# Patient Record
Sex: Female | Born: 1990 | Race: White | Hispanic: No | Marital: Single | Smoking: Former smoker
Health system: Southern US, Community
[De-identification: ages and names within clinical notes are randomized; demographics above are authoritative.]

## PROBLEM LIST (undated history)

## (undated) DIAGNOSIS — G7000 Myasthenia gravis without (acute) exacerbation: Secondary | ICD-10-CM

---

## 2020-05-16 ENCOUNTER — Inpatient Hospital Stay (HOSPITAL_COMMUNITY)
Admission: EM | Admit: 2020-05-16 | Discharge: 2020-05-22 | DRG: 056 | Disposition: A | Discharge status: Home Health | Payer: 59 | Attending: Internal Medicine | Admitting: Internal Medicine

## 2020-05-16 ENCOUNTER — Inpatient Hospital Stay (HOSPITAL_COMMUNITY): Payer: 59

## 2020-05-16 ENCOUNTER — Emergency Department (HOSPITAL_COMMUNITY): Payer: 59

## 2020-05-16 ENCOUNTER — Encounter (HOSPITAL_COMMUNITY): Payer: Self-pay

## 2020-05-16 DIAGNOSIS — J45909 Unspecified asthma, uncomplicated: Secondary | ICD-10-CM | POA: Diagnosis present

## 2020-05-16 DIAGNOSIS — I498 Other specified cardiac arrhythmias: Secondary | ICD-10-CM | POA: Diagnosis present

## 2020-05-16 DIAGNOSIS — K449 Diaphragmatic hernia without obstruction or gangrene: Secondary | ICD-10-CM | POA: Diagnosis present

## 2020-05-16 DIAGNOSIS — E46 Unspecified protein-calorie malnutrition: Secondary | ICD-10-CM | POA: Insufficient documentation

## 2020-05-16 DIAGNOSIS — Z8249 Family history of ischemic heart disease and other diseases of the circulatory system: Secondary | ICD-10-CM

## 2020-05-16 DIAGNOSIS — Z9109 Other allergy status, other than to drugs and biological substances: Secondary | ICD-10-CM

## 2020-05-16 DIAGNOSIS — Z881 Allergy status to other antibiotic agents status: Secondary | ICD-10-CM | POA: Diagnosis not present

## 2020-05-16 DIAGNOSIS — K580 Irritable bowel syndrome with diarrhea: Secondary | ICD-10-CM | POA: Diagnosis present

## 2020-05-16 DIAGNOSIS — K3184 Gastroparesis: Secondary | ICD-10-CM | POA: Diagnosis present

## 2020-05-16 DIAGNOSIS — K5900 Constipation, unspecified: Secondary | ICD-10-CM | POA: Diagnosis present

## 2020-05-16 DIAGNOSIS — E43 Unspecified severe protein-calorie malnutrition: Secondary | ICD-10-CM | POA: Diagnosis present

## 2020-05-16 DIAGNOSIS — Z87891 Personal history of nicotine dependence: Secondary | ICD-10-CM

## 2020-05-16 DIAGNOSIS — Z882 Allergy status to sulfonamides status: Secondary | ICD-10-CM

## 2020-05-16 DIAGNOSIS — M532X2 Spinal instabilities, cervical region: Secondary | ICD-10-CM | POA: Diagnosis present

## 2020-05-16 DIAGNOSIS — G7001 Myasthenia gravis with (acute) exacerbation: Principal | ICD-10-CM | POA: Diagnosis present

## 2020-05-16 DIAGNOSIS — M2669 Other specified disorders of temporomandibular joint: Secondary | ICD-10-CM | POA: Diagnosis present

## 2020-05-16 DIAGNOSIS — Z681 Body mass index (BMI) 19 or less, adult: Secondary | ICD-10-CM

## 2020-05-16 DIAGNOSIS — Z888 Allergy status to other drugs, medicaments and biological substances status: Secondary | ICD-10-CM

## 2020-05-16 DIAGNOSIS — R109 Unspecified abdominal pain: Secondary | ICD-10-CM

## 2020-05-16 DIAGNOSIS — R64 Cachexia: Secondary | ICD-10-CM | POA: Diagnosis present

## 2020-05-16 DIAGNOSIS — R1084 Generalized abdominal pain: Secondary | ICD-10-CM | POA: Diagnosis not present

## 2020-05-16 DIAGNOSIS — R5383 Other fatigue: Secondary | ICD-10-CM | POA: Diagnosis present

## 2020-05-16 DIAGNOSIS — Z79899 Other long term (current) drug therapy: Secondary | ICD-10-CM

## 2020-05-16 DIAGNOSIS — Z20822 Contact with and (suspected) exposure to covid-19: Secondary | ICD-10-CM | POA: Diagnosis present

## 2020-05-16 DIAGNOSIS — Z975 Presence of (intrauterine) contraceptive device: Secondary | ICD-10-CM

## 2020-05-16 DIAGNOSIS — N319 Neuromuscular dysfunction of bladder, unspecified: Secondary | ICD-10-CM | POA: Diagnosis present

## 2020-05-16 DIAGNOSIS — K219 Gastro-esophageal reflux disease without esophagitis: Secondary | ICD-10-CM | POA: Diagnosis present

## 2020-05-16 DIAGNOSIS — G7000 Myasthenia gravis without (acute) exacerbation: Secondary | ICD-10-CM

## 2020-05-16 DIAGNOSIS — Q7962 Hypermobile Ehlers-Danlos syndrome: Secondary | ICD-10-CM | POA: Diagnosis not present

## 2020-05-16 DIAGNOSIS — E876 Hypokalemia: Secondary | ICD-10-CM | POA: Diagnosis present

## 2020-05-16 DIAGNOSIS — T829XXA Unspecified complication of cardiac and vascular prosthetic device, implant and graft, initial encounter: Secondary | ICD-10-CM

## 2020-05-16 DIAGNOSIS — F4310 Post-traumatic stress disorder, unspecified: Secondary | ICD-10-CM | POA: Diagnosis present

## 2020-05-16 DIAGNOSIS — Z886 Allergy status to analgesic agent status: Secondary | ICD-10-CM

## 2020-05-16 DIAGNOSIS — Z885 Allergy status to narcotic agent status: Secondary | ICD-10-CM | POA: Diagnosis not present

## 2020-05-16 DIAGNOSIS — Z4902 Encounter for fitting and adjustment of peritoneal dialysis catheter: Secondary | ICD-10-CM

## 2020-05-16 DIAGNOSIS — G903 Multi-system degeneration of the autonomic nervous system: Secondary | ICD-10-CM | POA: Diagnosis present

## 2020-05-16 DIAGNOSIS — D8940 Mast cell activation, unspecified: Secondary | ICD-10-CM | POA: Diagnosis present

## 2020-05-16 DIAGNOSIS — G43909 Migraine, unspecified, not intractable, without status migrainosus: Secondary | ICD-10-CM | POA: Diagnosis present

## 2020-05-16 DIAGNOSIS — Z931 Gastrostomy status: Secondary | ICD-10-CM

## 2020-05-16 DIAGNOSIS — Z91048 Other nonmedicinal substance allergy status: Secondary | ICD-10-CM

## 2020-05-16 DIAGNOSIS — Z452 Encounter for adjustment and management of vascular access device: Secondary | ICD-10-CM

## 2020-05-16 DIAGNOSIS — G901 Familial dysautonomia [Riley-Day]: Secondary | ICD-10-CM | POA: Diagnosis present

## 2020-05-16 DIAGNOSIS — Z832 Family history of diseases of the blood and blood-forming organs and certain disorders involving the immune mechanism: Secondary | ICD-10-CM

## 2020-05-16 DIAGNOSIS — H02403 Unspecified ptosis of bilateral eyelids: Secondary | ICD-10-CM | POA: Diagnosis present

## 2020-05-16 HISTORY — DX: Myasthenia gravis without (acute) exacerbation: G70.00

## 2020-05-16 LAB — BASIC METABOLIC PANEL
Anion gap: 9 (ref 5–15)
BUN: 10 mg/dL (ref 6–20)
CO2: 27 mmol/L (ref 22–32)
Calcium: 8.5 mg/dL — ABNORMAL LOW (ref 8.9–10.3)
Chloride: 107 mmol/L (ref 98–111)
Creatinine, Ser: 0.6 mg/dL (ref 0.44–1.00)
GFR, Estimated: >60 mL/min (ref >60)
Glucose, Bld: 97 mg/dL (ref 70–99)
Potassium: 3.4 mmol/L — ABNORMAL LOW (ref 3.5–5.1)
Sodium: 143 mmol/L (ref 135–145)

## 2020-05-16 LAB — CBC WITH DIFFERENTIAL/PLATELET
Abs Immature Granulocytes: 0.02 10*3/uL (ref 0.00–0.07)
Basophils Absolute: 0 10*3/uL (ref 0.0–0.1)
Basophils Relative: 0 %
Eosinophils Absolute: 0.1 10*3/uL (ref 0.0–0.5)
Eosinophils Relative: 1 %
HCT: 30.4 % — ABNORMAL LOW (ref 36.0–46.0)
Hemoglobin: 9.5 g/dL — ABNORMAL LOW (ref 12.0–15.0)
Immature Granulocytes: 0 %
Lymphocytes Relative: 25 %
Lymphs Abs: 1.3 10*3/uL (ref 0.7–4.0)
MCH: 27.8 pg (ref 26.0–34.0)
MCHC: 31.3 g/dL (ref 30.0–36.0)
MCV: 88.9 fL (ref 80.0–100.0)
Monocytes Absolute: 0.8 10*3/uL (ref 0.1–1.0)
Monocytes Relative: 16 %
Neutro Abs: 3 10*3/uL (ref 1.7–7.7)
Neutrophils Relative %: 58 %
Platelets: 168 10*3/uL (ref 150–400)
RBC: 3.42 MIL/uL — ABNORMAL LOW (ref 3.87–5.11)
RDW: 15.1 % (ref 11.5–15.5)
WBC: 5.2 10*3/uL (ref 4.0–10.5)
nRBC: 0 % (ref 0.0–0.2)

## 2020-05-16 LAB — URINALYSIS, ROUTINE W REFLEX MICROSCOPIC
Bilirubin Urine: NEGATIVE
Glucose, UA: NEGATIVE mg/dL
Hgb urine dipstick: NEGATIVE
Ketones, ur: NEGATIVE mg/dL
Leukocytes,Ua: NEGATIVE
Nitrite: NEGATIVE
Protein, ur: NEGATIVE mg/dL
Specific Gravity, Urine: 1.011 (ref 1.005–1.030)
pH: 8 (ref 5.0–8.0)

## 2020-05-16 LAB — RESP PANEL BY RT-PCR (FLU A&B, COVID) ARPGX2
Influenza A by PCR: NEGATIVE
Influenza B by PCR: NEGATIVE
SARS Coronavirus 2 by RT PCR: NEGATIVE

## 2020-05-16 LAB — HIV ANTIBODY (ROUTINE TESTING W REFLEX): HIV Screen 4th Generation wRfx: NONREACTIVE

## 2020-05-16 LAB — HEPATIC FUNCTION PANEL
ALT: 18 U/L (ref 0–44)
AST: 18 U/L (ref 15–41)
Albumin: 3.9 g/dL (ref 3.5–5.0)
Alkaline Phosphatase: 57 U/L (ref 38–126)
Bilirubin, Direct: 0.3 mg/dL — ABNORMAL HIGH (ref 0.0–0.2)
Indirect Bilirubin: 1.5 mg/dL — ABNORMAL HIGH (ref 0.3–0.9)
Total Bilirubin: 1.8 mg/dL — ABNORMAL HIGH (ref 0.3–1.2)
Total Protein: 5.9 g/dL — ABNORMAL LOW (ref 6.5–8.1)

## 2020-05-16 LAB — PHOSPHORUS: Phosphorus: 4.3 mg/dL (ref 2.5–4.6)

## 2020-05-16 LAB — GLUCOSE, CAPILLARY
Glucose-Capillary: 110 mg/dL — ABNORMAL HIGH (ref 70–99)
Glucose-Capillary: 83 mg/dL (ref 70–99)
Glucose-Capillary: 89 mg/dL (ref 70–99)
Glucose-Capillary: 72 mg/dL (ref 70–99)

## 2020-05-16 LAB — I-STAT BETA HCG BLOOD, ED (MC, WL, AP ONLY): I-stat hCG, quantitative: <5 m[IU]/mL (ref <5)

## 2020-05-16 LAB — CREATININE, SERUM
Creatinine, Ser: 0.63 mg/dL (ref 0.44–1.00)
GFR, Estimated: >60 mL/min (ref >60)

## 2020-05-16 LAB — MAGNESIUM: Magnesium: 2.1 mg/dL (ref 1.7–2.4)

## 2020-05-16 LAB — MRSA PCR SCREENING: MRSA by PCR: NEGATIVE

## 2020-05-16 LAB — HEMOGLOBIN A1C
Hgb A1c MFr Bld: 4.7 % — ABNORMAL LOW (ref 4.8–5.6)
Mean Plasma Glucose: 88.19 mg/dL

## 2020-05-16 MED ORDER — CROMOLYN SODIUM 20 MG/2ML IN NEBU
20.0000 mg | INHALATION_SOLUTION | Freq: Three times a day (TID) | RESPIRATORY_TRACT | Status: DC
  Administered 2020-05-16 – 2020-05-21 (×14): 20 mg via RESPIRATORY_TRACT
  Filled 2020-05-16 (×16): qty 2

## 2020-05-16 MED ORDER — LORAZEPAM 0.5 MG PO TABS
0.5000 mg | ORAL_TABLET | Freq: Four times a day (QID) | ORAL | Status: DC | PRN
  Administered 2020-05-19: 0.5 mg

## 2020-05-16 MED ORDER — HEPARIN SODIUM (PORCINE) 1000 UNIT/ML IJ SOLN
1000.0000 [IU] | Freq: Once | INTRAMUSCULAR | Status: AC
  Administered 2020-05-16: 1000 [IU]

## 2020-05-16 MED ORDER — SODIUM CHLORIDE 0.9 % IV BOLUS
1000.0000 mL | Freq: Once | INTRAVENOUS | Status: AC
  Administered 2020-05-16: 1000 mL via INTRAVENOUS

## 2020-05-16 MED ORDER — LORAZEPAM 0.5 MG PO TABS
0.5000 mg | ORAL_TABLET | Freq: Four times a day (QID) | ORAL | Status: DC | PRN
  Administered 2020-05-16 – 2020-05-20 (×9): 0.5 mg via ORAL
  Filled 2020-05-16 (×10): qty 1

## 2020-05-16 MED ORDER — SODIUM CHLORIDE 0.9 % IV SOLN
Freq: Once | INTRAVENOUS | Status: DC
  Filled 2020-05-16: qty 200

## 2020-05-16 MED ORDER — DIPHENHYDRAMINE HCL 50 MG/ML IJ SOLN: 25.0000 mg | Freq: Four times a day (QID) | INTRAMUSCULAR | Status: DC | PRN

## 2020-05-16 MED ORDER — ONDANSETRON HCL 4 MG/2ML IJ SOLN
INTRAMUSCULAR | Status: AC
  Administered 2020-05-16: 4 mg via INTRAVENOUS
  Filled 2020-05-16: qty 4

## 2020-05-16 MED ORDER — HEPARIN SODIUM (PORCINE) 1000 UNIT/ML IJ SOLN: 1000.0000 [IU] | Freq: Once | INTRAMUSCULAR | Status: DC

## 2020-05-16 MED ORDER — LORAZEPAM 0.5 MG PO TABS
0.5000 mg | ORAL_TABLET | Freq: Four times a day (QID) | ORAL | Status: DC | PRN
  Filled 2020-05-16: qty 1

## 2020-05-16 MED ORDER — BACLOFEN 10 MG PO TABS
10.0000 mg | ORAL_TABLET | Freq: Four times a day (QID) | ORAL | Status: DC | PRN
  Administered 2020-05-17 – 2020-05-18 (×4): 10 mg via ORAL
  Filled 2020-05-16 (×3): qty 1

## 2020-05-16 MED ORDER — CROMOLYN SODIUM 100 MG/5ML PO CONC
200.0000 mg | ORAL | Status: DC
  Administered 2020-05-16: 200 mg via ORAL

## 2020-05-16 MED ORDER — LORAZEPAM 0.5 MG PO TABS
0.5000 mg | ORAL_TABLET | Freq: Four times a day (QID) | ORAL | Status: DC | PRN
  Administered 2020-05-16: 0.5 mg

## 2020-05-16 MED ORDER — INSULIN ASPART 100 UNIT/ML ~~LOC~~ SOLN: 0.0000 [IU] | SUBCUTANEOUS | Status: DC

## 2020-05-16 MED ORDER — MONTELUKAST SODIUM 10 MG PO TABS
10.0000 mg | ORAL_TABLET | Freq: Every day | ORAL | Status: DC
  Filled 2020-05-16: qty 1

## 2020-05-16 MED ORDER — POLYETHYLENE GLYCOL 3350 17 G PO PACK: 17.0000 g | PACK | Freq: Every day | ORAL | Status: DC | PRN

## 2020-05-16 MED ORDER — KETOTIFEN FUMARATE 0.025 % OP SOLN
1.0000 [drp] | Freq: Every day | OPHTHALMIC | Status: DC | PRN
  Administered 2020-05-18: 1 [drp] via OPHTHALMIC
  Filled 2020-05-16: qty 5

## 2020-05-16 MED ORDER — EPINEPHRINE 0.3 MG/0.3ML IJ SOAJ
0.3000 mg | INTRAMUSCULAR | Status: DC | PRN
  Filled 2020-05-16: qty 0.6

## 2020-05-16 MED ORDER — CALCIUM GLUCONATE 10 % IV SOLN
2.0000 g | Freq: Once | INTRAVENOUS | Status: DC
  Filled 2020-05-16: qty 20

## 2020-05-16 MED ORDER — MEPIVACAINE HCL (PF) 1 % IJ SOLN: 15.0000 mL | Freq: Once | INTRAMUSCULAR | Status: DC

## 2020-05-16 MED ORDER — FLUTICASONE-SALMETEROL 250-50 MCG/DOSE IN AEPB
1.0000 | INHALATION_SPRAY | Freq: Two times a day (BID) | RESPIRATORY_TRACT | Status: DC
  Administered 2020-05-17 – 2020-05-21 (×9): 1 via RESPIRATORY_TRACT
  Filled 2020-05-16: qty 14

## 2020-05-16 MED ORDER — ACD FORMULA A 0.73-2.45-2.2 GM/100ML VI SOLN
Status: AC
  Administered 2020-05-16: 1000 mL
  Filled 2020-05-16: qty 500

## 2020-05-16 MED ORDER — ZAFIRLUKAST 20 MG PO TABS
20.0000 mg | ORAL_TABLET | Freq: Two times a day (BID) | ORAL | Status: DC
  Administered 2020-05-17 – 2020-05-21 (×10): 20 mg via ORAL
  Filled 2020-05-16 (×10): qty 1

## 2020-05-16 MED ORDER — LORAZEPAM 2 MG/ML IJ SOLN
INTRAMUSCULAR | Status: AC
  Administered 2020-05-16: 0.5 mg
  Filled 2020-05-16: qty 1

## 2020-05-16 MED ORDER — DOCUSATE SODIUM 100 MG PO CAPS: 100.0000 mg | ORAL_CAPSULE | Freq: Two times a day (BID) | ORAL | Status: DC | PRN

## 2020-05-16 MED ORDER — ACETAMINOPHEN 325 MG PO TABS: 650.0000 mg | ORAL_TABLET | ORAL | Status: DC | PRN

## 2020-05-16 MED ORDER — LORAZEPAM 0.5 MG PO TABS: 0.5000 mg | ORAL_TABLET | Freq: Four times a day (QID) | ORAL | Status: DC | PRN

## 2020-05-16 MED ORDER — PYRIDOSTIGMINE BROMIDE 60 MG PO TABS
120.0000 mg | ORAL_TABLET | ORAL | Status: DC
  Administered 2020-05-16: 120 mg via ORAL

## 2020-05-16 MED ORDER — CALCIUM GLUCONATE 10 % IV SOLN
3.0000 g | Freq: Once | INTRAVENOUS | Status: DC
  Filled 2020-05-16: qty 30

## 2020-05-16 MED ORDER — ACD FORMULA A 0.73-2.45-2.2 GM/100ML VI SOLN
1000.0000 mL | Status: DC
  Filled 2020-05-16: qty 1000

## 2020-05-16 MED ORDER — SODIUM CHLORIDE 0.9 % IV SOLN
3.0000 g | Freq: Once | INTRAVENOUS | Status: AC
  Administered 2020-05-16: 3 g via INTRAVENOUS
  Filled 2020-05-16: qty 30

## 2020-05-16 MED ORDER — PYRIDOSTIGMINE BROMIDE 60 MG PO TABS: 120.0000 mg | ORAL_TABLET | ORAL | Status: DC

## 2020-05-16 MED ORDER — BUDESONIDE 0.5 MG/2ML IN SUSP
0.5000 mg | Freq: Two times a day (BID) | RESPIRATORY_TRACT | Status: DC
  Administered 2020-05-16 – 2020-05-19 (×4): 0.5 mg via RESPIRATORY_TRACT
  Filled 2020-05-16 (×9): qty 2

## 2020-05-16 MED ORDER — VITAMIN B-12 1000 MCG PO TABS
1000.0000 ug | ORAL_TABLET | Freq: Every day | ORAL | Status: DC
  Filled 2020-05-16 (×2): qty 1

## 2020-05-16 MED ORDER — FAMOTIDINE IN NACL 20-0.9 MG/50ML-% IV SOLN
20.0000 mg | Freq: Two times a day (BID) | INTRAVENOUS | Status: DC
  Administered 2020-05-16 – 2020-05-17 (×2): 20 mg via INTRAVENOUS
  Filled 2020-05-16 (×2): qty 50

## 2020-05-16 MED ORDER — PYRIDOSTIGMINE BROMIDE 60 MG PO TABS
90.0000 mg | ORAL_TABLET | ORAL | Status: DC
  Administered 2020-05-16 – 2020-05-21 (×43): 90 mg via ORAL
  Filled 2020-05-16 (×42): qty 1.5

## 2020-05-16 MED ORDER — CROMOLYN SODIUM 5.2 MG/ACT NA AERS
1.0000 | INHALATION_SPRAY | Freq: Three times a day (TID) | NASAL | Status: DC | PRN
  Filled 2020-05-16: qty 26

## 2020-05-16 MED ORDER — ACETAMINOPHEN 325 MG PO TABS
650.0000 mg | ORAL_TABLET | ORAL | Status: DC | PRN
  Filled 2020-05-16: qty 2

## 2020-05-16 MED ORDER — HEPARIN SODIUM (PORCINE) 5000 UNIT/ML IJ SOLN
5000.0000 [IU] | Freq: Three times a day (TID) | INTRAMUSCULAR | Status: DC
  Administered 2020-05-17: 5000 [IU] via SUBCUTANEOUS
  Filled 2020-05-16 (×6): qty 1

## 2020-05-16 MED ORDER — DIPHENHYDRAMINE-ZINC ACETATE 2-0.1 % EX CREA
1.0000 "application " | TOPICAL_CREAM | Freq: Every day | CUTANEOUS | Status: DC | PRN
  Filled 2020-05-16: qty 28

## 2020-05-16 MED ORDER — DIPHENHYDRAMINE HCL 25 MG PO CAPS: 25.0000 mg | ORAL_CAPSULE | Freq: Four times a day (QID) | ORAL | Status: DC | PRN

## 2020-05-16 MED ORDER — PYRIDOSTIGMINE BROMIDE 60 MG PO TABS
90.0000 mg | ORAL_TABLET | ORAL | Status: DC
  Filled 2020-05-16 (×3): qty 1.5

## 2020-05-16 MED ORDER — MEPIVACAINE HCL (PF) 2 % IJ SOLN
10.0000 mL | Freq: Once | INTRAMUSCULAR | Status: AC
  Administered 2020-05-16: 10 mL
  Filled 2020-05-16: qty 20

## 2020-05-16 MED ORDER — IPRATROPIUM-ALBUTEROL 0.5-2.5 (3) MG/3ML IN SOLN
3.0000 mL | Freq: Four times a day (QID) | RESPIRATORY_TRACT | Status: DC
  Administered 2020-05-16 – 2020-05-17 (×2): 3 mL via RESPIRATORY_TRACT
  Filled 2020-05-16 (×3): qty 3

## 2020-05-16 MED ORDER — CETIRIZINE HCL 10 MG PO TABS
10.0000 mg | ORAL_TABLET | Freq: Two times a day (BID) | ORAL | Status: DC
  Filled 2020-05-16: qty 1

## 2020-05-16 MED ORDER — CALCIUM GLUCONATE-NACL 2-0.675 GM/100ML-% IV SOLN
2.0000 g | Freq: Once | INTRAVENOUS | Status: DC
  Filled 2020-05-16: qty 100

## 2020-05-16 MED ORDER — SODIUM CHLORIDE 0.9 % IV SOLN
3.0000 g | Freq: Once | INTRAVENOUS | Status: DC
  Filled 2020-05-16: qty 30

## 2020-05-16 MED ORDER — SODIUM CHLORIDE 0.9 % IV SOLN
INTRAVENOUS | Status: AC
  Filled 2020-05-16 (×3): qty 200

## 2020-05-16 MED ORDER — CROMOLYN SODIUM 100 MG/5ML PO CONC
200.0000 mg | Freq: Three times a day (TID) | ORAL | Status: DC
  Administered 2020-05-16 – 2020-05-18 (×10): 200 mg via ORAL
  Filled 2020-05-16 (×10): qty 10

## 2020-05-16 MED ORDER — IVABRADINE HCL 5 MG PO TABS
5.0000 mg | ORAL_TABLET | Freq: Two times a day (BID) | ORAL | Status: DC
  Administered 2020-05-17 – 2020-05-21 (×10): 5 mg via ORAL
  Filled 2020-05-16 (×12): qty 1

## 2020-05-16 MED ORDER — BACLOFEN 10 MG PO TABS
10.0000 mg | ORAL_TABLET | Freq: Three times a day (TID) | ORAL | Status: DC | PRN
  Administered 2020-05-16 (×2): 10 mg via ORAL
  Filled 2020-05-16 (×3): qty 1

## 2020-05-16 MED ORDER — PYRIDOSTIGMINE BROMIDE 60 MG PO TABS
157.5000 mg | ORAL_TABLET | Freq: Once | ORAL | Status: AC
  Administered 2020-05-16: 150 mg via ORAL
  Filled 2020-05-16: qty 2.5

## 2020-05-16 MED ORDER — ONDANSETRON HCL 4 MG/2ML IJ SOLN
4.0000 mg | Freq: Four times a day (QID) | INTRAMUSCULAR | Status: DC | PRN
  Administered 2020-05-17: 4 mg via INTRAVENOUS
  Filled 2020-05-16: qty 2

## 2020-05-16 MED ORDER — VITAMIN B-12 100 MCG PO TABS: 100.0000 ug | ORAL_TABLET | Freq: Every day | ORAL | Status: DC

## 2020-05-16 MED ORDER — DIPHENHYDRAMINE HCL 25 MG PO CAPS
25.0000 mg | ORAL_CAPSULE | Freq: Four times a day (QID) | ORAL | Status: DC | PRN
  Administered 2020-05-18 – 2020-05-21 (×4): 25 mg via ORAL
  Filled 2020-05-16 (×2): qty 1

## 2020-05-16 MED ORDER — HEPARIN SODIUM (PORCINE) 1000 UNIT/ML IJ SOLN
INTRAMUSCULAR | Status: AC
  Filled 2020-05-16: qty 4

## 2020-05-16 MED ORDER — LORATADINE 10 MG PO TABS: 10.0000 mg | ORAL_TABLET | Freq: Every day | ORAL | Status: DC

## 2020-05-16 NOTE — H&P (Signed)
See consult note from today

## 2020-05-16 NOTE — Procedures (Signed)
Central Venous Catheter Insertion Procedure Note  Nazirah Tri  [id number]  03/17/1991  Date:05/16/20  Time:4:00 PM   Provider Performing:Taiten Brawn   Procedure: Insertion of Non-tunneled Central Venous Catheter(36556)with US guidance (83382)    Indication(s) Hemodialysis  Consent Risks of the procedure as well as the alternatives and risks of each were explained to the patient and/or caregiver.  Consent for the procedure was obtained and is signed in the bedside chart  Anesthesia Topical only with 1% lidocaine   Timeout Verified patient identification, verified procedure, site/side was marked, verified correct patient position, special equipment/implants available, medications/allergies/relevant history reviewed, required imaging and test results available.  Sterile Technique Maximal sterile technique including full sterile barrier drape, hand hygiene, sterile gown, sterile gloves, mask, hair covering, sterile ultrasound probe cover (if used).  Procedure Description Area of catheter insertion was cleaned with chlorhexidine and draped in sterile fashion.   With real-time ultrasound guidance a HD catheter was placed into the left subclavian vein.  Nonpulsatile blood flow and easy flushing noted in all ports.  The catheter was sutured in place and sterile dressing applied.  Complications/Tolerance None; patient tolerated the procedure well. Chest X-ray is ordered to verify placement for internal jugular or subclavian cannulation.  Chest x-ray is not ordered for femoral cannulation.  EBL Minimal  Specimen(s) None

## 2020-05-16 NOTE — Progress Notes (Signed)
Patient very upset and agitated that her medications are being stored in pharmacy. PTA medication policy explained to patient at time of medication inventory earlier this evening, witnessed by HD nurse M. Herriott, Charity fundraiser. Patient gave verbal consent for medication storage and stated that she could not physically sign as she felt too weak. Dual nurse signature to verify verbal consent can be found on medication receipt.  Aris Lot, RN

## 2020-05-16 NOTE — Progress Notes (Signed)
Patient to 4N17 at 1132, patient a/o x4 extremely anxious and tearful on arrival. Will inventory belongings after patient is stabilized. Patient refusing CHG bath and mepilex sacral foam, states that her skin is very sensitive and it will cause irritation. Patient requesting no scents (perfumes, scented shampoos, etc) in her room.  Aris Lot, RN

## 2020-05-16 NOTE — Progress Notes (Signed)
Pt refused to take the scheduled aerosol neb treatment at this time. No distress noted, SpO2 98% on 2L. NIF performed at this time -25 x3 attempts with good effort. RT will continue to monitor.

## 2020-05-16 NOTE — Progress Notes (Signed)
Pt removed mask for breathing treatment from face and liquid medication appears to be on bed. RT notified. Unsure how much medication patient actually received. RN will continue to monitor.   Pt is also refusing all medications because some are missing and they are off of her home schedule. She would like her home pill box to take them. This RN explained that pharmacy has reviewed her medications that were sent down and orders were placed accordingly. Pt states "I will not take anything until I talk to pharmacy." RN will continue to monitor

## 2020-05-16 NOTE — Progress Notes (Signed)
Pr is very emotional and it was hard giving her neb treatments. She did take some neb tx and I will try to finish it again. She does not want to do the NIF at all. Rt will try again. RN aware.

## 2020-05-16 NOTE — Progress Notes (Signed)
Patient with following belongings at bedside: One beanie hat One pair pants Two pair short sleeve button down shirts One long sleeve button down shirt One cell phone Two tablets One binder notebook One notebook Misc papers One respirator with hardshell black case One respirator with eye protection in hardshell black case One blue messenger bag Misc PTA medications, taken to pharmacy One eyeglass case, eyeglasses on patient  All belongings aside from PTA medications at bedside per patient request. Receipt for medications taken to pharmacy in patient chart.  Aris Lot, RN

## 2020-05-16 NOTE — ED Provider Notes (Signed)
[facility] EMERGENCY DEPARTMENT Provider Note   CSN: [id number] Arrival date & time: 05/16/20  1275     History Chief Complaint  Patient presents with  . Multiple Complaints  . Social Work    Brandy [name] is a 29 y.o. female.  HPI   29 year old female with a history of mast cell activation disorder, POTS syndrome, neurogenic orthostatic hypotension, chronic fatigue syndrome, hypermobile Ehlers-Danlos, TMJ dysfunction, carpal tunnel, scapular dyskinesis, asthma, IBS, vertigo, malabsorption, PTSD, GERD, hiatal hernia, migraines, neurogenic bladder, cervical spine instability, Gilbert's syndrome, myasthenia gravis, who presents to the emergency department today for evaluation of worsening myasthenia gravis symptoms.  States that she moved here 1 week ago from [place].  For the last month she has had increasing symptoms from her myasthenia including difficulty swallowing and difficulty with proximal muscle weakness.  Her physician recently started her on a course of steroids about 1 week ago which was later increased.  Initially steroids were helping symptoms however now they are not and it has become more difficult to swallow.  She reports increased fatigue and difficulty breathing.  She has never required intubation for her myasthenia in the past.  She was just diagnosed with this earlier this year.  Denies fevers, uri sxs, cough, chest pain, nausea vomiting or urinary sxs. Has had some alternating diarrhea and constipation but has h/o IBS.   Past Medical History:  Diagnosis Date  . Myasthenia gravis [facility])     Patient Active Problem List   Diagnosis Date Noted  . Myasthenia exacerbation (HCC) 05/16/2020    History reviewed. No pertinent surgical history.   OB History   No obstetric history on file.     No family history on file.  Social History   Tobacco Use  . Smoking status: Not on file  Substance Use Topics  . Alcohol use: Not on file  . Drug use:  Not on file    Home Medications Prior to Admission medications   Medication Sig Start Date End Date Taking? Authorizing Provider  baclofen (LIORESAL) 10 MG tablet Take 10 mg by mouth See admin instructions. Take one 10 mg tablet four times a day at 6AM, 12PM, 6PM, and 12AM   Yes [provider]  cromolyn (GASTROCROM) 100 MG/5ML solution Take 100 mg by mouth See admin instructions. 2 ampule in water taken 4 times a day before meals/bed and PRN   Yes [provider]  cromolyn (INTAL) 20 MG/2ML nebulizer solution Take 20 mg by nebulization See admin instructions. 1 vial taken 3 times a day and PRN   Yes [provider]  diphenhydrAMINE-zinc acetate (BENADRYL) cream Apply 1-2 application topically daily as needed (Rash from Mast Cell and Allergies).   Yes [provider]  famotidine (PEPCID) 40 MG tablet Take 40 mg by mouth See admin instructions. Take one 40 mg tablet twice a day at 6AM and 6PM.   Yes [provider]  ketotifen (ZADITOR) 0.025 % ophthalmic solution Place 1 drop into both eyes daily as needed (Mast cell, Allergies, MCS/EI).   Yes [provider]  LORazepam (ATIVAN) 0.5 MG tablet Take 0.5 mg by mouth every 6 (six) hours as needed for anxiety.   Yes [provider]  ondansetron (ZOFRAN-ODT) 8 MG disintegrating tablet Take 8 mg by mouth every 8 (eight) hours as needed for nausea or vomiting.   Yes [provider]    Allergies    Aspirin, Nsaids, Quinolones, Sulfa antibiotics, Telithromycin, Fludrocortisone, Ambien [zolpidem], Botulinum toxins,  Codeine, Creon [pancrelipase (lip-prot-amyl)], Cymbalta [duloxetine hcl], Dextrans, Ergotamine, Gabapentin, Gallamine, Hydrocodone, Lunesta [eszopiclone], Macrolides and ketolides, Maprotiline, Marplan [isocarboxazid], Midodrine, Morphine and related, Nitrous oxide, Oxycodone, Pethidine [meperidine], Phenergan [promethazine], Phenobarbital, Pyridium [phenazopyridine], Reglan  [metoclopramide], Remeron [mirtazapine], Savella [milnacipran], Scopolamine, Sumatriptan, Tape, Topiramate, Tricyclic antidepressants, and Vancomycin  Review of Systems   Review of Systems  Constitutional: Positive for fatigue. Negative for fever.  HENT: Positive for trouble swallowing. Negative for sore throat.   Eyes: Negative for visual disturbance.  Respiratory: Negative for cough and shortness of breath.   Cardiovascular: Negative for chest pain.  Gastrointestinal: Positive for diarrhea. Negative for abdominal pain, constipation, nausea and vomiting.  Genitourinary: Negative for dysuria and hematuria.  Musculoskeletal: Negative for back pain.  Skin: Negative for rash.  Neurological: Positive for speech difficulty and weakness. Negative for syncope.  All other systems reviewed and are negative.   Physical Exam Updated Vital Signs BP 131/87   Pulse (!) 59   Temp 97.9 F (36.6 C) (Oral)   Resp 16   Ht  (1.626 m)   Wt 49.9 kg   SpO2 100%   BMI 18.88 kg/m   Physical Exam Vitals and nursing note reviewed.  Constitutional:      General: She is not in acute distress.    Appearance: She is well-developed.     Comments: Chronically ill appearing  HENT:     Head: Normocephalic and atraumatic.  Eyes:     Conjunctiva/sclera: Conjunctivae normal.     Comments: Left eye ptosis  Neck:     Comments: In ccollar Cardiovascular:     Rate and Rhythm: Normal rate and regular rhythm.     Heart sounds: Normal heart sounds.  Pulmonary:     Effort: Pulmonary effort is normal. No respiratory distress.     Breath sounds: Normal breath sounds.  Abdominal:     General: Bowel sounds are normal.     Palpations: Abdomen is soft.     Tenderness: There is no abdominal tenderness.  Musculoskeletal:     Cervical back: Neck supple.  Skin:    General: Skin is warm and dry.  Neurological:     Mental Status: She is alert.     Comments: Mental Status:  Alert, thought content appropriate,  able to give a coherent history. Speech fluent without evidence of aphasia. Able to follow 2 step commands without difficulty.  Motor:  Normal tone. Globally decrease strength which is somewhat worse on the left (states left is weaker x1 month) Sensory: light touch normal in all extremities.      ED Results / Procedures / Treatments   Labs (all labs ordered are listed, but only abnormal results are displayed) Labs Reviewed  CBC WITH DIFFERENTIAL/PLATELET - Abnormal; Notable for the following components:      Result Value   RBC 3.42 (*)    Hemoglobin 9.5 (*)    HCT 30.4 (*)    All other components within normal limits  BASIC METABOLIC PANEL - Abnormal; Notable for the following components:   Potassium 3.4 (*)    Calcium 8.5 (*)    All other components within normal limits  URINALYSIS, ROUTINE W REFLEX MICROSCOPIC - Abnormal; Notable for the following components:   APPearance CLOUDY (*)    All other components within normal limits  RESP PANEL BY RT-PCR (FLU A&B, COVID) ARPGX2  HIV ANTIBODY (ROUTINE TESTING W REFLEX)  CREATININE, SERUM  HEMOGLOBIN A1C  HEPATIC FUNCTION PANEL  MAGNESIUM  PHOSPHORUS  I-STAT BETA HCG  BLOOD, ED ([facility], [facility], [facility] ONLY)    EKG None  Radiology DG Chest Portable 1 View  Result Date: 05/16/2020 CLINICAL DATA:  Difficulty swallowing and muscle weakness. EXAM: PORTABLE CHEST 1 VIEW COMPARISON:  None. FINDINGS: There is a right chest wall port a catheter with tip in the distal SVC. Heart size normal. No pleural effusion or edema identified. No airspace densities identified. IMPRESSION: No active disease. Electronically Signed   By: Signa Kell M.D.   On: 05/16/2020 08:32    Procedures Procedures (including critical care time)  Medications Ordered in ED Medications  docusate sodium (COLACE) capsule 100 mg (has no administration in time range)  polyethylene glycol (MIRALAX / GLYCOLAX) packet 17 g (has no administration in time range)  heparin  injection 5,000 Units (has no administration in time range)  ipratropium-albuterol (DUONEB) 0.5-2.5 (3) MG/3ML nebulizer solution 3 mL (3 mLs Nebulization Not Given 05/16/20 1220)  insulin aspart (novoLOG) injection 0-6 Units (has no administration in time range)  cromolyn (GASTROCROM) solution 100 mg (has no administration in time range)  famotidine (PEPCID) IVPB 20 mg premix (has no administration in time range)  ondansetron (ZOFRAN) injection 4 mg (4 mg Intravenous Given 05/16/20 1319)  baclofen (LIORESAL) tablet 10 mg (has no administration in time range)  cromolyn (INTAL) nebulizer solution 20 mg (has no administration in time range)  diphenhydrAMINE-zinc acetate (BENADRYL) 2-0.1 % cream 1-2 application (has no administration in time range)  ketotifen (ZADITOR) 0.025 % ophthalmic solution 1 drop (has no administration in time range)  budesonide (PULMICORT) nebulizer solution 0.5 mg (has no administration in time range)  montelukast (SINGULAIR) tablet 10 mg (has no administration in time range)  loratadine (CLARITIN) tablet 10 mg (has no administration in time range)  diphenhydrAMINE (BENADRYL) injection 25 mg (has no administration in time range)  pyridostigmine (MESTINON) tablet 120 mg (has no administration in time range)  LORazepam (ATIVAN) tablet 0.5 mg (has no administration in time range)    Or  LORazepam (ATIVAN) tablet 0.5 mg (has no administration in time range)  sodium chloride 0.9 % bolus 1,000 mL (1,000 mLs Intravenous New Bag/Given 05/16/20 0934)  pyridostigmine (MESTINON) tablet 150 mg (150 mg Oral Given 05/16/20 1026)    ED Course  I have reviewed the triage vital signs and the nursing notes.  Pertinent labs & imaging results that were available during my care of the patient were reviewed by me and considered in my medical decision making (see chart for details).    MDM Rules/Calculators/A&P                          29 year old female with history of myasthenia  gravis presenting here for concern for myasthenia flare ongoing for the last month.  Currently already on steroids that have been increased over the last week with no resolution or improvement of symptoms.  Complaining of difficulty swallowing, difficulty breathing.  Has never been intubated for myasthenia.  Reviewed/interpreted labs CBC with no leukocytosis but anemia present BMP with mild hypokalemia otherwise reassuring Covid negative Beta-hCG negative UA negative  Chest x-ray without acute findings  8:34 AM CONSULT with Dr. Rockne Coons with neurology who recommends nif and vital capacity. He will come see the patient in the ED.   9:06 AM NIF -20 cmH2O first try 2nd try was -18 cmH2O with good effort, patient was sitting up in bed.  - will consult ICU  9:36 AM CONSULT with Brandy, critical care APP. ICU will  come see pt. They recommend holding off on bipap until they evaluate patient.   ICU to admit patient.   Final Clinical Impression(s) / ED Diagnoses Final diagnoses:  Myasthenia exacerbation [facility])    Rx / DC Orders ED Discharge Orders    None       Karrie Meres, PA-C 05/16/20 1338    Margarita Grizzle, MD 05/18/20 1208

## 2020-05-16 NOTE — Progress Notes (Signed)
RT went in the room again to check on pt to see if she would do her NIF and VC she refused. Some of the neb tx still in the cup tried to give to pt but she started crying and said all she wants is her meds. She is refusing everything.

## 2020-05-16 NOTE — Progress Notes (Signed)
Patient performed NIF -20 cmH2O first try 2nd try was -18 cmH2O with good effort, patient was sitting up in bed.

## 2020-05-16 NOTE — ED Triage Notes (Signed)
Pt comes from home via [facility] for multiple complaints, pt just moved here and does not have PCP, pt states that she has difficult swallowing and muscle weakness, hx of Myasthenia Gravis. Pt states that she called 911 because she needs a caregiver at home

## 2020-05-16 NOTE — Consult Note (Addendum)
NEUROLOGY CONSULTATION NOTE   Date of service: May 16, 2020 Patient Name: Brandy [name] MRN:  [id number] DOB:  03/16/1991 Reason for consult: "Myasthenia exacerbation"  History of Present Illness  Brandy [name] is a 29 y.o. female with PMH significant for mast cell activation syndrome, Ehler danlos with cervical instability, POTS, seronegative myasthenia with inconclusive LRP4 Ab who presents with drooping eyelids BL (left worse than right) and shortness of breath and only able to speak in short phrases. Symptoms have been gradually progressive over the last month, worsened over the weekend. She moved from [place] to [place] on 11/23 and feels that the move may have worsened it for her. She initially reached out to her neurologist down in [place] and her Prednisone was increased from 20mg  daily to 25mg  daily. That did not help. She is on very high doses of mestinon, infact, its the highest I have ever seen. She currently takes 135mg  mestinon about 8 times a day and has been doing that for months.  She denies any other medication changes recently   ROS   Constitutional Denies weight loss, fever and chills.  HEENT Denies changes in vision and hearing.  Respiratory Denies SOB and cough.  CV Denies palpitations and CP  GI Denies abdominal pain, nausea, vomiting and diarrhea.  GU Denies dysuria and urinary frequency.  MSK Denies myalgia and joint pain.  Skin Denies rash and pruritus.  Neurological Denies headache and syncope.  Psychiatric Denies recent changes in mood. Denies anxiety and depression.   Past History   Past Medical History:  Diagnosis Date  . Myasthenia gravis (HCC)    History reviewed. No pertinent surgical history. No family history on file. Social History   Socioeconomic History  . Marital status: Single    Spouse name: Not on file  . Number of children: Not on file  . Years of education: Not on file  . Highest education level: Not on file  Occupational  History  . Not on file  Tobacco Use  . Smoking status: Not on file  Substance and Sexual Activity  . Alcohol use: Not on file  . Drug use: Not on file  . Sexual activity: Not on file  Other Topics Concern  . Not on file  Social History Narrative  . Not on file   Social Determinants of Health   Financial Resource Strain:   . Difficulty of Paying Living Expenses: Not on file  Food Insecurity:   . Worried About in the Last Year: Not on file  . Ran Out of Food in the Last Year: Not on file  Transportation Needs:   . Lack of Transportation (Medical): Not on file  . Lack of Transportation (Non-Medical): Not on file  Physical Activity:   . Days of Exercise per Week: Not on file  . Minutes of Exercise per Session: Not on file  Stress:   . Feeling of Stress : Not on file  Social Connections:   . Frequency of Communication with Friends and Family: Not on file  . Frequency of Social Gatherings with Friends and Family: Not on file  . Attends Religious Services: Not on file  . Active Member of Clubs or Organizations: Not on file  . Attends Meetings: Not on file  . Marital Status: Not on file   Not on File  Medications  (Not in a hospital admission)    Vitals   Vitals:   05/16/20 Programme researcher, broadcasting/film/video 05/16/20 0810 05/16/20 8182  BP: 116/84 124/75   Pulse: 74 63   Resp: (!) 21 (!) 21   Temp: 98.3 F (36.8 C)    TempSrc: Oral    SpO2: 100% 100% 100%     There is no height or weight on file to calculate BMI.  Physical Exam   General: Laying comfortably in bed; C collar in place. HENT: Normal oropharynx and mucosa. Normal external appearance of ears and nose. Neck: Supple, no pain or tenderness CV: No JVD. No peripheral edema. Pulmonary: Symmetric Chest rise. Normal respiratory effort. Abdomen: Soft to touch, non-tender. Ext: No cyanosis, edema, or deformity Skin: No rash. Normal palpation of skin.  Musculoskeletal: Normal digits and nails by  inspection. No clubbing.  Neurologic Examination  Mental status/Cognition: Alert, oriented to self, place, month and year, good attention. Speech/language: Soft whispery voice and has to pause every 2-3 words to catch her breath. Fluent, comprehension intact, object naming intact, repetition intact. Cranial nerves:   CN II Pupils equal and reactive to light, no VF deficits   CN III,IV,VI EOM intact, no gaze preference or deviation, no nystagmus. BL Ptosis with L worse than Right   CN V normal sensation in V1, V2, and V3 segments bilaterally   CN VII no asymmetry, no nasolabial fold flattening. Facial diplegia.   CN VIII normal hearing to speech   CN IX & X Very mild palatal elevation, no uvular deviation   CN XI Did not assess due to C collar in place.   CN XII midline tongue protrusion   Motor:  Muscle bulk: poor, tone normal, tremor none Mvmt Root Nerve  Muscle Right Left Comments  SA C5/6 Ax Deltoid 4+ -   EF C5/6 Mc Biceps 4+ 4+   EE C6/7/8 Rad Triceps 4+ 4+   WF C6/7 Med FCR 4+ 4+   WE C7/8 PIN ECU 4+ 4+   F Ab C8/T1 U ADM/FDI 4+ 4+   HF L1/2/3 Fem Illopsoas 3 3   KE L2/3/4 Fem Quad 3 3   DF L4/5 D Peron Tib Ant 4 4   PF S1/2 Tibial Grc/Sol 4 4    Reflexes:  Right Left Comments  Pectoralis      Biceps (C5/6) 2 2   Brachioradialis (C5/6) 2 2    Triceps (C6/7) 2 2    Patellar (L3/4) 2 2    Achilles (S1) 2 2    Hoffman      Plantar     Jaw jerk    Sensation:  Light touch Intact throughout   Pin prick    Temperature    Vibration   Proprioception    Coordination/Complex Motor:  - Finger to Nose intact BL - Heel to shin unable to assess due to weakness.  Labs   CBC:  Recent Labs  Lab 05/16/20 0733  WBC 5.2  NEUTROABS 3.0  HGB 9.5*  HCT 30.4*  MCV 88.9  PLT 168    Basic Metabolic Panel:  Lab Results  Component Value Date   NA 143 05/16/2020   K 3.4 (L) 05/16/2020   CO2 27 05/16/2020   GLUCOSE 97 05/16/2020   BUN 10 05/16/2020   CREATININE 0.60  05/16/2020   CALCIUM 8.5 (L) 05/16/2020   GFRNONAA >60 05/16/2020   Lipid Panel: No results found for: LDLCALC HgbA1c: No results found for: HGBA1C Urine Drug Screen: No results found for: LABOPIA, COCAINSCRNUR, LABBENZ, AMPHETMU, THCU, LABBARB  Alcohol Level No results found for: ETH   No results found for  this or any previous visit.    Impression   Brandy [name] is a 29 y.o. female with PMH significant for mast cell activation syndrome, Ehler danlos with cervical instability, POTS, seronegative myasthenia with inconclusive LRP4 Ab who presents with myasthenia exacerbation. Her neurologic examination is notable for drooping eyelids, facial diplegia, muffled voice, shortness of breath and weakness with left worse than right. With her prior hx of seronegative myasthenia, symptoms are concerning for myasthenia exacerbation. No clear symptoms of infection that may have triggered this, no recent medication changes except for increase in steroids to 25mg  daily from 20mg  daily but that was after worsening of her symptoms. I suspect that the stress of moving may have contributed to this.  Recommendations  - PLEX 0.4g/Kg every other day x 5 doses ordered. (With her hx of Mast Cell Activation Syndrome, IVIG may not be a good idea) - Every 4 hours NIFs and VC - NPO until swallow eval - Recommend elective intubation for respiratory compromise if VC falls below 15 to 20 mL/kg and or NIFs falls below -20cm/H2O. If poor effort and not sure, can always get ABG to assess for CO2 retention. Elective intubation for airway cmopromise if she has difficulty clearing her secretions. Oxygen saturation should not be used to make decision regarding intubation. - Recommend continuing Mestinon PO 135mg  every 3 hours. If unable to swallow, can switch from PO to IV.(30mg  PO is equivalent to 1mg  IV). - Medications that may worsen or trigger MG exacerbation: Class IA antiarrhythmics, magnesium, flouroquinolones, macrolides,  aminoglycosides, penicillamine, curare, interferon alpha, botox, quinine. Use with caution: calcium channel blocker, beta blockers and statins.  ______________________________________________________________________  This patient is critically ill and at significant risk of neurological worsening, death and care requires constant monitoring of vital signs, hemodynamics,respiratory and cardiac monitoring, neurological assessment, discussion with family, other specialists and medical decision making of high complexity. I spent 40 minutes of neurocritical care time  in the care of  this patient. This was time spent independent of any time provided by nurse practitioner or PA.  [facility] Pager Number 05/16/2020  12:12 PM   Thank you for the opportunity to take part in the care of this patient. If you have any further questions, please contact the neurology consultation attending.  Signed,  [facility] Pager Number 

## 2020-05-16 NOTE — Consult Note (Signed)
NAME:  Brandy [name], MRN:  [id number], DOB:  07-03-1990, LOS: 0 ADMISSION DATE:  05/16/2020, CONSULTATION DATE:  05/16/20 REFERRING MD:  Jeanell Sparrow, CHIEF COMPLAINT:  weakness   Brief History   29 year old woman with hx of MG, Ehler Danlos w/ cervical instability, dysautonomia, wasting syndrome, malabsorption, mast cell activation disorder, POTS, GERD, IBS, partial seizures, anxiety, asthma presenting with 1 month progressive weakness now accompanied by worsening SOB and inability to hold head up c/w possible myasthenia flare.   History of present illness   29 year old woman with extremely complex PMH including MG (never intubated) detailed below presenting with progressive weakness concern for MG flare.  Requiring C collar to hold head up, PCCM consulted out of concern for ability to protect airway.  Past Medical History  " Past Medical History:  Anemia  Ankle fracture, left  Anxiety  Asthma  Cardiac arrhythmia  Carpal tunnel syndrome  Cubital tunnel syndrome, bilateral  GERD (gastroesophageal reflux disease)  Hemorrhoids  Hiatal hernia  IBS (irritable bowel syndrome)  IUD (intrauterine device) in place 2014  Migraines  Partial seizure (HCC)  POTS (postural orthostatic tachycardia syndrome)  PTSD (post-traumatic stress disorder)  Shoulder fracture, right  Myasthenia Gravis Ehler Danlos Wasting syndrome  Past Surgical History:  Procedure Laterality Date  . ADENOIDECTOMY  . COLONOSCOPY  . GALLBLADDER SURGERY  . RECTAL POLYPECTOMY  . TONSILLECTOMY  . UPPER GASTROINTESTINAL ENDOSCOPY   Family History  Problem Relation Age of Onset  . Lupus Mother  . Seizures Mother  . Hypertension Maternal Grandfather  . Cancer Maternal Grandfather  . Irritable bowel syndrome Maternal Grandfather  . Heart disease Maternal Grandfather  pacemaker stents  . Seizures Maternal Grandfather   Social History   Tobacco Use  . Smoking status: Former Smoker  Last attempt to quit: 06/21/2011   Years since quitting: 5.9  . Smokeless tobacco: Never Used  Substance Use Topics  . Alcohol use: Yes  Comment: occassional  . Drug use: Yes  Types: Marijuana  Comment: occcasional use for pain or nausea "  Significant Hospital Events   05/16/20 Admitted  Consults:  Neurology  Procedures:  N/A  Significant Diagnostic Tests:  COVID neg CXR clear  Micro Data:  n/a  Antimicrobials:  n/a   Interim history/subjective:  admitting  Objective   Blood pressure 126/72, pulse 63, temperature 98.3 F (36.8 C), temperature source Oral, resp. rate (!) 22, SpO2 100 %.       No intake or output data in the 24 hours ending 05/16/20 1014 There were no vitals filed for this visit.  Examination: Constitutional: young chronically ill malnourished appearing woman in no acute distress; speaks in short sentences before needing to catch breath Eyes: left>right ptosis Ears, nose, mouth, and throat: mucous membranes moist, trachea midline Cardiovascular: heart sounds are regular, ext are warm to touch. no edema Respiratory: Diminished at bases, no wheezing Gastrointestinal: abdomen is soft with + BS Skin: No rashes, normal turgor Neurologic: moves all 4 ext to command, profoundly weak Psychiatric: AOx3  Resolved Hospital Problem list   N/A  Assessment & Plan:  Progressive weakness with hx of MG (need to get records) - IVIG vs. PLEX per neuro discretion, hold on steroids given proclivity to worsen short term - Continue mestinon - Monitor NIF/VC, clinical appearance to determine potential need for intubation, will need to intubate either with C collar or with traction given reported hx of cervical instability  Myriad of other health issues: wasting syndrome, Ehler Danlos, migraines,  partial seizures, chronic anemia, asthma, mast cell activation disorder - Get home med list and reconcile - Nebs for now  Best practice:  Diet: NPO Pain/Anxiety/Delirium protocol (if indicated):  N/A VAP protocol (if indicated): N/A DVT prophylaxis: heparin, SCDs GI prophylaxis: N/A Glucose control: q4h CBG Mobility: BR Code Status: full Family Communication: updated patient, [place] discussion due by 05/23/20 if remains in ICU Disposition: ICU   Medical Decision Making    Diagnoses that are immediately life threatening include MG flare Critical test findings: low NIF, shallow inspiratory effort, able to speak in short sentences only Interventions today to address these diagnoses are neurology consult, NIF/VC monitoring Likelihood of life-threatening deterioration without intervention is high.  Labs   CBC: Recent Labs  Lab 05/16/20 0733  WBC 5.2  NEUTROABS 3.0  HGB 9.5*  HCT 30.4*  MCV 88.9  PLT 902    Basic Metabolic Panel: Recent Labs  Lab 05/16/20 0733  NA 143  K 3.4*  CL 107  CO2 27  GLUCOSE 97  BUN 10  CREATININE 0.60  CALCIUM 8.5*   GFR: CrCl cannot be calculated (Unknown ideal weight.). Recent Labs  Lab 05/16/20 0733  WBC 5.2    Liver Function Tests: No results for input(s): AST, ALT, ALKPHOS, BILITOT, PROT, ALBUMIN in the last 168 hours. No results for input(s): LIPASE, AMYLASE in the last 168 hours. No results for input(s): AMMONIA in the last 168 hours.  ABG No results found for: PHART, PCO2ART, PO2ART, HCO3, TCO2, ACIDBASEDEF, O2SAT   Coagulation Profile: No results for input(s): INR, PROTIME in the last 168 hours.  Cardiac Enzymes: No results for input(s): CKTOTAL, CKMB, CKMBINDEX, TROPONINI in the last 168 hours.  HbA1C: No results found for: HGBA1C  CBG: No results for input(s): GLUCAP in the last 168 hours.  Review of Systems:    Positive Symptoms in bold:  Constitutional fevers, chills, weight loss, fatigue, anorexia, malaise  Eyes decreased vision, double vision, eye irritation  Ears, Nose, Mouth, Throat sore throat, trouble swallowing, sinus congestion  Cardiovascular chest pain, paroxysmal nocturnal dyspnea, lower  ext edema, palpitations   Respiratory SOB, cough, DOE, hemoptysis, wheezing  Gastrointestinal nausea, vomiting, diarrhea  Genitourinary burning with urination, trouble urinating  Musculoskeletal joint aches, joint swelling, back pain  Integumentary  rashes, skin lesions  Neurological focal weakness, focal numbness, trouble speaking, headaches  Psychiatric depression, anxiety, confusion  Endocrine polyuria, polydipsia, cold intolerance, heat intolerance  Hematologic abnormal bruising, abnormal bleeding, unexplained nose bleeds  Allergic/Immunologic recurrent infections, hives, swollen lymph nodes     Allergies Not on File   Home Medications  Prior to Admission medications   Not on File     Critical care time: 35 minutes

## 2020-05-16 NOTE — Progress Notes (Signed)
Medications that may worsen or trigger MG exacerbation: Class IA antiarrhythmics, magnesium, flouroquinolones, macrolides, aminoglycosides, penicillamine, curare, interferon alpha, botox, quinine. Use with caution: CCBs, BBs, statins.

## 2020-05-16 NOTE — TOC Progression Note (Signed)
Transition of Care [facility]) - Progression Note    Patient Details  Name: Brandy [name] MRN: [id number] Date of Birth: June 28, 1990  [facility] CM/SW Contact  Carley Hammed, [place] Phone Number: 05/16/2020, 2:43 PM  Clinical Narrative:     CSW spoke with nurse who had concerns for DC. She states that she is from [place] and had a care aid there, and needs one for here. She confirms that she has gotten a list of places, but has not been able to find one. her 29 year old son is currently staying with his god parents, and she states that she wants to go home now to care for him, but the nurse confirmed that pt has severe deficits that might make that unsafe. Pt also states that if she had an aide, she could care for them. She is worried that the Godparents will involve social services if she cannot take him back. She established that the child was in a safe place and was being cared for appropriately. CSW on that floor notified and nurse is attempting to get more information. SW will continue to follow.       Expected Discharge Plan and Services                                                 Social Determinants of Health (SDOH) Interventions    Readmission Risk Interventions No flowsheet data found.

## 2020-05-17 DIAGNOSIS — G7001 Myasthenia gravis with (acute) exacerbation: Secondary | ICD-10-CM | POA: Diagnosis not present

## 2020-05-17 LAB — BASIC METABOLIC PANEL
Anion gap: 10 (ref 5–15)
BUN: 8 mg/dL (ref 6–20)
CO2: 23 mmol/L (ref 22–32)
Calcium: 8.5 mg/dL — ABNORMAL LOW (ref 8.9–10.3)
Chloride: 105 mmol/L (ref 98–111)
Creatinine, Ser: 0.79 mg/dL (ref 0.44–1.00)
GFR, Estimated: >60 mL/min (ref >60)
Glucose, Bld: 70 mg/dL (ref 70–99)
Potassium: 3.2 mmol/L — ABNORMAL LOW (ref 3.5–5.1)
Sodium: 138 mmol/L (ref 135–145)

## 2020-05-17 LAB — PHOSPHORUS: Phosphorus: 4.1 mg/dL (ref 2.5–4.6)

## 2020-05-17 LAB — CBC
HCT: 35.5 % — ABNORMAL LOW (ref 36.0–46.0)
Hemoglobin: 11.3 g/dL — ABNORMAL LOW (ref 12.0–15.0)
MCH: 27.9 pg (ref 26.0–34.0)
MCHC: 31.8 g/dL (ref 30.0–36.0)
MCV: 87.7 fL (ref 80.0–100.0)
Platelets: 177 10*3/uL (ref 150–400)
RBC: 4.05 MIL/uL (ref 3.87–5.11)
RDW: 15.1 % (ref 11.5–15.5)
WBC: 9.5 10*3/uL (ref 4.0–10.5)
nRBC: 0 % (ref 0.0–0.2)

## 2020-05-17 LAB — GLUCOSE, CAPILLARY
Glucose-Capillary: 74 mg/dL (ref 70–99)
Glucose-Capillary: 69 mg/dL — ABNORMAL LOW (ref 70–99)
Glucose-Capillary: 149 mg/dL — ABNORMAL HIGH (ref 70–99)
Glucose-Capillary: 80 mg/dL (ref 70–99)
Glucose-Capillary: 80 mg/dL (ref 70–99)
Glucose-Capillary: 86 mg/dL (ref 70–99)

## 2020-05-17 LAB — MAGNESIUM: Magnesium: 1.9 mg/dL (ref 1.7–2.4)

## 2020-05-17 MED ORDER — LEVOCETIRIZINE DIHYDROCHLORIDE 5 MG PO TABS
5.0000 mg | ORAL_TABLET | Freq: Two times a day (BID) | ORAL | Status: DC
  Administered 2020-05-18 – 2020-05-21 (×8): 5 mg via ORAL
  Filled 2020-05-17 (×8): qty 1

## 2020-05-17 MED ORDER — ONDANSETRON 4 MG PO TBDP
8.0000 mg | ORAL_TABLET | Freq: Three times a day (TID) | ORAL | Status: DC | PRN
  Administered 2020-05-17 – 2020-05-18 (×3): 8 mg via ORAL
  Filled 2020-05-17 (×3): qty 2

## 2020-05-17 MED ORDER — POTASSIUM CHLORIDE 20 MEQ PO PACK
40.0000 meq | PACK | Freq: Three times a day (TID) | ORAL | Status: DC
  Filled 2020-05-17: qty 2

## 2020-05-17 MED ORDER — GLUCERNA 1.2 CAL PO LIQD
1000.0000 mL | ORAL | Status: DC
  Filled 2020-05-17 (×4): qty 1000

## 2020-05-17 MED ORDER — IPRATROPIUM-ALBUTEROL 0.5-2.5 (3) MG/3ML IN SOLN
3.0000 mL | Freq: Two times a day (BID) | RESPIRATORY_TRACT | Status: DC
  Administered 2020-05-17 – 2020-05-21 (×8): 3 mL via RESPIRATORY_TRACT
  Filled 2020-05-17 (×9): qty 3

## 2020-05-17 MED ORDER — LEVOCETIRIZINE DIHYDROCHLORIDE 5 MG PO TABS
5.0000 mg | ORAL_TABLET | Freq: Once | ORAL | Status: AC
  Administered 2020-05-17: 5 mg via ORAL

## 2020-05-17 MED ORDER — DEXTROSE 50 % IV SOLN
12.5000 g | INTRAVENOUS | Status: AC
  Administered 2020-05-17: 12.5 g via INTRAVENOUS
  Filled 2020-05-17: qty 50

## 2020-05-17 MED ORDER — FAMOTIDINE 20 MG PO TABS
20.0000 mg | ORAL_TABLET | Freq: Two times a day (BID) | ORAL | Status: DC
  Administered 2020-05-17 – 2020-05-19 (×4): 20 mg via ORAL
  Filled 2020-05-17 (×3): qty 1

## 2020-05-17 MED ORDER — LEVOCETIRIZINE DIHYDROCHLORIDE 5 MG PO TABS
5.0000 mg | ORAL_TABLET | Freq: Once | ORAL | Status: AC
  Administered 2020-05-17: 5 mg via ORAL
  Filled 2020-05-17: qty 1

## 2020-05-17 MED ORDER — POTASSIUM CHLORIDE 10 MEQ/50ML IV SOLN
10.0000 meq | INTRAVENOUS | Status: AC
  Administered 2020-05-17 (×4): 10 meq via INTRAVENOUS
  Filled 2020-05-17 (×4): qty 50

## 2020-05-17 NOTE — Progress Notes (Signed)
NAME:  Brandy [name], MRN:  [id number], DOB:  [birth year], LOS: 1 ADMISSION DATE:  05/16/2020, CONSULTATION DATE:  05/16/20 REFERRING MD:  Rosalia Hammers, CHIEF COMPLAINT:  weakness   Brief History   29 year old woman with hx of MG, Ehler Danlos w/ cervical instability, dysautonomia, wasting syndrome, malabsorption, mast cell activation disorder, POTS, GERD, IBS, partial seizures, anxiety, asthma presenting with 1 month progressive weakness now accompanied by worsening SOB and inability to hold head up c/w possible myasthenia flare.  Past Medical History  " Past Medical History:  Anemia  Ankle fracture, left  Anxiety  Asthma  Cardiac arrhythmia  Carpal tunnel syndrome  Cubital tunnel syndrome, bilateral  GERD (gastroesophageal reflux disease)  Hemorrhoids  Hiatal hernia  IBS (irritable bowel syndrome)  IUD (intrauterine device) in place 2014  Migraines  Partial seizure (HCC)  POTS (postural orthostatic tachycardia syndrome)  PTSD (post-traumatic stress disorder)  Shoulder fracture, right  Myasthenia Gravis Ehler Danlos Wasting syndrome  Past Surgical History:  Procedure Laterality Date  . ADENOIDECTOMY  . COLONOSCOPY  . GALLBLADDER SURGERY  . RECTAL POLYPECTOMY  . TONSILLECTOMY  . UPPER GASTROINTESTINAL ENDOSCOPY   Family History  Problem Relation Age of Onset  . Lupus Mother  . Seizures Mother  . Hypertension Maternal Grandfather  . Cancer Maternal Grandfather  . Irritable bowel syndrome Maternal Grandfather  . Heart disease Maternal Grandfather  pacemaker stents  . Seizures Maternal Grandfather   Social History   Tobacco Use  . Smoking status: Former Smoker  Last attempt to quit: 06/21/2011  Years since quitting: 5.9  . Smokeless tobacco: Never Used  Substance Use Topics  . Alcohol use: Yes  Comment: occassional  . Drug use: Yes  Types: Marijuana  Comment: occcasional use for pain or nausea "  Significant Hospital Events   05/16/20 Admitted  Consults:    Neurology  Procedures:  N/A  Significant Diagnostic Tests:  COVID neg CXR clear  Micro Data:  n/a  Antimicrobials:  n/a   Interim history/subjective:  HD catheter was placed yesterday, she patient received 1 session of plasmapheresis.  Patient has been refusing most of the care, her blood sugar was in 70s she refused to drink apple/orange juice, was given D50.  She has PEG tube but she refused to be started on tube feeding  Objective   Blood pressure 140/85, pulse 70, temperature 98.3 F (36.8 C), temperature source Oral, resp. rate 16, height 5\' 4"  (1.626 m), weight 49.9 kg, SpO2 99 %.        Intake/Output Summary (Last 24 hours) at 05/17/2020 1111 Last data filed at 05/17/2020 0800 Gross per 24 hour  Intake 1600 ml  Output 1500 ml  Net 100 ml   Filed Weights   05/16/20 1026 05/16/20 1640  Weight: 49.9 kg 49.9 kg    Examination: Constitutional: young chronically ill malnourished appearing woman lying in the bed Eyes: No ptosis noted Ears, nose, mouth, and throat: mucous membranes moist, trachea midline Cardiovascular: heart sounds are regular, ext are warm to touch. no edema Respiratory: Diminished at bases, no wheezing Gastrointestinal: abdomen is soft with + BS Skin: No rashes, normal turgor Neurologic: moves all 4 ext to command, generalized weak, alert, awake and following commands  skin: No rashes  Resolved Hospital Problem list   N/A  Assessment & Plan:  Acute exacerbation of myasthenia gravis Patient was started on plasmapheresis per neurology recommendations yesterday Holding his steroid as it may cause worsening of her symptoms Continue mestinon Her NIF/VC remain above  goal She is protecting her airways  Cachexia/severe protein calorie malnutrition Frequent hypoglycemia Patient has been refusing tube feeds, even though she has PEG tube in place He received D50 x2, encouraged to be started on tube feeds but at this time she is refusing  suggesting she may develop allergic reaction to it  Hypokalemia Electrolytes are being supplemented  Best practice:  Diet: NPO.  Refusing tube feeds Pain/Anxiety/Delirium protocol (if indicated): N/A VAP protocol (if indicated): N/A DVT prophylaxis: heparin, SCDs GI prophylaxis: N/A Glucose control: q4h CBG Mobility: BR Code Status: full Family Communication: updated patient Disposition: Transfer to progressive care unit   Labs   CBC: Recent Labs  Lab 05/16/20 0733 05/17/20 0551  WBC 5.2 9.5  NEUTROABS 3.0  --   HGB 9.5* 11.3*  HCT 30.4* 35.5*  MCV 88.9 87.7  PLT 168 177    Basic Metabolic Panel: Recent Labs  Lab 05/16/20 0733 05/16/20 1502 05/17/20 0551  NA 143  --  138  K 3.4*  --  3.2*  CL 107  --  105  CO2 27  --  23  GLUCOSE 97  --  70  BUN 10  --  8  CREATININE 0.60 0.63 0.79  CALCIUM 8.5*  --  8.5*  MG  --  2.1 1.9  PHOS  --  4.3 4.1   GFR: Estimated Creatinine Clearance: 82.5 mL/min (by C-G formula based on SCr of 0.79 mg/dL). Recent Labs  Lab 05/16/20 0733 05/17/20 0551  WBC 5.2 9.5    Liver Function Tests: Recent Labs  Lab 05/16/20 1502  AST 18  ALT 18  ALKPHOS 57  BILITOT 1.8*  PROT 5.9*  ALBUMIN 3.9   No results for input(s): LIPASE, AMYLASE in the last 168 hours. No results for input(s): AMMONIA in the last 168 hours.  ABG No results found for: PHART, PCO2ART, PO2ART, HCO3, TCO2, ACIDBASEDEF, O2SAT   Coagulation Profile: No results for input(s): INR, PROTIME in the last 168 hours.  Cardiac Enzymes: No results for input(s): CKTOTAL, CKMB, CKMBINDEX, TROPONINI in the last 168 hours.  HbA1C: Hgb A1c MFr Bld  Date/Time Value Ref Range Status  05/16/2020 03:02 PM 4.7 (L) 4.8 - 5.6 % Final    Comment:    (NOTE) Pre diabetes:          5.7%-6.4%  Diabetes:              >6.4%  Glycemic control for   <7.0% adults with diabetes     CBG: Recent Labs  Lab 05/16/20 2311 05/17/20 0310 05/17/20 0742 05/17/20 0855  05/17/20 1104  GLUCAP 72 74 69* 149* 80    Review of Systems:    Positive Symptoms in bold:  Constitutional fevers, chills, weight loss, fatigue, anorexia, malaise  Eyes decreased vision, double vision, eye irritation  Ears, Nose, Mouth, Throat sore throat, trouble swallowing, sinus congestion  Cardiovascular chest pain, paroxysmal nocturnal dyspnea, lower ext edema, palpitations   Respiratory SOB, cough, DOE, hemoptysis, wheezing  Gastrointestinal nausea, vomiting, diarrhea  Genitourinary burning with urination, trouble urinating  Musculoskeletal joint aches, joint swelling, back pain  Integumentary  rashes, skin lesions  Neurological focal weakness, focal numbness, trouble speaking, headaches  Psychiatric depression, anxiety, confusion  Endocrine polyuria, polydipsia, cold intolerance, heat intolerance  Hematologic abnormal bruising, abnormal bleeding, unexplained nose bleeds  Allergic/Immunologic recurrent infections, hives, swollen lymph nodes     Allergies Allergies  Allergen Reactions  . Aspirin   . Nsaids   . Quinolones   .  Sulfa Antibiotics   . Telithromycin   . Fludrocortisone   . Ambien [Zolpidem]   . Botulinum Toxins   . Chlorhexidine Hives  . Codeine   . Creon [Pancrelipase (Lip-Prot-Amyl)]   . Cymbalta [Duloxetine Hcl]   . Dextrans   . Ergotamine   . Gabapentin   . Gallamine   . Hydrocodone   . Lunesta [Eszopiclone]   . Macrolides And Ketolides   . Maprotiline   . Marplan [Isocarboxazid]     Any MAOI contraindicated  . Midodrine   . Morphine And Related   . Nitrous Oxide   . Oxycodone   . Pethidine [Meperidine]   . Phenergan [Promethazine]   . Phenobarbital   . Pyridium [Phenazopyridine]   . Reglan [Metoclopramide]   . Remeron [Mirtazapine]     Noradrenergic antagonist  . Savella [Milnacipran]   . Scopolamine   . Sumatriptan   . Tape   . Topiramate   . Tricyclic Antidepressants   . Vancomycin      Home Medications  Prior to  Admission medications   Not on File      Cheri Fowler MD Critical care physician [facility] Critical Care  Pager: [phone] Mobile: [phone]

## 2020-05-17 NOTE — Progress Notes (Signed)
Patient transferred to 3W at this time without incident. Report given to [facility]. A cart full of belongings were transferred with patient including more home medications that are to be sent to pharmacy tonight. RN aware. VSS, 2L O2 via [place] in place.

## 2020-05-17 NOTE — Progress Notes (Signed)
Pt transported from 4N ICU. Pt is alert and oriented. Pt has a significant amount of personal medication from home at her bedside. Pt has complaints of not having her voice recorder at the bedside and it was explained to the pt that it is against the policy to record or video any staff. Pt wants to keep personal home tube feeding in freezer. Pt tearful and nurse and nurse tech comforting pt. Pt has her call button within reach and has been educated on its use. Pt bed at lowest position and bed alarm is set. Staff will continue to monitor.

## 2020-05-17 NOTE — Progress Notes (Signed)
RT in to pt room to give scheduled breathing treatment and to perform NIF/VC. Upon arrival pt accusing this RT of having perfume on and telling RT to leave. This RT does not have perfume on. Pt continues to tell this RT that she wants to be transferred and that no one here cares about her. Pt encouraged that staff only wants what is best for her and we are doing the proper therapies at this time. Pt then refused to let this RT give the scheduled nebulizer treatments and NIF/VC. Dr. Merrily Pew notified.

## 2020-05-17 NOTE — Progress Notes (Signed)
Patient performed NIF -20 3rd try and VC 1.5L. Good effort. Pt is relaxed at this moment and resting well. RN aware of the exercise.

## 2020-05-17 NOTE — Progress Notes (Signed)
IV team notes:  Arrived at bedside with another IV nurse [facility], patient crying when we entered room. Unit secretary informed us that they are awaiting for [facility]. RN Ishmael Holter RN also confirmed . RN will consult IVT if pt goes ama for removal of port HPN.

## 2020-05-17 NOTE — Progress Notes (Signed)
Previously pt was upset because we do not have a freezer to put her frozen food from home in. I explained that it was an infection risk to place these items in the unit freezer. She then stated that she needed her portable freezer. I explained if someone could bring the freezer facilities would have to check it to make sure it was not a fire hazard. She then stated she had no one to bring the freezer. She proceeded to say we needed to pay somebody to take care of this for her. I explained we would not and left the room. Pt later attempting to get out of bed to retrieve inhaler. Pt is aware that she is wheel chair bound and unable to walk. So pt fell to her knees attempting to do this. Several staff members entered room to assist her back to bed. Posey was then placed on pt. She began screaming and hitting staff and stating she wanted to leave ama. This rn entered room. RN was talking loudly to pt to be heard over her screaming. Once screaming stopped I informed her that she could sign the ama paper and we would call her ambulance to get home. Or she could start respecting staff, stop hitting them and accept the care we were offering. She then said she didn't want to leave ama anymore but that we hurt her arm getting her back to bed. I again sternly told pt she was going to stop assaulting our staff and accept our care. She stated she wanted to go to another room. I explained to her that there was no rooms available for her to go to. She stated she wanted to talk to a pt advocate and told me to stop insulting her. AC Lenward Chancellor was called to come talk with pt. She came to speak with pt. Isabelle Course was able to calm pt down and is now attempting to find her a bed on 3W. Dr. Merrily Pew made aware of pt behavior. He said she could leave ama or he could be there when he was done assisting other pt in another ICU. Dr. Merrily Pew then arrived shortly after being paged.

## 2020-05-17 NOTE — Progress Notes (Addendum)
Patient very emotional and anxious regarding nutritional status. "I am always very anxious when it comes to food". Patient states she's allergic to all tube feeds and must mix her own foods for her tube feedings. Patient also states she can't eat any of our hospital supplements like apple sauce, juices, or pudding because it causes a whole system upset/exacerbation. She needs her preferred pears peeled, crushed, heated and cut in a certain way in order to eat "or else there's histamine in them". Patient consistently making particular requests about her care, and dictating what should be done. This nurse as well as many staff members explained multiple times throughout the day of what we can offer her here at the hospital, and that we would do our best to meet her needs. Patient very upset that this hospital cannot accomodate her frozen food. Currently awaiting SLP consult. Patient already seen by SW this AM as per request, and pharmacy tech multiple times with all her needs and questions answered. Much emotional support given to patient throughout the day.   4:09 PM  Patient observed attempting to slide out of bed with knees on the floor. Patient states she was trying to get her inhaler that was located in her bag across the room. Patient baseline wheel chair bound.  Patient assisted back to bed with multiple staff members present. Patient given inhaler. Patient hysterical and crying,yellling that she wanted to be transferred or leave AMA while staff attempted to put a posey on for her safety. Patient provided with more emotional support and educated about call bell and safety in the hospital. Charge notified [facility] to speak with patient. CCM also aware and at bedside to speak with patient.

## 2020-05-17 NOTE — Progress Notes (Signed)
Pt was able to produce -35 on NIF with some coaching.  She did mention that the effort caused some transient discomfort in her chest.  Budesonide initiated but pt stated she was unfamiliar with the medication and so treatment was terminated.  Will evaluate need for redundant inhaled cortico steroid, as pt is taking fluticason with her Wixela DPI.

## 2020-05-17 NOTE — Progress Notes (Signed)
Dr. Merrily Pew in to pt bedside with this RT and RN. Pt again encouraged that staff are only doing what is best for her and providing her with the proper therapies at this time. Pt agreeable to do NIF at this time. -18,-20 and -25 were achieved with good effort. Upon exiting pt room pt states she just wants her medicines. This RT explained to pt that therapies/medicines have been offered and she continues to refuse. Breathing treatments offered again and pt agreeable to taking them. Pt placed mask on self did not want help from this RT. Pt instructed that this RT would listen to breath sounds in which she said okay. During this RT listening to breath sounds pt began to get extremely agitated that the door to the hallway was open. Subsequently pushed this RT hands/arms forcefully and pulled this RT stethoscope. This RT informed pt that she can not hit staffing and that it is not tolerated. Door to pt room shut and RT stood by doorway away from pt as to not increase agitation. Pt then stated she was sorry and this RT could listen to breath sounds. RT did so and again stood away from pt until aerosol was complete. RT will continue to monitor and be available as needed.

## 2020-05-17 NOTE — Progress Notes (Addendum)
Hypoglycemic Event  CBG: 69  Treatment: Patient refusing apple juice. "I'm nervous about the ingredients in the apple juice, it might upset my stomach". Refusing all other juices. Dextrose amp given.   Symptoms: Slightly diaphoretic and shaky per patient.   Follow-up CBG: Time: CBG Result:149  Possible Reasons for Event: NPO and refusing tube feeds unless she makes them herself.  Comments/MD notified: Dr. Corrinne Eagle [employer] Areyana Leoni

## 2020-05-17 NOTE — Evaluation (Signed)
Clinical/Bedside Swallow Evaluation Patient Details  Name: Brandy [name] MRN: [id number] Date of Birth: [birth year]  Today's Date: 05/17/2020 Time: SLP Start Time (ACUTE ONLY): 1545 SLP Stop Time (ACUTE ONLY): 1649 SLP Time Calculation (min) (ACUTE ONLY): 64 min  Past Medical History:  Past Medical History:  Diagnosis Date  . Myasthenia gravis [facility])    Past Surgical History: History reviewed. No pertinent surgical history. HPI:  Pt is a 29 year old woman with hx of MG, Ehler Danlos w/ cervical instability, dysautonomia, wasting syndrome, malabsorption, mast cell activation disorder, POTS, GERD, IBS, partial seizures, anxiety, asthma who presented with 1 month progressive weakness now accompanied by worsening SOB and inability to hold head up concerning for possible myasthenia flare. Pt has PEG tube but has refused G-tube feeds and p.o. liquids. Pt was seen by neurology; PLEX recommended every other day x 5 doses; IVIG not preferred due to Mast Cell Activation Syndrome. Neurology noted improved speech and lid ptosis after 1 round of PLEX. Pt was made NPO by neurology until swallow evaluation completed.    Assessment / Plan / Recommendation Clinical Impression  Pt was seen for bedside swallow evaluation. She was alert throughout the session and described a long history of dysphagia dating back to her childhood with frequent episodes of choking. Per the pt, her symptoms have worsened with MG diagnosis. Pt stated that she has had "extensive" speech therapy for augmentative communication and has had many swallow studies in the past. Per the pt, her most recent instrumental swallowing evaluation was in [place], [place] and it was recommended that she push on her larynx and massage her throat to stop her epiglottis from getting stuck and to stop nasal regurgitation. Oral mechanism exam was significant for reduced velar elevation and lingual weakness. Vocal quality was weak and hoarse with reported need for  intermittent rest periods.   Pt indicated that she could not try multiple foods/liquids due to them causing a reaction with her mast cell activation disorder. Trials were therefore limited to foods which pt brought from home which the SLP prepared per the pt's specifications. Pt indicated that she needed to take her Cromolyn prior to consuming any solids since she would otherwise have stomach cramps. Pt's RN contacted pharmacy and the Cromolyn was provided prior to solid trials. Pt's solid trials were restricted to home made cassava crackers and pears prepared to her specifications to include washing them as directed by pt, peeling them, warming them for at least two minutes, adding room-temperature water to them in her cups brought from home, and crushing and cutting them in the provided cups. Pt exhibited an inconsistent cough with all solids and room-temperature water from her bottle. Effortful, audible swallows, and eructation were noted. Mastication was prolonged but functional and no significant oral residue was noted. Pt also intermittently demonstrated a posterior head tilt with liquids which she indicated helped with posterior propulsion. Pt intermittently applied pressure the larynx when swallowing solids and indicated that this was helpful in improving her swallow function. Pt  indicated that her swallow is only marginally worse than her baseline and she attributed coughing to foods getting "stuck" rather than to aspiration.   Pt expressed concern regarding substances used in an instrumental assessment causing mast cell activation and requested that it be deferred at this time. Diet options were discussed at length with pt and she expressed that hospitals cannot typically accommodate her dietary needs. Per the pt, when she runs out of her "safe foods" from home she orders  foods from [employer] which can be prepared in the microwaved by staff. It was agreed that she may have a dysphagia 3 diet. However,  she requested that it not be ordered until she is able to discuss her dietary needs with the dietitian and/or kitchen staff to determine whether our hospital's food would be safe for her to consume. SLP will follow pt.  SLP Visit Diagnosis: Dysphagia, unspecified (R13.10)    Aspiration Risk  Mild aspiration risk    Diet Recommendation Dysphagia 3 (Mech soft);Thin liquid   Liquid Administration via: Cup Medication Administration: Whole meds with liquid Supervision: Patient able to self feed Compensations: Slow rate;Small sips/bites;Follow solids with liquid Postural Changes: Seated upright at 90 degrees;Remain upright for at least 30 minutes after po intake    Other  Recommendations Oral Care Recommendations: Oral care BID   Follow up Recommendations  (TBD)      Frequency and Duration min 2x/week  2 weeks       Prognosis Prognosis for Safe Diet Advancement: Fair Barriers to Reach Goals: Severity of deficits;Time post onset      Swallow Study   General Date of Onset: 05/16/20 HPI: Pt is a 29 year old woman with hx of MG, Ehler Danlos w/ cervical instability, dysautonomia, wasting syndrome, malabsorption, mast cell activation disorder, POTS, GERD, IBS, partial seizures, anxiety, asthma who presented with 1 month progressive weakness now accompanied by worsening SOB and inability to hold head up concerning for possible myasthenia flare. Pt has PEG tube but has refused G-tube feeds and p.o. liquids. Pt was seen by neurology; PLEX recommended every other day x 5 doses; IVIG not preferred due to Mast Cell Activation Syndrome. Neurology noted improved speech and lid ptosis after 1 round of PLEX. Pt was made NPO by neurology until swallow evaluation completed.  Type of Study: Bedside Swallow Evaluation Previous Swallow Assessment: None at this hospital but last in [place], [place] with rx to manually push on larynx to stop epiglottis from sticking.  Diet Prior to this Study: NPO Temperature Spikes  Noted: No Respiratory Status: Nasal cannula History of Recent Intubation: No Behavior/Cognition: Alert;Cooperative (anxious and needing time to "calm down") Oral Cavity Assessment: Within Functional Limits Oral Care Completed by SLP: No Oral Cavity - Dentition: Adequate natural dentition Vision: Functional for self-feeding Self-Feeding Abilities: Able to feed self Patient Positioning: Upright in bed;Postural control adequate for testing Baseline Vocal Quality: Breathy;Hoarse Volitional Cough: Weak;Strong Volitional Swallow: Able to elicit    Oral/Motor/Sensory Function Overall Oral Motor/Sensory Function: Mild impairment Facial Symmetry: Within Functional Limits Facial Strength: Within Functional Limits Lingual Symmetry: Within Functional Limits Lingual Strength: Reduced Velum: Impaired right;Impaired left (elevation reduced) Mandible: Within Functional Limits   Ice Chips Ice chips: Impaired Presentation: Spoon Oral Phase Impairments: Impaired mastication   Thin Liquid Thin Liquid: Impaired Presentation:  (bottle) Pharyngeal  Phase Impairments: Cough - Immediate    Nectar Thick Nectar Thick Liquid: Not tested   Honey Thick Honey Thick Liquid: Not tested   Puree Puree: Not tested   Solid     Solid: Impaired Presentation: Self Fed;Spoon Pharyngeal Phase Impairments: Multiple swallows (effortful swallow ) , cough, eructation following effortful swallow    Manuel Lawhead I. Vear Clock, MS, CCC-SLP Acute Rehabilitation Services Office number [phone] Pager [phone]  Scheryl Marten 05/17/2020,5:33 PM

## 2020-05-17 NOTE — Plan of Care (Signed)
  Problem: Education: Goal: Knowledge of General Education information will improve Description: Including pain rating scale, medication(s)/side effects and non-pharmacologic comfort measures 05/17/2020 1217 by Alycia Rossetti, RN Outcome: Not Progressing 05/17/2020 1217 by Alycia Rossetti, RN Outcome: Not Progressing

## 2020-05-17 NOTE — Progress Notes (Signed)
Patient requesting to speak to a pharmacist regarding her allergy medication. Victorino Dike in pharmacy aware and will be coming to bedside to speak with patient.

## 2020-05-17 NOTE — Progress Notes (Signed)
Patient refusing tube feed. States she had an "anaphylactic reaction in the past" and does not tolerate most tube feeds.

## 2020-05-17 NOTE — Progress Notes (Signed)
NEUROLOGY CONSULTATION PROGRESS NOTE   Date of service: May 17, 2020 Patient Name: Brandy [name] MRN:  [id number] DOB:  [birth year]  Brief HPI   Brandy [name] is a 29 y.o. female with PMH significant for mast cell activation syndrome, Ehler danlos with cervical instability, POTS, seronegative myasthenia with inconclusive LRP4 Ab who presents with drooping eyelids, SOB, facial diplegia concerning for Myasthenia exacerbation.   Interval Hx   Improved speech and lid ptosis after just 1 round of PLEX. Agree with transfer out of ICU. She is worried about reaction to anything new that we start her with her prior diagnosis of mast cell and is worried about not getting her medications on very specific times that she takes them at home. I did discuss with patient that I am not very comfortable with her self administering her medications in the hospital.  She is also worried about child care and I will have social worker help out with that.  She slept better last night and that helped a lot with her symptoms too.  Vitals   Vitals:   05/17/20 0400 05/17/20 0500 05/17/20 0600 05/17/20 0700  BP:  119/78 128/89 131/83  Pulse:      Resp: (!) 22 18 17 20   Temp: 98.4 F (36.9 C)     TempSrc: Oral     SpO2:      Weight:      Height:         Body mass index is 18.88 kg/m.  Physical Exam   General: Laying comfortably in bed; in no acute distress. HENT: Normal oropharynx and mucosa. Normal external appearance of ears and nose. Neck: Supple, no pain or tenderness CV: No JVD. No peripheral edema. Pulmonary: Symmetric Chest rise. Normal respiratory effort. Abdomen: Soft to touch, non-tender. Ext: No cyanosis, edema, or deformity  Skin: No rash. Normal palpation of skin.   Musculoskeletal: Normal digits and nails by inspection. No clubbing.   Neurologic Examination  Mental status/Cognition: Alert, oriented to self, place, month and year, good attention. Speech/language: Can speak in full  sentences today. Fluent, comprehension intact, object naming intact, repetition intact. Cranial nerves:   CN II Pupils equal and reactive to light, no VF deficits   CN III,IV,VI EOM intact, no gaze preference or deviation, no nystagmus   CN V normal sensation in V1, V2, and V3 segments bilaterally   CN VII no asymmetry, no nasolabial fold flattening   CN VIII normal hearing to speech   CN IX & X normal palatal elevation, no uvular deviation   CN XI 5/5 head turn and 5/5 shoulder shrug bilaterally   CN XII midline tongue protrusion   Motor:  Muscle bulk: poor, tone normal, pronator drift none tremor none Mvmt Root Nerve  Muscle Right Left Comments  SA C5/6 Ax Deltoid 5 4+   EF C5/6 Mc Biceps 5 4+   EE C6/7/8 Rad Triceps 5 4+   WF C6/7 Med FCR 5 5   WE C7/8 PIN ECU 5 5   F Ab C8/T1 U ADM/FDI 5 5   HF L1/2/3 Fem Illopsoas 4+ 4+   KE L2/3/4 Fem Quad     DF L4/5 D Peron Tib Ant 5 5   PF S1/2 Tibial Grc/Sol 5 5    Unable to test neck flexion and extension due to a chronic C collar in place due to hx of Cervical instability due to 10-09-1968.  Sensation:  Light touch Intact throughout   Pin prick  Temperature    Vibration   Proprioception     Labs   Basic Metabolic Panel:  Lab Results  Component Value Date   NA 138 05/17/2020   K 3.2 (L) 05/17/2020   CO2 23 05/17/2020   GLUCOSE 70 05/17/2020   BUN 8 05/17/2020   CREATININE 0.79 05/17/2020   CALCIUM 8.5 (L) 05/17/2020   GFRNONAA >60 05/17/2020   HbA1c:  Lab Results  Component Value Date   HGBA1C 4.7 (L) 05/16/2020   LDL: No results found for: [facility] Urine Drug Screen: No results found for: LABOPIA, COCAINSCRNUR, LABBENZ, AMPHETMU, THCU, LABBARB  Alcohol Level No results found for: ETH No results found for: PHENYTOIN, ZONISAMIDE, LAMOTRIGINE, LEVETIRACETA No results found for: PHENYTOIN, PHENOBARB, VALPROATE, CBMZ  Imaging and Diagnostic studies      Impression   Brandy [name] is a 29 y.o. female with PMH  significant for mast cell activation syndrome, Ehler danlos with cervical instability, POTS, seronegative myasthenia with inconclusive LRP4 Ab who presents with myasthenia exacerbation. Neuro exam today with improved facial diplegia and now speaking in full sentences. Will continue with PLEX.  Recommendations  - PLEX every other day x 5 doses ordered. (With her hx of Mast Cell Activation Syndrome, IVIG may not be a good idea) - Agree with spacing out NIFs and VC to Q8H and transfer out of ICU given improvement. - NPO until swallow eval - Recommend elective intubation for respiratory compromise ifVC falls below 15 to 20 mL/kgand or NIFs falls below -20cm/H2O.If poor effort and not sure, can always get ABG to assess for CO2 retention. Elective intubation for airway cmopromise if she has difficulty clearing her secretions. Oxygen saturation should not be used to make decision regarding intubation. -Recommendcontinuing MestinonPO 90mg  every 3 hours. If unable to swallow, can switch fromPO to IV.(30mg  PO is equivalent to 1mg  IV). -Medications that may worsen or trigger MG exacerbation: Class IA antiarrhythmics, magnesium, flouroquinolones, macrolides, aminoglycosides, penicillamine, curare, interferon alpha, botox, quinine. Use with caution:calcium channel blocker,beta blockers andstatins. ______________________________________________________________________   Thank you for the opportunity to take part in the care of this patient. If you have any further questions, please contact the neurology consultation attending.  Signed,  [facility] Pager Number 

## 2020-05-17 NOTE — Progress Notes (Signed)
Verbal order received from Dr. Merrily Pew to only obtain NIF Q8. RT will continue to monitor.

## 2020-05-17 NOTE — Progress Notes (Signed)
Received message that pt is open to [facility]. Medicaid won't transfer until the first. Pt is not ready to be D/C. Will continue to f/u to assist with D/C needs and will contact [facility] provider if [facility] ordered.

## 2020-05-17 NOTE — Progress Notes (Signed)
Pt is sleeping and do not want to be bothered. RT will continue to monitor.

## 2020-05-18 DIAGNOSIS — D8940 Mast cell activation, unspecified: Secondary | ICD-10-CM | POA: Diagnosis not present

## 2020-05-18 DIAGNOSIS — G7001 Myasthenia gravis with (acute) exacerbation: Secondary | ICD-10-CM | POA: Diagnosis not present

## 2020-05-18 DIAGNOSIS — E43 Unspecified severe protein-calorie malnutrition: Secondary | ICD-10-CM

## 2020-05-18 LAB — CBC
HCT: 35 % — ABNORMAL LOW (ref 36.0–46.0)
Hemoglobin: 11.2 g/dL — ABNORMAL LOW (ref 12.0–15.0)
MCH: 27.5 pg (ref 26.0–34.0)
MCHC: 32 g/dL (ref 30.0–36.0)
MCV: 86 fL (ref 80.0–100.0)
Platelets: 175 10*3/uL (ref 150–400)
RBC: 4.07 MIL/uL (ref 3.87–5.11)
RDW: 15.7 % — ABNORMAL HIGH (ref 11.5–15.5)
WBC: 7.5 10*3/uL (ref 4.0–10.5)
nRBC: 0 % (ref 0.0–0.2)

## 2020-05-18 LAB — BASIC METABOLIC PANEL
Anion gap: 9 (ref 5–15)
BUN: 5 mg/dL — ABNORMAL LOW (ref 6–20)
CO2: 26 mmol/L (ref 22–32)
Calcium: 8.6 mg/dL — ABNORMAL LOW (ref 8.9–10.3)
Chloride: 104 mmol/L (ref 98–111)
Creatinine, Ser: 0.75 mg/dL (ref 0.44–1.00)
GFR, Estimated: >60 mL/min (ref >60)
Glucose, Bld: 102 mg/dL — ABNORMAL HIGH (ref 70–99)
Potassium: 3 mmol/L — ABNORMAL LOW (ref 3.5–5.1)
Sodium: 139 mmol/L (ref 135–145)

## 2020-05-18 LAB — GLUCOSE, CAPILLARY
Glucose-Capillary: 89 mg/dL (ref 70–99)
Glucose-Capillary: 121 mg/dL — ABNORMAL HIGH (ref 70–99)
Glucose-Capillary: 126 mg/dL — ABNORMAL HIGH (ref 70–99)
Glucose-Capillary: 154 mg/dL — ABNORMAL HIGH (ref 70–99)
Glucose-Capillary: 127 mg/dL — ABNORMAL HIGH (ref 70–99)
Glucose-Capillary: 103 mg/dL — ABNORMAL HIGH (ref 70–99)

## 2020-05-18 LAB — MAGNESIUM: Magnesium: 2 mg/dL (ref 1.7–2.4)

## 2020-05-18 LAB — PHOSPHORUS: Phosphorus: 4.6 mg/dL (ref 2.5–4.6)

## 2020-05-18 MED ORDER — ACD FORMULA A 0.73-2.45-2.2 GM/100ML VI SOLN
Status: AC
  Administered 2020-05-19: 1000 mL
  Filled 2020-05-18: qty 500

## 2020-05-18 MED ORDER — DM-GUAIFENESIN ER 30-600 MG PO TB12: 1.0000 | ORAL_TABLET | Freq: Two times a day (BID) | ORAL | Status: DC | PRN

## 2020-05-18 MED ORDER — SODIUM CHLORIDE 0.9 % IV SOLN: INTRAVENOUS | Status: DC

## 2020-05-18 MED ORDER — DEXTROSE-NACL 5-0.45 % IV SOLN: INTRAVENOUS | Status: DC

## 2020-05-18 MED ORDER — ALPRAZOLAM 0.5 MG PO TABS
0.5000 mg | ORAL_TABLET | Freq: Once | ORAL | Status: AC
  Administered 2020-05-18: 0.5 mg via ORAL
  Filled 2020-05-18: qty 1

## 2020-05-18 MED ORDER — ONDANSETRON 4 MG PO TBDP: 8.0000 mg | ORAL_TABLET | Freq: Three times a day (TID) | ORAL | Status: DC | PRN

## 2020-05-18 MED ORDER — SODIUM CHLORIDE 0.9 % IV SOLN
INTRAVENOUS | Status: AC
  Filled 2020-05-18 (×4): qty 200

## 2020-05-18 MED ORDER — POTASSIUM CHLORIDE 10 MEQ/100ML IV SOLN
10.0000 meq | INTRAVENOUS | Status: AC
  Administered 2020-05-18 (×6): 10 meq via INTRAVENOUS
  Filled 2020-05-18 (×6): qty 100

## 2020-05-18 MED ORDER — HEPARIN SODIUM (PORCINE) 1000 UNIT/ML IJ SOLN
INTRAMUSCULAR | Status: AC
  Filled 2020-05-18: qty 4

## 2020-05-18 MED ORDER — PREDNISONE 5 MG PO TABS
25.0000 mg | ORAL_TABLET | Freq: Every day | ORAL | Status: DC
  Administered 2020-05-18 – 2020-05-21 (×4): 25 mg via ORAL
  Filled 2020-05-18 (×4): qty 1

## 2020-05-18 MED ORDER — CROMOLYN SODIUM 20 MG/2ML IN NEBU
20.0000 mg | INHALATION_SOLUTION | Freq: Two times a day (BID) | RESPIRATORY_TRACT | Status: DC | PRN
  Administered 2020-05-21: 20 mg via RESPIRATORY_TRACT
  Filled 2020-05-18: qty 2

## 2020-05-18 MED ORDER — ONDANSETRON 4 MG PO TBDP: 8.0000 mg | ORAL_TABLET | Freq: Four times a day (QID) | ORAL | Status: DC | PRN

## 2020-05-18 MED ORDER — BACLOFEN 10 MG PO TABS
10.0000 mg | ORAL_TABLET | Freq: Four times a day (QID) | ORAL | Status: DC
  Administered 2020-05-18 – 2020-05-22 (×13): 10 mg via ORAL
  Filled 2020-05-18 (×13): qty 1

## 2020-05-18 MED ORDER — DIPHENHYDRAMINE HCL 25 MG PO CAPS: 25.0000 mg | ORAL_CAPSULE | Freq: Four times a day (QID) | ORAL | Status: DC | PRN

## 2020-05-18 MED ORDER — IPRATROPIUM-ALBUTEROL 0.5-2.5 (3) MG/3ML IN SOLN
3.0000 mL | RESPIRATORY_TRACT | Status: DC | PRN
  Administered 2020-05-18 – 2020-05-21 (×4): 3 mL via RESPIRATORY_TRACT
  Filled 2020-05-18 (×4): qty 3

## 2020-05-18 MED ORDER — CALCIUM GLUCONATE-NACL 2-0.675 GM/100ML-% IV SOLN
2.0000 g | Freq: Once | INTRAVENOUS | Status: DC
  Filled 2020-05-18: qty 100

## 2020-05-18 MED ORDER — SODIUM CHLORIDE 0.9% FLUSH: 10.0000 mL | INTRAVENOUS | Status: DC | PRN

## 2020-05-18 MED ORDER — ACETAMINOPHEN 325 MG PO TABS: 650.0000 mg | ORAL_TABLET | ORAL | Status: DC | PRN

## 2020-05-18 MED ORDER — ONDANSETRON 4 MG PO TBDP
6.0000 mg | ORAL_TABLET | Freq: Four times a day (QID) | ORAL | Status: DC | PRN
  Administered 2020-05-18 – 2020-05-22 (×11): 6 mg via ORAL
  Filled 2020-05-18 (×11): qty 2

## 2020-05-18 MED ORDER — HEPARIN SODIUM (PORCINE) 1000 UNIT/ML IJ SOLN
1000.0000 [IU] | Freq: Once | INTRAMUSCULAR | Status: DC
  Filled 2020-05-18: qty 1

## 2020-05-18 MED ORDER — BACLOFEN 10 MG PO TABS
10.0000 mg | ORAL_TABLET | Freq: Four times a day (QID) | ORAL | Status: DC
  Filled 2020-05-18: qty 1

## 2020-05-18 MED ORDER — NON FORMULARY: 135.0000 mg | Status: DC

## 2020-05-18 MED ORDER — ACD FORMULA A 0.73-2.45-2.2 GM/100ML VI SOLN
1000.0000 mL | Status: DC
  Filled 2020-05-18: qty 1000

## 2020-05-18 MED ORDER — CALCIUM CARBONATE ANTACID 500 MG PO CHEW
2.0000 | CHEWABLE_TABLET | ORAL | Status: AC
  Filled 2020-05-18: qty 2

## 2020-05-18 MED ORDER — SENNOSIDES-DOCUSATE SODIUM 8.6-50 MG PO TABS: 1.0000 | ORAL_TABLET | Freq: Every evening | ORAL | Status: DC | PRN

## 2020-05-18 NOTE — Progress Notes (Signed)
Nif- first attempt pt. Able to accomplish -20, second try -25. RT will continue to monitor.

## 2020-05-18 NOTE — Progress Notes (Signed)
Assign nurse refused to be pt's nurse in this shift; thus,  charge nurse took over.During the handoff reporting, pt expressed lots of concerns and main concern was her home prepared tube feeding. Patient is in special diet due to severe anaphylaxis reactions with most of the items. She wanted to use her own home made feeding which need to store in freezer. Since Freezer was not available, patient started panicking,  AC came and talked to pt about not able to freeze her feeding, but can store in ice bucket. Pt disagreed with it, she called 911, wanted to leave, but did not want to sign AMA paper, took off telebox and throw away.  [facility] staff Mr. Jonny Ruiz talked to pt again 2nd times in front of EMR staffs,and  critical care physician also was on bed side. After talked to physician, pt calm down, agreed to stay until further discussion in the physician morning round. Pt used one tube feeling now and store one bag in ice for morning. Rest of other feeding bags Dr. Staci Acosta offered to drop in pt's friend's house and will continue to monitor.

## 2020-05-18 NOTE — Progress Notes (Signed)
Called by RN multiple times about Ms. Brandy [name] as she has been upset and stating she wanted to be transferred to another hospital. Pt was informed that a transfer would not be done tonight as no medical indication for transfer. Pt has been difficult with staff all night and did not have PLEX earlier due to issues with air in line and albumin expired. Planning for PLEX in am. Pt has complained that she could not eat the food she was brought and the food she requires is not being provided to her. She was asked if someone could bring her food from home and she said no. Pt has stopped her D5 1/2 NS stating that she cannot take the dextrose and is not comfortable having it. She states she wants NS but then states if she doesn't eat then her blood sugar drops.  Attempts were made to have her understand that is why she was placed on the D5 half-normal saline to keep her blood sugar from dropping but she refuses to listen to the reasoning.  She is adamant that she wants normal saline and states "I know about fluids and what fluid I need".  Patient is placed on normal saline at 50 mils per hour and will check blood sugars every 1 hour and if she becomes hypoglycemic to be re-started on D5

## 2020-05-18 NOTE — Progress Notes (Signed)
Pt requested to stop the fluids. Fluids stopped at this time.

## 2020-05-18 NOTE — Progress Notes (Signed)
Page sent to RD to see if they can at least talk to her over the phone. Marylene Land, RD was able to get in contact with patient and set a plan to have the kitchen make special rice for her so she can eat something today. Wynona Neat, RD and Kitchen supervisor will be by in the morning to talk with the patient to see what would be best for the patient.

## 2020-05-18 NOTE — Progress Notes (Signed)
Transport was here to take patient down to dialysis for treatment. Pt was concerned that she would not be able to eat her rice for the evening. Asked if the rice can be reheated, Pt said no because of histamines. Called Dialysis to see if she can have her rice downstairs while receiving treatment. Dialysis said that they would not be able to do that. Pt was very upset that she would not be able to eat in dialysis. Called dialysis again to see if treatment can be postponed and was told that the albumin would expired if she does not come down for treatment now. Judie Grieve, RN Charge called kitchen to see if they would be able to prepare and bring the rice once the patient was through with dialysis. Received a call back from Hemodialysis that they would allow for this pt to have her rice. I explained to patient that this is not something that they usually do. Pt they said she was concerned she would not get her medication (gastrocrom) that she needs 10-15 minutes before she eats. I let her know that I would either tube the medication or bring the medication up myself once she has her food. Transport was able to take pt to dialysis.

## 2020-05-18 NOTE — Progress Notes (Signed)
Called [facility] around 1615 to get report before the PLEX; she informed me the pt had not eaten and she needed to bring rice to the HD unit to eat before taking her medication. I advised her that our policy does not allow that kind of food on the unit because its a procedural area. Gerhard Perches, RN back and told I was afraid the albumin would expire and I wd call my unit Director to get an exception. Called Kandis Ban, RN at [phone] to inform her the patient was on a special diet and needed to bring rice on the unit to eat before she get her medications. Kandis Ban, HD Director gave permission as a exception. Nutrition brought the rice before the pt arrived on the unit around1645. Patient arrived on the unit around 1710, agitated, very anxious, did not want to be on the unit because there too many people; she did not want to be touched; she wrote on paper she  was allergic to perfume, detergent and mint. We closed the curtains to give privacy and tried to calm her down unsuccessfully; Called Burley Saver, HD liason to help with the situation; she was also not able to calm her down after 30 mins of trying. I eventually managed to ask her if it was better to do the PLEX in her room and she agreed. She was transported back to her room around 1755.  I gathered my supplies and the machine to go to 3W24 around 1815 to go to 3W. Upon arrival I arranged  the room to position the PLEX machine; the machine was turned on air lock error appeared on the screen. I tried to clear the air error without success; around 1846; I had no option but to end the treatment. I told the pt I wd not be able to resetup  the machine run the tx on time because the albumin was expiring at 1945. Called Dr. Timmie Foerster, Neurology at at 1951 explained the situation and informed the PLEX had not been done and it would be done on 05/19/20 in the morning. Also spoke to [employer], Consulting civil engineer on 3W and explained why the PLEX and explained the  situation.

## 2020-05-18 NOTE — Progress Notes (Signed)
TPE cannot be done tonight. They had moved the patient to the room and tried to set her up to do the treatment here in her room but air got into the system and even if they re-set it up the albumin would have expired. Pt is having another panic attack in the room. Hospitalist notified.

## 2020-05-18 NOTE — Progress Notes (Signed)
NIF = -30 with good effort and understanding by patient.

## 2020-05-18 NOTE — Progress Notes (Signed)
Consult for HD dressing change. Blood noted on biopatch. Does not appear to be actively bleeding.   Attempted to clean table for dressing supplies, pt reported her eyes were burning and she could not see. Table placed in hallway. RN notified. RN will re-enter consult when pt is ready for dressing change.

## 2020-05-18 NOTE — Treatment Plan (Signed)
Called to bedside as patient upset and had called 911 as she wished to leave the hospital. Had extensive discussion regarding her concerns. Primary concern was regarding the tube feed preparations a friend had brought from home (patient had previously prepared herself by processing foods she can tolerate). Patient uses this preparation regularly at home but had not been given permission to administer it in the hospital. Given that patient regularly administers this home prepared tube feed to herself at home I did not see a barrier to her continuing that here and had nursing provide a syringe and sterile saline for patient to self administer and flush her G-tube. Second main concern was that if her home prepared tube feeds thawed out they would go to waste due to the presence of histamines she can't tolerate. After discussion with nursing including charge nurse and nursing supervisor, we could not find a freezer appropriate for this use. Patient was understandably upset, and wished to return the remaining tube feed preparations to her friends house so they would not go to waste, however friend is unable to pick them up. Given the unique situation I offered to drop them off after my shift, which patient agreed to. Patient calm and appropriate after discussion

## 2020-05-18 NOTE — Progress Notes (Signed)
NEUROLOGY CONSULTATION PROGRESS NOTE   Date of service: May 18, 2020 Patient Name: Brandy [name] MRN:  [id number] DOB:  11-29-1990  Brief HPI   Brandy [name] is a 29 y.o. female with PMH significant for mast cell activation syndrome, Ehler danlos with cervical instability, POTS, seronegative myasthenia with inconclusive LRP4 Ab who presents with drooping eyelids, SOB, facial diplegia concerning for Myasthenia exacerbation. Improved speech and lid ptosis with 1st PLEX.   Interval Hx   Second PLEX today. Discussed with her that as a hospital, we are not equiped well enough to handle all of her requests. We will try our best but we may not be able to store her tube feeds with the meds. I will try my best to schedule her Myasthenia medications as close to her home timing as possible but she understands that we have to triage our resources to take care of the sickest patient's first and her medications could be delayed if the staff have other responsibilities that require immediate attention. She has very good insight and understands the reality of our sitatuation here and that we are partners and here to help her out and is appreciative of the care.  Vitals   Vitals:   05/17/20 1919 05/18/20 0030 05/18/20 0323 05/18/20 0810  BP:  130/84 110/81 127/73  Pulse:  69 70 73  Resp:  20 20 18   Temp:    98.5 F (36.9 C)  TempSrc:    Axillary  SpO2: 100% 100% 99% 100%  Weight:      Height:         Body mass index is 18.88 kg/m.  Physical Exam   General: Laying comfortably in bed; in no acute distress. HENT: Normal oropharynx and mucosa. Normal external appearance of ears and nose. Neck: Supple, no pain or tenderness CV: No JVD. No peripheral edema. Pulmonary: Symmetric Chest rise. Normal respiratory effort. Abdomen: Soft to touch, non-tender. Ext: No cyanosis, edema, or deformity  Skin: No rash. Normal palpation of skin.   Musculoskeletal: Normal digits and nails by inspection. No  clubbing.   Neurologic Examination  Mental status/Cognition: Alert, oriented to self, place, month and year, good attention. Speech/language: Can speak in full sentences today. Fluent, comprehension intact, object naming intact, repetition intact. Cranial nerves:   CN II Pupils equal and reactive to light, no VF deficits   CN III,IV,VI EOM intact, no gaze preference or deviation, no nystagmus   CN V normal sensation in V1, V2, and V3 segments bilaterally   CN VII no asymmetry, no nasolabial fold flattening   CN VIII normal hearing to speech   CN IX & X normal palatal elevation, no uvular deviation   CN XI 5/5 head turn and 5/5 shoulder shrug bilaterally   CN XII midline tongue protrusion   Motor:  Muscle bulk: poor, tone normal, pronator drift none tremor none Mvmt Root Nerve  Muscle Right Left Comments  SA C5/6 Ax Deltoid 5 4+   EF C5/6 Mc Biceps 5 4+   EE C6/7/8 Rad Triceps 5 4+   WF C6/7 Med FCR 5 5   WE C7/8 PIN ECU 5 5   F Ab C8/T1 U ADM/FDI 5 5   HF L1/2/3 Fem Illopsoas 4+ 4+   KE L2/3/4 Fem Quad     DF L4/5 D Peron Tib Ant 5 5   PF S1/2 Tibial Grc/Sol 5 5    Unable to test neck flexion and extension due to a chronic C collar in place due  to hx of Cervical instability due to [facility].  Sensation:  Light touch Intact throughout   Pin prick    Temperature    Vibration   Proprioception     Labs   Basic Metabolic Panel:  Lab Results  Component Value Date   NA 139 05/18/2020   K 3.0 (L) 05/18/2020   CO2 26 05/18/2020   GLUCOSE 102 (H) 05/18/2020   BUN 5 (L) 05/18/2020   CREATININE 0.75 05/18/2020   CALCIUM 8.6 (L) 05/18/2020   GFRNONAA >60 05/18/2020   HbA1c:  Lab Results  Component Value Date   HGBA1C 4.7 (L) 05/16/2020   LDL: No results found for: [facility] Urine Drug Screen: No results found for: LABOPIA, COCAINSCRNUR, LABBENZ, AMPHETMU, THCU, LABBARB  Alcohol Level No results found for: ETH No results found for: PHENYTOIN, ZONISAMIDE, LAMOTRIGINE,  LEVETIRACETA No results found for: PHENYTOIN, PHENOBARB, VALPROATE, CBMZ  Imaging and Diagnostic studies      Impression   Brandy [name] is a 29 y.o. female with PMH significant for mast cell activation syndrome, Ehler danlos with cervical instability, POTS, seronegative myasthenia with inconclusive LRP4 Ab who presents with myasthenia exacerbation. Neuro exam today with improved facial diplegia and now speaking in full sentences. Will continue with PLEX.   If she shows significant improvement after 3rd session, can be discharged with outpatient follow up.  Recommendations  - PLEX every other day x 5 sessions ordered. - NIFs and VC to Q8H and transfer out of ICU given improvement. - NPO until swallow eval - Recommend elective intubation for respiratory compromise ifVC falls below 15 to 20 mL/kgand or NIFs falls below -20cm/H2O.If poor effort and not sure, can always get ABG to assess for CO2 retention. Elective intubation for airway cmopromise if she has difficulty clearing her secretions. Oxygen saturation should not be used to make decision regarding intubation. - Recommend Follow up with Dr. Nita Sickle with Neuromuscular team outpatient. -Recommendcontinuing MestinonPO 90mg  every 3 hours. If unable to swallow, can switch fromPO to IV.(30mg  PO is equivalent to 1mg  IV). -Medications that may worsen or trigger MG exacerbation: Class IA antiarrhythmics, magnesium, flouroquinolones, macrolides, aminoglycosides, penicillamine, curare, interferon alpha, botox, quinine. Use with caution:calcium channel blocker,beta blockers andstatins. ______________________________________________________________________   Thank you for the opportunity to take part in the care of this patient. If you have any further questions, please contact the neurology consultation attending.  Signed,  [facility] Pager Number 

## 2020-05-18 NOTE — Progress Notes (Signed)
Pt arrived back from HD. HD nurse says that she was having a panic attack and was crying and shaking. Hospitalist had order a one time does of xanax which patient refused. Dialysis treatment will now be done in patient's room. The rice was delivered to pt in HD but the pt did not feel comfortable taking mask off in order to eat. I have called the kitchen to see if they can redo the rice since she says that she can no longer eat it since it has been sitting out for too long.

## 2020-05-18 NOTE — Progress Notes (Signed)
PROGRESS NOTE    Brandy [name]  QIW:[id number] DOB: Apr 16, 1991 DOA: 05/16/2020 PCP: Patient, No Pcp Per   Brief Narrative:  29 year old with history of myasthenia gravis, Ehlers-Danlos with cervical instability, dysautonomia, wasting syndrome, malabsorption, muscle activation disorder, POTS, GERD, IBS, partial seizure, asthma, anxiety presented with 1 month of progressive weakness and shortness of breath.  Initially PCCM was consulted as there was concerns about ability to maintain and protect her airway.  Neurology was consulted and she was started on Plex treatment every other day X5 doses.   Assessment & Plan:   Active Problems:   Myasthenia exacerbation (HCC)   Acute exacerbation of myasthenia gravis -Currently patient is getting plasmapheresis per neurology recommendations.  Will be total of 5 doses every other day -Continue Mestinon -Steroids per neurology team -Daily NIF/VC  Severe protein calorie malnutrition/cachexia with BMI of 18 -Has PEG tube in place.  Refusing tube feeds.  Patient is special dietary needs due to mast cell disorder therefore I have consulted nutrition team to help assist with this. -Continue IV fluids-D5 half-normal saline, monitor urine output  Mast cell disorder -Daily prednisone, zafirlukast, Xyzal    DVT prophylaxis: heparin injection 5,000 Units Start: 05/16/20 1400 SCDs Start: 05/16/20 1012  Code Status: Full code Family Communication:    Status is: Inpatient  Remains inpatient appropriate because:Inpatient level of care appropriate due to severity of illness   Dispo: The patient is from: Home              Anticipated d/c is to: Home              Anticipated d/c date is: > 3 days              Patient currently is not medically stable to d/c.  Requires inpatient plasmapheresis treatment, total of 5 treatments every other day.   Body mass index is 18.88 kg/m.    Subjective: Patient seen and examined at bedside this morning.  She  is very worried about her nutrition as she requires very special diet.  Have advised her that we will consult nutrition team to try and best help accommodate her needs in the hospital.  She is also worried that she needs to get her steroids which has been restarted by neurologist.   Examination:  Constitutional: Cachectic bilateral temporal wasting chronically ill-appearing.  C-collar in place. Respiratory: Clear to auscultation bilaterally Cardiovascular: Normal sinus rhythm, no rubs Abdomen: Nontender nondistended good bowel sounds Musculoskeletal: No edema noted Skin: No rashes seen Neurologic: CN 2-12 grossly intact.  And nonfocal Psychiatric: Normal judgment and insight. Alert and oriented x 3. Normal mood.  J-tube and G-tube in place External female catheter Central line in place  Objective: Vitals:   05/17/20 1853 05/17/20 1919 05/18/20 0030 05/18/20 0323  BP: (!) 156/104  130/84 110/81  Pulse: (!) 104  69 70  Resp:   20 20  Temp: 98.5 F (36.9 C)     TempSrc: Oral     SpO2: 100% 100% 100% 99%  Weight:      Height:        Intake/Output Summary (Last 24 hours) at 05/18/2020 0807 Last data filed at 05/17/2020 1742 Gross per 24 hour  Intake 160.14 ml  Output 900 ml  Net -739.86 ml   Filed Weights   05/16/20 1026 05/16/20 1640  Weight: 49.9 kg 49.9 kg     Data Reviewed:   CBC: Recent Labs  Lab 05/16/20 0733 05/17/20 0551  WBC 5.2 9.5  NEUTROABS 3.0  --   HGB 9.5* 11.3*  HCT 30.4* 35.5*  MCV 88.9 87.7  PLT 168 177   Basic Metabolic Panel: Recent Labs  Lab 05/16/20 0733 05/16/20 1502 05/17/20 0551  NA 143  --  138  K 3.4*  --  3.2*  CL 107  --  105  CO2 27  --  23  GLUCOSE 97  --  70  BUN 10  --  8  CREATININE 0.60 0.63 0.79  CALCIUM 8.5*  --  8.5*  MG  --  2.1 1.9  PHOS  --  4.3 4.1   GFR: Estimated Creatinine Clearance: 82.5 mL/min (by C-G formula based on SCr of 0.79 mg/dL). Liver Function Tests: Recent Labs  Lab 05/16/20 1502  AST  18  ALT 18  ALKPHOS 57  BILITOT 1.8*  PROT 5.9*  ALBUMIN 3.9   No results for input(s): LIPASE, AMYLASE in the last 168 hours. No results for input(s): AMMONIA in the last 168 hours. Coagulation Profile: No results for input(s): INR, PROTIME in the last 168 hours. Cardiac Enzymes: No results for input(s): CKTOTAL, CKMB, CKMBINDEX, TROPONINI in the last 168 hours. BNP (last 3 results) No results for input(s): PROBNP in the last 8760 hours. HbA1C: Recent Labs    05/16/20 1502  HGBA1C 4.7*   CBG: Recent Labs  Lab 05/17/20 0855 05/17/20 1104 05/17/20 1657 05/17/20 2131 05/18/20 0321  GLUCAP 149* 80 80 86 89   Lipid Profile: No results for input(s): CHOL, HDL, LDLCALC, TRIG, CHOLHDL, LDLDIRECT in the last 72 hours. Thyroid Function Tests: No results for input(s): TSH, T4TOTAL, FREET4, T3FREE, THYROIDAB in the last 72 hours. Anemia Panel: No results for input(s): VITAMINB12, FOLATE, FERRITIN, TIBC, IRON, RETICCTPCT in the last 72 hours. Sepsis Labs: No results for input(s): PROCALCITON, LATICACIDVEN in the last 168 hours.  Recent Results (from the past 240 hour(s))  Resp Panel by RT-PCR (Flu A&B, Covid) Nasopharyngeal Swab     Status: None   Collection Time: 05/16/20  8:15 AM   Specimen: Nasopharyngeal Swab; Nasopharyngeal(NP) swabs in vial transport medium  Result Value Ref Range Status   SARS Coronavirus 2 by RT PCR NEGATIVE NEGATIVE Final    Comment: (NOTE) SARS-CoV-2 target nucleic acids are NOT DETECTED.  The SARS-CoV-2 RNA is generally detectable in upper respiratory specimens during the acute phase of infection. The lowest concentration of SARS-CoV-2 viral copies this assay can detect is 138 copies/mL. A negative result does not preclude SARS-Cov-2 infection and should not be used as the sole basis for treatment or other patient management decisions. A negative result may occur with  improper specimen collection/handling, submission of specimen other than  nasopharyngeal swab, presence of viral mutation(s) within the areas targeted by this assay, and inadequate number of viral copies(<138 copies/mL). A negative result must be combined with clinical observations, patient history, and epidemiological information. The expected result is Negative.  Fact Sheet for Patients:  BloggerCourse.com  Fact Sheet for Healthcare Providers:  SeriousBroker.it  This test is no t yet approved or cleared by the [place] FDA and  has been authorized for detection and/or diagnosis of SARS-CoV-2 by FDA under an Emergency Use Authorization (EUA). This EUA will remain  in effect (meaning this test can be used) for the duration of the COVID-19 declaration under Section 564(b)(1) of the Act, 21 U.S.C.section 360bbb-3(b)(1), unless the authorization is terminated  or revoked sooner.       Influenza A by PCR NEGATIVE NEGATIVE Final   Influenza  B by PCR NEGATIVE NEGATIVE Final    Comment: (NOTE) The Xpert Xpress SARS-CoV-2/FLU/RSV plus assay is intended as an aid in the diagnosis of influenza from Nasopharyngeal swab specimens and should not be used as a sole basis for treatment. Nasal washings and aspirates are unacceptable for Xpert Xpress SARS-CoV-2/FLU/RSV testing.  Fact Sheet for Patients: BloggerCourse.com  Fact Sheet for Healthcare Providers: SeriousBroker.it  This test is not yet approved or cleared by the [place] FDA and has been authorized for detection and/or diagnosis of SARS-CoV-2 by FDA under an Emergency Use Authorization (EUA). This EUA will remain in effect (meaning this test can be used) for the duration of the COVID-19 declaration under Section 564(b)(1) of the Act, 21 U.S.C. section 360bbb-3(b)(1), unless the authorization is terminated or revoked.  Performed at [facility], [address] [place], [place] [address]    MRSA PCR Screening     Status: None   Collection Time: 05/16/20  3:02 PM   Specimen: Nasal Mucosa; Nasopharyngeal  Result Value Ref Range Status   MRSA by PCR NEGATIVE NEGATIVE Final    Comment:        The GeneXpert MRSA Assay (FDA approved for NASAL specimens only), is one component of a comprehensive MRSA colonization surveillance program. It is not intended to diagnose MRSA infection nor to guide or monitor treatment for MRSA infections. Performed at [facility], [address] [place], [place] [address]          Radiology Studies: DG CHEST PORT 1 VIEW  Result Date: 05/16/2020 CLINICAL DATA:  Central line placement EXAM: PORTABLE CHEST 1 VIEW COMPARISON:  05/16/2020 FINDINGS: Power injectable right Port-A-Cath tip: SVC. Dual lumen left subclavian catheter tip: SVC. No pneumothorax. Cardiac and mediastinal margins appear normal. A cervical collar is in place. Tubing projects over the upper abdomen. IMPRESSION: 1. Dual lumen left subclavian catheter tip: SVC. No pneumothorax. 2. Right Port-A-Cath tip: SVC. Electronically Signed   By: Gaylyn Rong M.D.   On: 05/16/2020 16:36   DG Chest Portable 1 View  Result Date: 05/16/2020 CLINICAL DATA:  Difficulty swallowing and muscle weakness. EXAM: PORTABLE CHEST 1 VIEW COMPARISON:  None. FINDINGS: There is a right chest wall port a catheter with tip in the distal SVC. Heart size normal. No pleural effusion or edema identified. No airspace densities identified. IMPRESSION: No active disease. Electronically Signed   By: Signa Kell M.D.   On: 05/16/2020 08:32        Scheduled Meds: . budesonide (PULMICORT) nebulizer solution  0.5 mg Nebulization BID  . cromolyn  200 mg Oral TID AC & HS  . cromolyn  20 mg Nebulization TID  . famotidine  20 mg Oral Q12H  . feeding supplement (GLUCERNA 1.2 CAL)  1,000 mL Per Tube Q24H  . Fluticasone-Salmeterol  1 puff Inhalation BID  . heparin  5,000 Units Subcutaneous Q8H  .  insulin aspart  0-6 Units Subcutaneous Q4H  . ipratropium-albuterol  3 mL Nebulization BID  . ivabradine  5 mg Oral BID WC  . levocetirizine  5 mg Oral Q12H  . pyridostigmine  90 mg Oral Q3H  . vitamin B-12  1,000 mcg Oral Daily  . zafirlukast  20 mg Oral BID AC   Continuous Infusions: . citrate dextrose       LOS: 2 days   Time spent= 35 mins    Lorilei Horan Joline Maxcy, MD [facility]  If 7PM-7AM, please contact night-coverage  05/18/2020, 8:07 AM

## 2020-05-18 NOTE — Progress Notes (Signed)
Patient home medications counted and handed to [facility] inpatient pharmacy.

## 2020-05-18 NOTE — Progress Notes (Signed)
Left message with patient experienced asking them to visit the patient about her concerns. Also provided patient with patient experience phone number, patient stated she has already called them.

## 2020-05-18 NOTE — Progress Notes (Signed)
Called to see pt in room having a melt down, crying and requesting to leave, c/o of not getting her treatment and hungry, her HD Cath observed to be oozing blood around it, however pt refused me touching it, requesting to see the doctor, both Neurologist on call and the [facility] Doc on call paged. The Dialysis Nurse also called me to explain why pt did not receive her treatment today, that the albumin got expired before the treatment,  Her food was delivered to her in dialysis but pt said she could not eat there. The [facility] John also came up to speak with pt, she is still insisting on leaving with a Taxi, pt was however reassured, waiting to speak with the Doctors. [name], Brandy [name]

## 2020-05-18 NOTE — Progress Notes (Signed)
Informed the pt that RD would be by tomorrow morning  to speak with her. Pt was very upset by the information and requested to speak to [facility] and patient experience. Notified charge nurse.

## 2020-05-18 NOTE — Progress Notes (Addendum)
Attempted to get pt's food allergy list was cut short by transport arrival to Dialysis.  Attempted to add some allergies to list but was unable to. Also was unable to finish asking for all her food allergies but here are some she could tell me: Dairy, Meats, Broccolli, Potato

## 2020-05-19 DIAGNOSIS — D8940 Mast cell activation, unspecified: Secondary | ICD-10-CM | POA: Diagnosis not present

## 2020-05-19 DIAGNOSIS — E43 Unspecified severe protein-calorie malnutrition: Secondary | ICD-10-CM | POA: Diagnosis not present

## 2020-05-19 DIAGNOSIS — G7001 Myasthenia gravis with (acute) exacerbation: Secondary | ICD-10-CM | POA: Diagnosis not present

## 2020-05-19 LAB — COMPREHENSIVE METABOLIC PANEL
ALT: 14 U/L (ref 0–44)
AST: 14 U/L — ABNORMAL LOW (ref 15–41)
Albumin: 4 g/dL (ref 3.5–5.0)
Alkaline Phosphatase: 34 U/L — ABNORMAL LOW (ref 38–126)
Anion gap: 8 (ref 5–15)
BUN: <5 mg/dL — ABNORMAL LOW (ref 6–20)
CO2: 25 mmol/L (ref 22–32)
Calcium: 8.4 mg/dL — ABNORMAL LOW (ref 8.9–10.3)
Chloride: 102 mmol/L (ref 98–111)
Creatinine, Ser: 0.75 mg/dL (ref 0.44–1.00)
GFR, Estimated: >60 mL/min (ref >60)
Glucose, Bld: 94 mg/dL (ref 70–99)
Potassium: 3.3 mmol/L — ABNORMAL LOW (ref 3.5–5.1)
Sodium: 135 mmol/L (ref 135–145)
Total Bilirubin: 2.1 mg/dL — ABNORMAL HIGH (ref 0.3–1.2)
Total Protein: 5.3 g/dL — ABNORMAL LOW (ref 6.5–8.1)

## 2020-05-19 LAB — CBC
HCT: 34.1 % — ABNORMAL LOW (ref 36.0–46.0)
Hemoglobin: 11 g/dL — ABNORMAL LOW (ref 12.0–15.0)
MCH: 27.8 pg (ref 26.0–34.0)
MCHC: 32.3 g/dL (ref 30.0–36.0)
MCV: 86.1 fL (ref 80.0–100.0)
Platelets: 174 10*3/uL (ref 150–400)
RBC: 3.96 MIL/uL (ref 3.87–5.11)
RDW: 15.9 % — ABNORMAL HIGH (ref 11.5–15.5)
WBC: 8.9 10*3/uL (ref 4.0–10.5)
nRBC: 0 % (ref 0.0–0.2)

## 2020-05-19 LAB — GLUCOSE, CAPILLARY
Glucose-Capillary: 105 mg/dL — ABNORMAL HIGH (ref 70–99)
Glucose-Capillary: 109 mg/dL — ABNORMAL HIGH (ref 70–99)
Glucose-Capillary: 122 mg/dL — ABNORMAL HIGH (ref 70–99)
Glucose-Capillary: 122 mg/dL — ABNORMAL HIGH (ref 70–99)
Glucose-Capillary: 131 mg/dL — ABNORMAL HIGH (ref 70–99)
Glucose-Capillary: 89 mg/dL (ref 70–99)
Glucose-Capillary: 90 mg/dL (ref 70–99)

## 2020-05-19 LAB — IRON AND TIBC: Iron: 33 ug/dL (ref 28–170)

## 2020-05-19 LAB — FOLATE: Folate: 11.4 ng/mL (ref >5.9)

## 2020-05-19 LAB — RETICULOCYTES
Immature Retic Fract: 12.9 % (ref 2.3–15.9)
RBC.: 4.17 MIL/uL (ref 3.87–5.11)
Retic Count, Absolute: 59.2 10*3/uL (ref 19.0–186.0)
Retic Ct Pct: 1.4 % (ref 0.4–3.1)

## 2020-05-19 LAB — PHOSPHORUS
Phosphorus: 3.8 mg/dL (ref 2.5–4.6)
Phosphorus: 3 mg/dL (ref 2.5–4.6)

## 2020-05-19 LAB — MAGNESIUM
Magnesium: 2.1 mg/dL (ref 1.7–2.4)
Magnesium: 1.9 mg/dL (ref 1.7–2.4)

## 2020-05-19 LAB — VITAMIN B12: Vitamin B-12: 445 pg/mL (ref 180–914)

## 2020-05-19 LAB — VITAMIN D 25 HYDROXY (VIT D DEFICIENCY, FRACTURES): Vit D, 25-Hydroxy: 22.45 ng/mL — ABNORMAL LOW (ref 30–100)

## 2020-05-19 LAB — FERRITIN: Ferritin: 6 ng/mL — ABNORMAL LOW (ref 11–307)

## 2020-05-19 MED ORDER — VITAMIN D (ERGOCALCIFEROL) 1.25 MG (50000 UNIT) PO CAPS
50000.0000 [IU] | ORAL_CAPSULE | ORAL | Status: DC
  Filled 2020-05-19 (×2): qty 1

## 2020-05-19 MED ORDER — VITAMIN B-12 1000 MCG PO TABS
1000.0000 ug | ORAL_TABLET | Freq: Every day | ORAL | Status: DC
  Administered 2020-05-19 – 2020-05-21 (×3): 1000 ug via ORAL
  Filled 2020-05-19 (×3): qty 1

## 2020-05-19 MED ORDER — CALCIUM GLUCONATE-NACL 2-0.675 GM/100ML-% IV SOLN
2.0000 g | Freq: Once | INTRAVENOUS | Status: AC
  Administered 2020-05-19: 2000 mg via INTRAVENOUS
  Filled 2020-05-19: qty 100

## 2020-05-19 MED ORDER — POTASSIUM CHLORIDE 10 MEQ/100ML IV SOLN
10.0000 meq | INTRAVENOUS | Status: DC
  Filled 2020-05-19 (×3): qty 100

## 2020-05-19 MED ORDER — POTASSIUM CHLORIDE 10 MEQ/100ML IV SOLN
10.0000 meq | INTRAVENOUS | Status: AC
  Administered 2020-05-19 (×4): 10 meq via INTRAVENOUS
  Filled 2020-05-19: qty 100

## 2020-05-19 MED ORDER — ENOXAPARIN SODIUM 30 MG/0.3ML ~~LOC~~ SOLN
30.0000 mg | SUBCUTANEOUS | Status: DC
  Administered 2020-05-20: 30 mg via SUBCUTANEOUS
  Filled 2020-05-19 (×3): qty 0.3

## 2020-05-19 MED ORDER — HEPARIN SODIUM (PORCINE) 1000 UNIT/ML IJ SOLN
1000.0000 [IU] | Freq: Once | INTRAMUSCULAR | Status: AC
  Administered 2020-05-19: 3800 [IU]

## 2020-05-19 MED ORDER — FAMOTIDINE 20 MG PO TABS
40.0000 mg | ORAL_TABLET | Freq: Two times a day (BID) | ORAL | Status: DC
  Administered 2020-05-19 – 2020-05-20 (×3): 40 mg via ORAL
  Filled 2020-05-19 (×4): qty 2
  Filled 2020-05-19: qty 4
  Filled 2020-05-19: qty 2

## 2020-05-19 MED ORDER — CROMOLYN SODIUM 100 MG/5ML PO CONC
200.0000 mg | ORAL | Status: DC
  Administered 2020-05-19 – 2020-05-21 (×11): 200 mg via ORAL
  Filled 2020-05-19 (×8): qty 10

## 2020-05-19 MED ORDER — DIPHENHYDRAMINE HCL 25 MG PO CAPS: 25.0000 mg | ORAL_CAPSULE | Freq: Four times a day (QID) | ORAL | Status: DC | PRN

## 2020-05-19 MED ORDER — SODIUM CHLORIDE 0.9 % IV SOLN
INTRAVENOUS | Status: AC
  Filled 2020-05-19 (×4): qty 200

## 2020-05-19 MED ORDER — ACD FORMULA A 0.73-2.45-2.2 GM/100ML VI SOLN: 1000.0000 mL | Status: DC

## 2020-05-19 MED ORDER — ACETAMINOPHEN 325 MG PO TABS: 650.0000 mg | ORAL_TABLET | ORAL | Status: DC | PRN

## 2020-05-19 MED ORDER — CALCIUM CARBONATE ANTACID 500 MG PO CHEW
2.0000 | CHEWABLE_TABLET | ORAL | Status: DC
  Filled 2020-05-19: qty 2

## 2020-05-19 NOTE — Progress Notes (Signed)
NEUROLOGY CONSULTATION PROGRESS NOTE   Date of service: May 19, 2020 Patient Name: Brandy [name] MRN:  [id number] DOB:  01-25-1991  Brief HPI   Brandy [name] is a 29 y.o. female with PMH significant for mast cell activation syndrome, Ehler danlos with cervical instability, POTS, seronegative myasthenia with inconclusive LRP4 Ab who presents with drooping eyelids, SOB, facial diplegia concerning for Myasthenia exacerbation. Improved speech and lid ptosis with 1st PLEX.    Interval Hx   Could not complete 2nd PLEX yesterday - see nursing notes. Remains extremely anxious and dissatisfied with the service being provided to her-including multiple issues: Medication timings, food, not being able to sleep.  Vitals   Vitals:   05/18/20 1649 05/18/20 2324 05/19/20 0321 05/19/20 0346  BP: 134/85 133/86 129/83   Pulse: 79 71 79   Resp: 18 17 17    Temp: (!) 97.4 F (36.3 C) 98 F (36.7 C) 98 F (36.7 C)   TempSrc: Oral Oral Oral   SpO2: 100% 100% 100%   Weight:    44.2 kg  Height:         Body mass index is 16.73 kg/m.  Physical Exam   General: Laying comfortably in bed; in no acute distress. HENT: Normal oropharynx and mucosa. Normal external appearance of ears and nose. Neck: Supple, no pain or tenderness CV: No JVD. No peripheral edema. Pulmonary: Symmetric Chest rise. Normal respiratory effort. Abdomen: Soft to touch, non-tender. Ext: No cyanosis, edema, or deformity  Skin: No rash. Normal palpation of skin.   Musculoskeletal: Normal digits and nails by inspection. No clubbing.  Refused to participate with the formal examination citing reasons that any kind small including that of mild [place] exacerbate her symptoms. I was able to perform a cursory examination: Pupils equal round reactive to light, no extraocular movement restriction, mild left ptosis. On forced upward gaze, she had fluttering of both of her eyelids and then kept closing her eyes. Reports off-and-on  diplopia. Raises both arms up against gravity without any drift. Both lower extremities are difficult to examine without full participation by right at least antigravity.   Labs   Basic Metabolic Panel:  Lab Results  Component Value Date   NA 135 05/19/2020   K 3.3 (L) 05/19/2020   CO2 25 05/19/2020   GLUCOSE 94 05/19/2020   BUN <5 (L) 05/19/2020   CREATININE 0.75 05/19/2020   CALCIUM 8.4 (L) 05/19/2020   GFRNONAA >60 05/19/2020   HbA1c:  Lab Results  Component Value Date   HGBA1C 4.7 (L) 05/16/2020   LDL: No results found for: [facility] Urine Drug Screen: No results found for: LABOPIA, COCAINSCRNUR, LABBENZ, AMPHETMU, THCU, LABBARB  Alcohol Level No results found for: ETH No results found for: PHENYTOIN, ZONISAMIDE, LAMOTRIGINE, LEVETIRACETA No results found for: PHENYTOIN, PHENOBARB, VALPROATE, CBMZ  Imaging and Diagnostic studies      Assessment   Brandy [name] is a 29 y.o. female with PMH significant for mast cell activation syndrome, Ehler danlos with cervical instability, POTS, seronegative myasthenia with inconclusive LRP4 Ab who presents with myasthenia exacerbation.   Discussed the case with Dr. [id number], outpatient neuromuscular specialist with available information. Important considerations in her case: -She is on abnormally high doses of steroids, which is not for myasthenia but for her mast cell activation syndrome-from a neurological standpoint she does not require extremely high doses of steroids. -She is not on any steroid sparing agent and I do not know what the discussion had been in terms of keeping her  or transitioning her from steroids to a steroid sparing agent. -She is also on abnormally high doses and abnormally high frequency of pyridostigmine.  Impression -?myasthenic crisis  Recommendations  At this time, I would complete second round of Plex today. If she is unwilling to stay in the hospital for whatever reasons, and she has a tunneled  plasma exchange catheter, she can be discharged home and come in for plasma exchange as outpatient to complete total 5 doses. I discussed this with the outpatient neuromuscular specialist who feels that it would be prudent to complete 5 rounds of plasma exchange. As far as medications are concerned: -From a neurological standpoint, prednisone 25 mg daily is sufficient for the myasthenia -She is taking pyridostigmine every 3 hours around-the-clock. I would recommend changing it to a point where she is not woken up from sleep to give her pyridostigmine. She can continue to take it every 3 hours while awake and does not need to take it at night.  I discussed my plan in detail with Dr. Nelson Chimes. I will revisit her after her plasma exchange is completed. I will discuss the above with her when I revisited.   -- Milon Dikes, MD [facility] Pager: [phone] If 7pm to 7am, please call on call as listed on AMION.

## 2020-05-19 NOTE — Progress Notes (Signed)
RT note. Attempted to have pt. Perform Nif at this time. Pt. Asked if she could go back to sleep because she had just fallen asleep. Rt will check back in with pt. At a later time

## 2020-05-19 NOTE — Progress Notes (Signed)
HD cath dressing change: Bleeding. Site cleaned to the extent pt would allow. Intolerant to friction at site. Pt reports she does not think she has a true chlorhexidine allergy, reports it causes rash "sometimes".  Biopatch applied. Pt requested dressing without paper border. Tegaderm applied to site.

## 2020-05-19 NOTE — Progress Notes (Signed)
Initial Nutrition Assessment  DOCUMENTATION CODES:   Severe malnutrition in context of chronic illness  INTERVENTION:   Recommend pt have TPN initiated as she is severely malnourished and her home TF regimen and PO Intake are insufficient to meet her needs for maintenance and repletion. MD would like to avoid TPN at this time.  Monitor magnesium, potassium, and phosphorus daily for at least 3 days, MD to replete as needed, as pt is at risk for refeeding syndrome.  Recommend beginning pt on thiamine  Pt is at very high risk for multiple micronutrient deficiencies; will check Thiamine B1, Vitamin B12, Folate B9, Vitamin A, Vitamin D, Vitamin C, Copper, Selenium, Zinc, Iron panel, vitamin B6  Will d/c order for Glucerna 1.2 cal as pt has refused all hospital TF  Pt was provided with a facility-approved freezer to keep in her room and pt is to have her preferred home tube feeding blend brought to her and stored in her room. Pt/RN responsible for administration. Pt agreeable to plan.  Pt will also keep snacks that she has approved in her room to eat at her discretion   NUTRITION DIAGNOSIS:   Severe Malnutrition related to chronic illness (myasthenia gravis; mast cell activation disorder) as evidenced by energy intake < or equal to 75% for > or equal to 1 month, percent weight loss, moderate fat depletion, mild fat depletion, severe muscle depletion, moderate muscle depletion, mild muscle depletion.    GOAL:   Patient will meet greater than or equal to 90% of their needs    MONITOR:   Labs, Weight trends, I & O's  REASON FOR ASSESSMENT:   Consult Assessment of nutrition requirement/status  ASSESSMENT:   Pt with PMH significant for MG, Ehler Danlos w/ cervical instability, dysautonomia, wasting syndrome, malabsorption, mast cell activation disorder, POTS, GERD, IBS, partial seizures, anxiety, asthma presenting with 1 month progressive weakness now accompanied by worsening SOB  and inability to hold head up concerning for possible myasthenia exacerbation. Pt is wheelchair bound at baseline.  Pt moved from [place] to [place] on 11/23 which she feels led to the MG flare.  Neurology has recommended that pt receive PLEX 0.4g/kg every other day x5 doses.    Pt has a feeding tube that she uses for feeding at home. Pt reports she had a PEG tube placed initially, then later had J-tube placed; however, it is unclear at this time if pt has been able to successfully feed/medicate through the J-tube. Pt confirms that she does not currently use the J-tube and provides her tube feeding through the G-tube when she feels she can tolerate it.   Pt makes her own tube feeding as she reports having many food and medication allergies and having severe reactions to commercial/standardized tube feeding. Pt is refusing all tube feeding options on the hospital's formulary, including [employer]. Pt currently has active order for Glucerna 1.2, but has not received as pt has refused. Pt wanted to have her frozen home TF bags brought to the hospital, but was informed that there was no safe option for storing her tube feed. Pt was unable to provide many details regarding the specific measurements of her home TF regimen as it has recently changed, but pt typically includes celery, pears, rutabaga, rice, lentils, and water. Pt blends this and puts it into 100ml bags that she uses for her TF. Pt states that she only provides herself with one 100ml bag, even when "feeling her best." Pt supplements her TF regimen with a   PO diet, but has a very limited list of foods that she feels comfortable eating. These foods are similar to what she puts in her TF, but also include dried beans (not canned), homemade cassava root chips, and brussels sprouts. Pt can also eat rice crisps, rice pasta, and steamed cabbage. Of note, pt has had difficulty tolerating much PO intake/TF for >1 week, so pt is at risk for refeeding  syndrome as TF/PO intake resumes/improves.   Pt reports significant changes in her weight over the last year. Pt reports weighing 76 lbs in November 2020, then weighing 100 lbs in April 2021. Pt states she then lost weight again and recalls weighing 74 lbs in June 2021 (indicating a 26% wt loss x2 months, which is significant and severe for time frame). Pt has since been able to regain some of her weight, but has not yet returned to her baseline of 110-115 lbs. Pt currently weighs 97 lbs. Unable to verify weight history as there are no previous weight encounters available in pt's chart. Although pt has been able to regain some of the weight she lost, pt still meets criteria for severe malnutrition given her severe wt loss, meeting <75% of her estimated needs for >1 month, and her muscle/fat depletions as detailed below.   Pt's current home TF regimen and PO intake are insufficient to meet the patient's calorie and protein needs, and it is very unlikely that the pt will be able to meet her needs via TF and PO intake given her many dietary restrictions. Recommend pt have TPN initiated as she is severely malnourished and her home TF regimen and PO intake are insufficient to meet her needs for maintenance and repletion. This would be best initiated in the hospital as pt is at very high risk of refeeding syndrome and needs close monitoring with continued TPN after discharge. RD made recommendation to MD; however, MD does not think that pt is appropriate for TPN at this time.  Given pt has not been meeting her nutritional needs for an extended amount of time, will check the following vitamins/minerals to look for any micronutrient deficiencies: Thiamine B1, Vitamin B12, Folate B9, Vitamin A, Vitamin D, Vitamin C, Copper, Selenium, Zinc, Iron panel, B6.  Pt has reported difficulty swallowing and delayed gastric emptying with nausea. Given pt has been refusing all TF, SLP assessed pt and approved a Dysphagia 3 diet  with thin liquids; however, after discussions with the Patient Services Manager, decision was made to liberalize the pt to a regular diet to allow for more food options to be ordered for pt given her numerous food allergies. Dr. Reesa Chew agreed with plan. RD and Patient Risk analyst met with pt this morning to review her food allergies/intolerances in more detail and to develop plan to provide her with meals/snacks she feels comfortable with that can also be managed by food and nutrition staff. Unfortunately, there are very few options given pt's many restrictions that she specified. After discussions with MD, Patient Services Manager, the [facility], and the patient, the plan was made to provide pt with a facility-approved refrigerator/freezer in her room to store her home TF bags. Pt was informed that by agreeing to this plan, obtaining the TF and storing the TF in her room would be her sole responsibility as our staff cannot create the blenderized formula she is agreeable to using. Pt agreeable to this plan. The Head Chef is also providing the pt with a few shelf-stable items that the pt  eats on a regular basis to keep in her room. Nursing is to assist pt with heating food as necessary.   It should be noted that pt was under the impression a consult for a Registered Dietitian had been placed over the weekend; however, no consult was placed until 05/18/20 at 1011.   UOP: 255m documented x24 hours   Labs: K+ 3.3 (L), CBGs 89-90-122 Medications: Pepcid, ss novolog Q4H, deltasone, Vitamin B12, Vitamin D   NUTRITION - FOCUSED PHYSICAL EXAM:    Most Recent Value  Orbital Region No depletion  Upper Arm Region Mild depletion  Thoracic and Lumbar Region Moderate depletion  Buccal Region Mild depletion  Temple Region Mild depletion  Clavicle Bone Region Mild depletion  Clavicle and Acromion Bone Region Moderate depletion  Scapular Bone Region Moderate depletion  Dorsal Hand Mild depletion  Patellar  Region Severe depletion  Anterior Thigh Region Severe depletion  Posterior Calf Region Severe depletion  Edema (RD Assessment) None  Hair Reviewed  Eyes Reviewed  Mouth Reviewed  Skin Reviewed  Nails Reviewed       Diet Order:   Diet Order            Diet regular Room service appropriate? Yes; Fluid consistency: Thin  Diet effective now                 EDUCATION NEEDS:   Education needs have been addressed  Skin:  Skin Assessment: Reviewed RN Assessment  Last BM:  11/28  Height:   Ht Readings from Last 1 Encounters:  05/16/20 5' 4" (1.626 m)    Weight:   Wt Readings from Last 1 Encounters:  05/19/20 44.2 kg   BMI:  Body mass index is 16.73 kg/m.  Estimated Nutritional Needs:   Kcal:  [address] Protein:  90-105 grams  Fluid:  >1.75L/d    ALarkin Ina MS, RD, LDN RD pager number and weekend/on-call pager number located in [phone]

## 2020-05-19 NOTE — Progress Notes (Signed)
Pt refused 0800 am treatments.  Pt receiving plasmapheresis and stated that too much was going on.  Tried to give patient 1400 treatments pt began treatment and asked to stop because she was face timimg son.

## 2020-05-19 NOTE — Progress Notes (Signed)
PROGRESS NOTE    Brandy [name]  ZHG:[id number] DOB: December 08, 1990 DOA: 05/16/2020 PCP: Patient, No Pcp Per   Brief Narrative:  29 year old with history of myasthenia gravis, Ehlers-Danlos with cervical instability, dysautonomia, wasting syndrome, malabsorption, muscle activation disorder, POTS, GERD, IBS, partial seizure, asthma, anxiety presented with 1 month of progressive weakness and shortness of breath.  Initially PCCM was consulted as there was concerns about ability to maintain and protect her airway.  Neurology was consulted and she was started on Plex treatment every other day X5 doses.   Assessment & Plan:   Active Problems:   Myasthenia exacerbation (HCC)   Acute exacerbation of myasthenia gravis -Currently patient is getting plasmapheresis per neurology recommendations. Will be getting 5 total doses every other day. Has a left chest wall dialysis catheter. This can be completed outpatient per Dr Elon Spanner if it needs to be with their team will work on making arrangements today. -Continue Mestinon -Steroids per neurology team -Daily NIF/VC  Severe protein calorie malnutrition/cachexia with BMI of 18 -Has PEG tube but refusing tube feeds. Dietitian involved and discussed case with IR who is helping arrange for her meals. Due to her dietary restrictions they are concerned about her protein intake and requesting if we can start TPN. I would really want to avoid starting TPN while she has a port in place (not compatible for TPN), left chest wall dialysis catheter and then adding another PICC line would tremendously increased risk for infection. TPN should be postponed until after she is done with plasmapheresis treatment.  Mast cell disorder -Daily prednisone, zafirlukast, Xyzal    DVT prophylaxis: Lovenox Code Status: Full code Family Communication:    Status is: Inpatient  Remains inpatient appropriate because:Inpatient level of care appropriate due to severity of  illness   Dispo: The patient is from: Home              Anticipated d/c is to: Home              Anticipated d/c date is: > 3 days              Patient currently is not medically stable to d/c.  Requires inpatient plasmapheresis treatment, total of 5 treatments every other day.   Body mass index is 16.73 kg/m.    Subjective: Patient seen and examined in her room this morning. Patient has been quite demanding about lots of things in the hospital regarding her medications and her diet. I explained her that a pharmacist and the dietitian team will be working with her to best accommodate her needs. Going forward we will also continue working with her as best as we can in the hospital but to an extent she would like to make some compromise with her home routine. She is agreeable. During my interaction patient's RN, dietitian and HD/plasmapheresis nurse in the room with me.   Examination: Constitutional: Cachexia bilateral temporal wasting. C-collar in place Respiratory: Clear to auscultation bilaterally Cardiovascular: Normal sinus rhythm, no rubs Abdomen: Nontender nondistended good bowel sounds Musculoskeletal: No edema noted Skin: No rashes seen Neurologic: CN 2-12 grossly intact.  And nonfocal Psychiatric: Normal judgment and insight. Alert and oriented x 3. Normal mood.  J-tube and G-tube in place External female catheter Dialysis catheter left chest wall Right chest wall port  Objective: Vitals:   05/19/20 1001 05/19/20 1021 05/19/20 1027 05/19/20 1034  BP: (!) 140/100 125/87 (P) 111/85 127/81  Pulse: 73 69 (P) 65 64  Resp: (!) 26 (!) 22 (P)  19 19  Temp:      TempSrc:      SpO2: 100% 100% (P) 100% 100%  Weight:      Height:        Intake/Output Summary (Last 24 hours) at 05/19/2020 1047 Last data filed at 05/19/2020 0800 Gross per 24 hour  Intake 237.58 ml  Output 250 ml  Net -12.42 ml   Filed Weights   05/16/20 1026 05/16/20 1640 05/19/20 0346  Weight: 49.9 kg  49.9 kg 44.2 kg     Data Reviewed:   CBC: Recent Labs  Lab 05/16/20 0733 05/17/20 0551 05/18/20 0457 05/19/20 0717  WBC 5.2 9.5 7.5 8.9  NEUTROABS 3.0  --   --   --   HGB 9.5* 11.3* 11.2* 11.0*  HCT 30.4* 35.5* 35.0* 34.1*  MCV 88.9 87.7 86.0 86.1  PLT 168 177 175 174   Basic Metabolic Panel: Recent Labs  Lab 05/16/20 0733 05/16/20 1502 05/17/20 0551 05/18/20 0457 05/19/20 0717  NA 143  --  138 139 135  K 3.4*  --  3.2* 3.0* 3.3*  CL 107  --  105 104 102  CO2 27  --  23 26 25   GLUCOSE 97  --  70 102* 94  BUN 10  --  8 5* <5*  CREATININE 0.60 0.63 0.79 0.75 0.75  CALCIUM 8.5*  --  8.5* 8.6* 8.4*  MG  --  2.1 1.9 2.0 2.1  PHOS  --  4.3 4.1 4.6 3.8   GFR: Estimated Creatinine Clearance: 73.1 mL/min (by C-G formula based on SCr of 0.75 mg/dL). Liver Function Tests: Recent Labs  Lab 05/16/20 1502 05/19/20 0717  AST 18 14*  ALT 18 14  ALKPHOS 57 34*  BILITOT 1.8* 2.1*  PROT 5.9* 5.3*  ALBUMIN 3.9 4.0   No results for input(s): LIPASE, AMYLASE in the last 168 hours. No results for input(s): AMMONIA in the last 168 hours. Coagulation Profile: No results for input(s): INR, PROTIME in the last 168 hours. Cardiac Enzymes: No results for input(s): CKTOTAL, CKMB, CKMBINDEX, TROPONINI in the last 168 hours. BNP (last 3 results) No results for input(s): PROBNP in the last 8760 hours. HbA1C: Recent Labs    05/16/20 1502  HGBA1C 4.7*   CBG: Recent Labs  Lab 05/18/20 2320 05/19/20 0141 05/19/20 0427 05/19/20 0615 05/19/20 0725  GLUCAP 103* 131* 105* 89 90   Lipid Profile: No results for input(s): CHOL, HDL, LDLCALC, TRIG, CHOLHDL, LDLDIRECT in the last 72 hours. Thyroid Function Tests: No results for input(s): TSH, T4TOTAL, FREET4, T3FREE, THYROIDAB in the last 72 hours. Anemia Panel: No results for input(s): VITAMINB12, FOLATE, FERRITIN, TIBC, IRON, RETICCTPCT in the last 72 hours. Sepsis Labs: No results for input(s): PROCALCITON, LATICACIDVEN in  the last 168 hours.  Recent Results (from the past 240 hour(s))  Resp Panel by RT-PCR (Flu A&B, Covid) Nasopharyngeal Swab     Status: None   Collection Time: 05/16/20  8:15 AM   Specimen: Nasopharyngeal Swab; Nasopharyngeal(NP) swabs in vial transport medium  Result Value Ref Range Status   SARS Coronavirus 2 by RT PCR NEGATIVE NEGATIVE Final    Comment: (NOTE) SARS-CoV-2 target nucleic acids are NOT DETECTED.  The SARS-CoV-2 RNA is generally detectable in upper respiratory specimens during the acute phase of infection. The lowest concentration of SARS-CoV-2 viral copies this assay can detect is 138 copies/mL. A negative result does not preclude SARS-Cov-2 infection and should not be used as the sole basis for treatment or other  patient management decisions. A negative result may occur with  improper specimen collection/handling, submission of specimen other than nasopharyngeal swab, presence of viral mutation(s) within the areas targeted by this assay, and inadequate number of viral copies(<138 copies/mL). A negative result must be combined with clinical observations, patient history, and epidemiological information. The expected result is Negative.  Fact Sheet for Patients:  BloggerCourse.com  Fact Sheet for Healthcare Providers:  SeriousBroker.it  This test is no t yet approved or cleared by the [place] FDA and  has been authorized for detection and/or diagnosis of SARS-CoV-2 by FDA under an Emergency Use Authorization (EUA). This EUA will remain  in effect (meaning this test can be used) for the duration of the COVID-19 declaration under Section 564(b)(1) of the Act, 21 U.S.C.section 360bbb-3(b)(1), unless the authorization is terminated  or revoked sooner.       Influenza A by PCR NEGATIVE NEGATIVE Final   Influenza B by PCR NEGATIVE NEGATIVE Final    Comment: (NOTE) The Xpert Xpress SARS-CoV-2/FLU/RSV plus assay  is intended as an aid in the diagnosis of influenza from Nasopharyngeal swab specimens and should not be used as a sole basis for treatment. Nasal washings and aspirates are unacceptable for Xpert Xpress SARS-CoV-2/FLU/RSV testing.  Fact Sheet for Patients: BloggerCourse.com  Fact Sheet for Healthcare Providers: SeriousBroker.it  This test is not yet approved or cleared by the [place] FDA and has been authorized for detection and/or diagnosis of SARS-CoV-2 by FDA under an Emergency Use Authorization (EUA). This EUA will remain in effect (meaning this test can be used) for the duration of the COVID-19 declaration under Section 564(b)(1) of the Act, 21 U.S.C. section 360bbb-3(b)(1), unless the authorization is terminated or revoked.  Performed at [facility], [address] [place], [place] [address]   MRSA PCR Screening     Status: None   Collection Time: 05/16/20  3:02 PM   Specimen: Nasal Mucosa; Nasopharyngeal  Result Value Ref Range Status   MRSA by PCR NEGATIVE NEGATIVE Final    Comment:        The GeneXpert MRSA Assay (FDA approved for NASAL specimens only), is one component of a comprehensive MRSA colonization surveillance program. It is not intended to diagnose MRSA infection nor to guide or monitor treatment for MRSA infections. Performed at [facility], [address] [place], [place] [address]          Radiology Studies: No results found.      Scheduled Meds: . baclofen  10 mg Oral QID  . budesonide (PULMICORT) nebulizer solution  0.5 mg Nebulization BID  . cromolyn  200 mg Oral 5 times per day  . cromolyn  20 mg Nebulization TID  . enoxaparin (LOVENOX) injection  30 mg Subcutaneous Q24H  . famotidine  40 mg Oral Q12H  . feeding supplement (GLUCERNA 1.2 CAL)  1,000 mL Per Tube Q24H  . Fluticasone-Salmeterol  1 puff Inhalation BID  . insulin aspart  0-6 Units Subcutaneous Q4H   . ipratropium-albuterol  3 mL Nebulization BID  . ivabradine  5 mg Oral BID WC  . levocetirizine  5 mg Oral Q12H  . predniSONE  25 mg Oral Q breakfast  . pyridostigmine  90 mg Oral Q3H  . vitamin B-12  1,000 mcg Oral Daily  . [START ON 05/20/2020] Vitamin D (Ergocalciferol)  50,000 Units Oral Once per day on Sun Wed  . zafirlukast  20 mg Oral BID AC   Continuous Infusions: . sodium  chloride 75 mL/hr at 05/19/20 0318  . calcium gluconate 2,000 mg (05/19/20 0900)  . citrate dextrose    . potassium chloride       LOS: 3 days   Time spent= 35 mins    Hilda Wexler Joline Maxcyhirag Fendi Meinhardt, MD [facility]  If 7PM-7AM, please contact night-coverage  05/19/2020, 10:47 AM

## 2020-05-19 NOTE — TOC Initial Note (Signed)
Transition of Care [facility]) - Initial/Assessment Note    Patient Details  Name: Brandy [name] MRN: [id number] Date of Birth: 1991-05-07  Transition of [facility] CM/SW Contact:    Pollie Friar, RN Phone Number: 05/19/2020, 3:54 PM  Clinical Narrative:                 Pt admitted with myasthenia exacerbation. She has recently moved from [place] to [place]. Pt states she has contacted DSS and her medicaid should be transferred to [facility] 12/1. Pt is also trying to get enrolled in CAPS through [place] but there is a waiting list. Pt requested CM send in for aides through her medicaid until CAPS starts. CM has started the forms but is unable to complete or fax in until her medicaid changes to . Pt is active with [employer] from [place] for home IVF. She does her own care of the site, they just provide the products needed. Pt is receiving plasmapheresis and would like to try and have it done outpatient. Neurology is working on this. CM met with her to plan transportation. She states her [facility] usually pays for the transport. CM called the transportation line but pts information is still from [place] so they can not arrange until insurance becomes active in [place]. Pt uses home oxygen at night through [employer]. CM called and [employer] has taken over the company. CM called [employer] and informed them of her oxygen usage.  Pt is requesting a trauma therapist at d/c to provide needed medications and therapy. CM will see what can be arranged.  Pt requesting [place] PT/OT/ST/SW once she discharges home. CM following and will get arranged.  PCP: to be arranged. TOC following.   Expected Discharge Plan: [place] Barriers to Discharge: Continued Medical Work up   Patient Goals and CMS Choice   CMS Medicare.gov Compare Post Acute Care list provided to:: Patient Choice offered to / list presented to : Patient  Expected Discharge Plan and Services Expected Discharge Plan: [place]    Discharge Planning Services: CM Consult Post Acute Care Choice: [place] arrangements for the past 2 months: Apartment                           HH Arranged: PT, OT, Speech Therapy, Social Work          Prior Living Arrangements/Services Living arrangements for the past 2 months: Apartment Lives with:: Minor Children Patient language and need for interpreter reviewed:: Yes Do you feel safe going back to the place where you live?: Yes      Need for Family Participation in Patient Care: Yes (Comment) Care giver support system in place?: No (comment) Current home services: DME (all needed) Criminal Activity/Legal Involvement Pertinent to Current Situation/Hospitalization: No - Comment as needed  Activities of Daily Living      Permission Sought/Granted                  Emotional Assessment Appearance:: Appears stated age Attitude/Demeanor/Rapport: Engaged Affect (typically observed): Accepting Orientation: : Oriented to Self, Oriented to Place, Oriented to  Time, Oriented to Situation   Psych Involvement: No (comment)  Admission diagnosis:  Myasthenia exacerbation ([place]) [G70.01] Patient Active Problem List   Diagnosis Date Noted  . Myasthenia exacerbation ([place]) 05/16/2020   PCP:  Patient, No Pcp Per Pharmacy:   [id number] [place] [place] [address] Phone: [phone] Fax: [phone]     Social Determinants of Health (SDOH) Interventions    Readmission Risk Interventions No flowsheet data found.

## 2020-05-20 ENCOUNTER — Inpatient Hospital Stay (HOSPITAL_COMMUNITY): Payer: 59

## 2020-05-20 DIAGNOSIS — R1084 Generalized abdominal pain: Secondary | ICD-10-CM

## 2020-05-20 DIAGNOSIS — G7001 Myasthenia gravis with (acute) exacerbation: Secondary | ICD-10-CM | POA: Diagnosis not present

## 2020-05-20 DIAGNOSIS — E43 Unspecified severe protein-calorie malnutrition: Secondary | ICD-10-CM | POA: Diagnosis not present

## 2020-05-20 DIAGNOSIS — E46 Unspecified protein-calorie malnutrition: Secondary | ICD-10-CM | POA: Insufficient documentation

## 2020-05-20 HISTORY — DX: Unspecified severe protein-calorie malnutrition: E43

## 2020-05-20 LAB — PHOSPHORUS: Phosphorus: 3.8 mg/dL (ref 2.5–4.6)

## 2020-05-20 LAB — CBC
HCT: 33.6 % — ABNORMAL LOW (ref 36.0–46.0)
Hemoglobin: 10.8 g/dL — ABNORMAL LOW (ref 12.0–15.0)
MCH: 28 pg (ref 26.0–34.0)
MCHC: 32.1 g/dL (ref 30.0–36.0)
MCV: 87 fL (ref 80.0–100.0)
Platelets: 162 10*3/uL (ref 150–400)
RBC: 3.86 MIL/uL — ABNORMAL LOW (ref 3.87–5.11)
RDW: 16.1 % — ABNORMAL HIGH (ref 11.5–15.5)
WBC: 6.4 10*3/uL (ref 4.0–10.5)
nRBC: 0 % (ref 0.0–0.2)

## 2020-05-20 LAB — COMPREHENSIVE METABOLIC PANEL
ALT: 9 U/L (ref 0–44)
AST: 10 U/L — ABNORMAL LOW (ref 15–41)
Albumin: 4.1 g/dL (ref 3.5–5.0)
Alkaline Phosphatase: 18 U/L — ABNORMAL LOW (ref 38–126)
Anion gap: 10 (ref 5–15)
BUN: <5 mg/dL — ABNORMAL LOW (ref 6–20)
CO2: 24 mmol/L (ref 22–32)
Calcium: 8.7 mg/dL — ABNORMAL LOW (ref 8.9–10.3)
Chloride: 106 mmol/L (ref 98–111)
Creatinine, Ser: 0.66 mg/dL (ref 0.44–1.00)
GFR, Estimated: >60 mL/min (ref >60)
Glucose, Bld: 91 mg/dL (ref 70–99)
Potassium: 3.3 mmol/L — ABNORMAL LOW (ref 3.5–5.1)
Sodium: 140 mmol/L (ref 135–145)
Total Bilirubin: 1.9 mg/dL — ABNORMAL HIGH (ref 0.3–1.2)
Total Protein: 4.8 g/dL — ABNORMAL LOW (ref 6.5–8.1)

## 2020-05-20 LAB — GLUCOSE, CAPILLARY
Glucose-Capillary: 103 mg/dL — ABNORMAL HIGH (ref 70–99)
Glucose-Capillary: 113 mg/dL — ABNORMAL HIGH (ref 70–99)
Glucose-Capillary: 121 mg/dL — ABNORMAL HIGH (ref 70–99)
Glucose-Capillary: 80 mg/dL (ref 70–99)
Glucose-Capillary: 91 mg/dL (ref 70–99)
Glucose-Capillary: 92 mg/dL (ref 70–99)

## 2020-05-20 LAB — MISC LABCORP TEST (SEND OUT): Labcorp test code: 716910

## 2020-05-20 LAB — MAGNESIUM: Magnesium: 1.9 mg/dL (ref 1.7–2.4)

## 2020-05-20 MED ORDER — VITAMIN D (ERGOCALCIFEROL) 1.25 MG (50000 UNIT) PO CAPS
50000.0000 [IU] | ORAL_CAPSULE | ORAL | Status: DC
  Filled 2020-05-20: qty 1

## 2020-05-20 MED ORDER — IOHEXOL 9 MG/ML PO SOLN
ORAL | Status: AC
  Filled 2020-05-20: qty 1000

## 2020-05-20 MED ORDER — LORAZEPAM 2 MG/ML IJ SOLN
1.0000 mg | Freq: Once | INTRAMUSCULAR | Status: AC
  Administered 2020-05-21: 1 mg via INTRAVENOUS
  Filled 2020-05-20: qty 1

## 2020-05-20 MED ORDER — BARIUM SULFATE 0.1 % PO SUSP: 450.0000 mL | Freq: Once | ORAL | Status: DC

## 2020-05-20 NOTE — Consult Note (Signed)
Reason for Consult:pain at gj tube Referring Physician: Dr Frederick Peers  Brandy [name] is an 29 y.o. female.  HPI: 5 yof with ED and MG and multiple other issues admitted for sob and mg flare. She has recently moved from [place]. She is ready for dc today per TRH but she has a gj tube in that is causing pain.  This orignially was placed several months ago in [place] as  PEG tube and then changed to GJ by her GI physician there. She can swallow but does have issues.  She has been flushing tube and has occasionally used it as well for ntn. Complaint is pain lateral to this site as well as the g port back up. I was asked to see her   Past Medical History:  Diagnosis Date  . Myasthenia gravis (HCC)     History reviewed. No pertinent surgical history.  No family history on file.  Social History:  has no history on file for tobacco use, alcohol use, and drug use.  Allergies:  Allergies  Allergen Reactions  . Aspirin   . Nsaids   . Quinolones   . Sulfa Antibiotics   . Telithromycin   . Fludrocortisone   . Ambien [Zolpidem]   . Beef-Potatoes-Spinach [Compleat]   . Botulinum Toxins   . Chlorhexidine Hives  . Codeine   . Creon [Pancrelipase (Lip-Prot-Amyl)]   . Cymbalta [Duloxetine Hcl]   . Dextrans   . Ergotamine   . Gabapentin   . Gallamine   . Hydrocodone   . Lactose Intolerance (Gi)   . Lunesta [Eszopiclone]   . Macrolides And Ketolides   . Maprotiline   . Marplan [Isocarboxazid]     Any MAOI contraindicated  . Midodrine   . Morphine And Related   . Nitrous Oxide   . Oxycodone   . Pethidine [Meperidine]   . Phenergan [Promethazine]   . Phenobarbital   . Pyridium [Phenazopyridine]   . Reglan [Metoclopramide]   . Remeron [Mirtazapine]     Noradrenergic antagonist  . Salicylates   . Savella [Milnacipran]   . Scopolamine   . Sumatriptan   . Tape   . Topiramate   . Tricyclic Antidepressants   . Tums [Calcium Carbonate]     Patient reports allergic.  RN to find out  adverse reaction and update allergy field.   . Vancomycin     Medications: I have reviewed the patient's current medications.  Results for orders placed or performed during the hospital encounter of 05/16/20 (from the past 48 hour(s))  Glucose, capillary     Status: Abnormal   Collection Time: 05/18/20  4:39 PM  Result Value Ref Range   Glucose-Capillary 154 (H) 70 - 99 mg/dL    Comment: Glucose reference range applies only to samples taken after fasting for at least 8 hours.   Comment 1 Notify RN    Comment 2 Document in Chart   Glucose, capillary     Status: Abnormal   Collection Time: 05/18/20  9:38 PM  Result Value Ref Range   Glucose-Capillary 127 (H) 70 - 99 mg/dL    Comment: Glucose reference range applies only to samples taken after fasting for at least 8 hours.   Comment 1 Notify RN    Comment 2 Document in Chart   Glucose, capillary     Status: Abnormal   Collection Time: 05/18/20 11:20 PM  Result Value Ref Range   Glucose-Capillary 103 (H) 70 - 99 mg/dL    Comment: Glucose reference  range applies only to samples taken after fasting for at least 8 hours.   Comment 1 Notify RN    Comment 2 Document in Chart   Glucose, capillary     Status: Abnormal   Collection Time: 05/19/20  1:41 AM  Result Value Ref Range   Glucose-Capillary 131 (H) 70 - 99 mg/dL    Comment: Glucose reference range applies only to samples taken after fasting for at least 8 hours.   Comment 1 Notify RN    Comment 2 Document in Chart   Glucose, capillary     Status: Abnormal   Collection Time: 05/19/20  4:27 AM  Result Value Ref Range   Glucose-Capillary 105 (H) 70 - 99 mg/dL    Comment: Glucose reference range applies only to samples taken after fasting for at least 8 hours.   Comment 1 Notify RN    Comment 2 Document in Chart   Glucose, capillary     Status: None   Collection Time: 05/19/20  6:15 AM  Result Value Ref Range   Glucose-Capillary 89 70 - 99 mg/dL    Comment: Glucose reference  range applies only to samples taken after fasting for at least 8 hours.   Comment 1 Notify RN    Comment 2 Document in Chart   CBC     Status: Abnormal   Collection Time: 05/19/20  7:17 AM  Result Value Ref Range   WBC 8.9 4.0 - 10.5 K/uL   RBC 3.96 3.87 - 5.11 MIL/uL   Hemoglobin 11.0 (L) 12.0 - 15.0 g/dL   HCT 29.534.1 (L) 36 - 46 %   MCV 86.1 80.0 - 100.0 fL   MCH 27.8 26.0 - 34.0 pg   MCHC 32.3 30.0 - 36.0 g/dL   RDW 62.115.9 (H) 30.811.5 - 65.715.5 %   Platelets 174 150 - 400 K/uL   nRBC 0.0 0.0 - 0.2 %    Comment: Performed at [facility], [address] [place], [place] [address]  Magnesium     Status: None   Collection Time: 05/19/20  7:17 AM  Result Value Ref Range   Magnesium 2.1 1.7 - 2.4 mg/dL    Comment: Performed at [facility], [address] [place], [place] [address]  Phosphorus     Status: None   Collection Time: 05/19/20  7:17 AM  Result Value Ref Range   Phosphorus 3.8 2.5 - 4.6 mg/dL    Comment: Performed at [facility], [address] [place], [place] [address]  Comprehensive metabolic panel     Status: Abnormal   Collection Time: 05/19/20  7:17 AM  Result Value Ref Range   Sodium 135 135 - 145 mmol/L   Potassium 3.3 (L) 3.5 - 5.1 mmol/L   Chloride 102 98 - 111 mmol/L   CO2 25 22 - 32 mmol/L   Glucose, Bld 94 70 - 99 mg/dL    Comment: Glucose reference range applies only to samples taken after fasting for at least 8 hours.   BUN <5 (L) 6 - 20 mg/dL   Creatinine, Ser 4.010.75 0.44 - 1.00 mg/dL   Calcium 8.4 (L) 8.9 - 10.3 mg/dL   Total Protein 5.3 (L) 6.5 - 8.1 g/dL   Albumin 4.0 3.5 - 5.0 g/dL   AST 14 (L) 15 - 41 U/L   ALT 14 0 - 44 U/L   Alkaline Phosphatase 34 (L) 38 - 126 U/L   Total Bilirubin 2.1 (H) 0.3 -  1.2 mg/dL   GFR, Estimated >03 >55 mL/min    Comment: (NOTE) Calculated using the CKD-EPI Creatinine Equation (2021)    Anion gap 8 5 - 15    Comment: Performed at [facility], [address] [place], [place] [address]   Glucose, capillary     Status: None   Collection Time: 05/19/20  7:25 AM  Result Value Ref Range   Glucose-Capillary 90 70 - 99 mg/dL    Comment: Glucose reference range applies only to samples taken after fasting for at least 8 hours.  Glucose, capillary     Status: Abnormal   Collection Time: 05/19/20 12:51 PM  Result Value Ref Range   Glucose-Capillary 122 (H) 70 - 99 mg/dL    Comment: Glucose reference range applies only to samples taken after fasting for at least 8 hours.   Comment 1 Notify RN    Comment 2 Document in Chart   VITAMIN D 25 Hydroxy (Vit-D Deficiency, Fractures)     Status: Abnormal   Collection Time: 05/19/20  2:00 PM  Result Value Ref Range   Vit D, 25-Hydroxy 22.45 (L) 30 - 100 ng/mL    Comment: (NOTE) Vitamin D deficiency has been defined by the Institute of Medicine  and an Endocrine Society practice guideline as a level of serum 25-OH  vitamin D less than 20 ng/mL (1,2). The Endocrine Society went on to  further define vitamin D insufficiency as a level between 21 and 29  ng/mL (2).  1. IOM (Institute of Medicine). 2010. Dietary reference intakes for  calcium and D. [place]: [employer]. 2. Holick MF, Binkley Matlock, Bischoff-Ferrari HA, et al. Evaluation,  treatment, and prevention of vitamin D deficiency: an Endocrine  Society clinical practice guideline, JCEM. 2011 Jul; 96(7): 1911-30.  Performed at [facility], [address] [place], [place] [address]   Vitamin B12     Status: None   Collection Time: 05/19/20  2:00 PM  Result Value Ref Range   Vitamin B-12 445 180 - 914 pg/mL    Comment: (NOTE) This assay is not validated for testing neonatal or myeloproliferative syndrome specimens for Vitamin B12 levels. Performed at [facility], [address] [place], [place] [address]   Folate     Status: None   Collection Time: 05/19/20  2:00 PM  Result Value Ref Range   Folate 11.4 >5.9 ng/mL    Comment: Performed  at [facility], [address], [place], [place] [address]  Iron and TIBC     Status: None   Collection Time: 05/19/20  2:00 PM  Result Value Ref Range   Iron 33 28 - 170 ug/dL   TIBC NOT CALCULATED 482 - 450 ug/dL    Comment: TRANSFERRIN <70   Saturation Ratios NOT CALCULATED 10.4 - 31.8 %    Comment: TRANSFERRIN <70   UIBC NOT CALCULATED ug/dL    Comment: TRANSFERRIN <70 Performed at [facility], [address], [place], [place] [address]   Ferritin     Status: Abnormal   Collection Time: 05/19/20  2:00 PM  Result Value Ref Range   Ferritin 6 (L) 11 - 307 ng/mL    Comment: Performed at [facility], [address] [place], [place] [address]  Reticulocytes     Status: None   Collection Time: 05/19/20  2:00 PM  Result Value Ref Range   Retic Ct Pct 1.4 0.4 - 3.1 %  RBC. 4.17 3.87 - 5.11 MIL/uL   Retic Count, Absolute 59.2 19.0 - 186.0 K/uL   Immature Retic Fract 12.9 2.3 - 15.9 %    Comment: Performed at [facility], [address], [place], [place] [address]  Glucose, capillary     Status: Abnormal   Collection Time: 05/19/20  8:09 PM  Result Value Ref Range   Glucose-Capillary 109 (H) 70 - 99 mg/dL    Comment: Glucose reference range applies only to samples taken after fasting for at least 8 hours.   Comment 1 Notify RN    Comment 2 Document in Chart   Magnesium     Status: None   Collection Time: 05/19/20  8:45 PM  Result Value Ref Range   Magnesium 1.9 1.7 - 2.4 mg/dL    Comment: Performed at [facility], [address], [place], [place] [address]  Phosphorus     Status: None   Collection Time: 05/19/20  8:45 PM  Result Value Ref Range   Phosphorus 3.0 2.5 - 4.6 mg/dL    Comment: Performed at [facility], [address] [place], [place] [address]  Glucose, capillary     Status: Abnormal   Collection Time: 05/19/20 11:40 PM  Result Value Ref Range   Glucose-Capillary 122 (H) 70 - 99 mg/dL    Comment: Glucose reference range  applies only to samples taken after fasting for at least 8 hours.   Comment 1 Notify RN    Comment 2 Document in Chart   Comprehensive metabolic panel     Status: Abnormal   Collection Time: 05/20/20  4:52 AM  Result Value Ref Range   Sodium 140 135 - 145 mmol/L   Potassium 3.3 (L) 3.5 - 5.1 mmol/L   Chloride 106 98 - 111 mmol/L   CO2 24 22 - 32 mmol/L   Glucose, Bld 91 70 - 99 mg/dL    Comment: Glucose reference range applies only to samples taken after fasting for at least 8 hours.   BUN <5 (L) 6 - 20 mg/dL   Creatinine, Ser 9.14 0.44 - 1.00 mg/dL   Calcium 8.7 (L) 8.9 - 10.3 mg/dL   Total Protein 4.8 (L) 6.5 - 8.1 g/dL   Albumin 4.1 3.5 - 5.0 g/dL   AST 10 (L) 15 - 41 U/L   ALT 9 0 - 44 U/L   Alkaline Phosphatase 18 (L) 38 - 126 U/L   Total Bilirubin 1.9 (H) 0.3 - 1.2 mg/dL   GFR, Estimated >78 >29 mL/min    Comment: (NOTE) Calculated using the CKD-EPI Creatinine Equation (2021)    Anion gap 10 5 - 15    Comment: Performed at [facility], [address], [place], [place] [address]  CBC     Status: Abnormal   Collection Time: 05/20/20  4:52 AM  Result Value Ref Range   WBC 6.4 4.0 - 10.5 K/uL   RBC 3.86 (L) 3.87 - 5.11 MIL/uL   Hemoglobin 10.8 (L) 12.0 - 15.0 g/dL   HCT 08.6 (L) 36 - 46 %   MCV 87.0 80.0 - 100.0 fL   MCH 28.0 26.0 - 34.0 pg   MCHC 32.1 30.0 - 36.0 g/dL   RDW 57.8 (H) 46.9 - 62.9 %   Platelets 162 150 - 400 K/uL   nRBC 0.0 0.0 - 0.2 %    Comment: Performed at [facility], [address] [place], [place] [address]  Magnesium  Status: None   Collection Time: 05/20/20  4:52 AM  Result Value Ref Range   Magnesium 1.9 1.7 - 2.4 mg/dL    Comment: Performed at [facility], [address] [place], [place] [address]  Phosphorus     Status: None   Collection Time: 05/20/20  4:52 AM  Result Value Ref Range   Phosphorus 3.8 2.5 - 4.6 mg/dL    Comment: Performed at [facility], [address], [place], [place] [address]   Glucose, capillary     Status: None   Collection Time: 05/20/20  5:06 AM  Result Value Ref Range   Glucose-Capillary 91 70 - 99 mg/dL    Comment: Glucose reference range applies only to samples taken after fasting for at least 8 hours.   Comment 1 Notify RN    Comment 2 Document in Chart   Glucose, capillary     Status: Abnormal   Collection Time: 05/20/20  8:19 AM  Result Value Ref Range   Glucose-Capillary 103 (H) 70 - 99 mg/dL    Comment: Glucose reference range applies only to samples taken after fasting for at least 8 hours.  Glucose, capillary     Status: Abnormal   Collection Time: 05/20/20 12:06 PM  Result Value Ref Range   Glucose-Capillary 121 (H) 70 - 99 mg/dL    Comment: Glucose reference range applies only to samples taken after fasting for at least 8 hours.    No results found.  Review of Systems  Gastrointestinal: Positive for abdominal pain. Negative for nausea and vomiting.   Blood pressure 124/72, pulse 68, temperature 97.7 F (36.5 C), temperature source Oral, resp. rate 18, height 5\' 4"  (1.626 m), weight 44.2 kg, SpO2 100 %. Physical Exam Abdominal:     Comments: g tube in place, soft mildly tender lateral to tube, nondistended   Neurological:     Mental Status: She is alert.     Assessment/Plan: Ab pain -will check ct scan due to gj tube pain and the issues she has had with it, I think will be fine -I think other issues primarily related to gastroparesis -if ct fine nothing more to do surgically and can be managed medically  05/20/2020, 2:06 PM

## 2020-05-20 NOTE — Progress Notes (Signed)
SLP Cancellation Note  Patient Details Name: Brandy [name] MRN: [id number] DOB: 02-Sep-1990   Cancelled treatment:       Reason Eval/Treat Not Completed: Patient declined, no reason specified (Pt refused p.o. intake stating that she has not wanted anything to eat today and that she is awaiting determination on whether she will have a CT. SLP will follow up on subsequent date.)  Brandy [name] I. Vear Clock, MS, CCC-SLP Acute Rehabilitation Services Office number [phone] Pager [phone]  Brandy [name] 05/20/2020, 5:19 PM

## 2020-05-20 NOTE — Progress Notes (Signed)
Neurology Progress Note   S:// Seen and examined the patient's morning. She complained of abdominal pain that is been off and on since yesterday which has been worsened over the past few hours.  She is requested evaluation for it and was told by the overnight nursing staff that they have informed the primary team but she has still not seen anyone for addressing that concern.  She was told to hold off on her eating as this might be something related to her diet. She was in tears, saying that she has not been helped and ignored all night by the overnight staff. She also complained of her port and HD catheter site bandages needing replacement and having not been addressed. Extremely anxious and tearful during the whole part of this examination.  O:// Current vital signs: BP (!) 122/110 (BP Location: Right Arm)   Pulse 64   Temp 98 F (36.7 C) (Oral)   Resp 18   Ht 5\' 4"  (1.626 m)   Wt 44.2 kg   LMP  (LMP Unknown)   SpO2 100%   BMI 16.73 kg/m  Vital signs in last 24 hours: Temp:  [97.6 F (36.4 C)-98 F (36.7 C)] 98 F (36.7 C) (11/30 2342) Pulse Rate:  [64-81] 64 (11/30 2342) Resp:  [13-26] 18 (11/30 2342) BP: (111-147)/(81-110) 122/110 (11/30 2342) SpO2:  [100 %] 100 % (11/30 2342) FiO2 (%):  [28 %] 28 % (11/30 2050) Could not evaluate with a full neurological examination as she is extremely anxious, crying and complaining of abdominal pain that needs to be addressed first.  Medications  Current Facility-Administered Medications:  .  0.9 %  sodium chloride infusion, , Intravenous, Continuous, Bhagat, Srishti L, MD, Last Rate: 75 mL/hr at 05/20/20 0318, New Bag at 05/20/20 0318 .  acetaminophen (TYLENOL) tablet 650 mg, 650 mg, Oral, Q4H PRN, 14/01/21, MD .  baclofen (LIORESAL) tablet 10 mg, 10 mg, Oral, QID, Amin, Ankit Chirag, MD, 10 mg at 05/20/20 0615 .  budesonide (PULMICORT) nebulizer solution 0.5 mg, 0.5 mg, Nebulization, BID, 14/01/21, MD, 0.5 mg at  05/19/20 2047 .  citrate dextrose (ACD-A anticoagulant) solution 1,000 mL, 1,000 mL, Other, Continuous, 2048, MD, 1,000 mL at 05/19/20 0815 .  cromolyn (GASTROCROM) solution 200 mcg -(patient's HOME MED), 200 mg, Oral, 5 times per day, Amin, Ankit Chirag, MD, 200 mg at 05/20/20 0757 .  cromolyn (INTAL) nebulizer solution 20 mg  -(patient's HOME MED), 20 mg, Nebulization, TID, 14/01/21, MD, 20 mg at 05/19/20 2048 .  cromolyn (INTAL) nebulizer solution 20 mg -(patient's HOME MED), 20 mg, Nebulization, BID PRN, Amin, Ankit Chirag, MD .  dextromethorphan-guaiFENesin (MUCINEX DM) 30-600 MG per 12 hr tablet 1 tablet, 1 tablet, Oral, BID PRN, Amin, Ankit Chirag, MD .  diphenhydrAMINE-zinc acetate (BENADRYL) 2-0.1 % cream 1-2 application, 1-2 application, Topical, Daily PRN, 2049, MD .  docusate sodium (COLACE) capsule 100 mg, 100 mg, Oral, BID PRN, Lorin Glass, MD .  DYE-FREE diphenhydrAMINE (BENADRYL) capsule 25 mg -- PT HOME MED, 25 mg, Oral, Q6H PRN, Lorin Glass, MD, 25 mg at 05/19/20 0151 .  enoxaparin (LOVENOX) injection 30 mg, 30 mg, Subcutaneous, Q24H, Amin, Ankit Chirag, MD .  EPINEPHrine (EPI-PEN) injection 0.3 mg, 0.3 mg, Intramuscular, PRN, Stretch, 05/21/20, MD .  famotidine (PEPCID) tablet 40 mg, 40 mg, Oral, Q12H, Amin, Ankit Chirag, MD, 40 mg at 05/20/20 0640 .  Fluticasone-Salmeterol (Wixela) 250-50 MCG/DOSE inhaler 1 puff, 1 puff, Inhalation, BID,  Janae Bridgeman, MD, 1 puff at 05/19/20 2049 .  insulin aspart (novoLOG) injection 0-6 Units, 0-6 Units, Subcutaneous, Q4H, Lorin Glass, MD .  ipratropium-albuterol (DUONEB) 0.5-2.5 (3) MG/3ML nebulizer solution 3 mL, 3 mL, Nebulization, BID, Cheri Fowler, MD, 3 mL at 05/19/20 2048 .  ipratropium-albuterol (DUONEB) 0.5-2.5 (3) MG/3ML nebulizer solution 3 mL, 3 mL, Nebulization, Q4H PRN, Amin, Ankit Chirag, MD, 3 mL at 05/19/20 1410 .  ivabradine (CORLANOR) tablet 5 mg, 5 mg, Oral, BID WC, Stretch,  Marveen Reeks, MD, 5 mg at 05/20/20 0641 .  ketotifen (ZADITOR) 0.025 % ophthalmic solution 1 drop, 1 drop, Both Eyes, Daily PRN, Lorin Glass, MD, 1 drop at 05/18/20 2203 .  levocetirizine (XYZAL) tablet 5 mg - (patient's HOME MED), 5 mg, Oral, Q12H, Cheri Fowler, MD, 5 mg at 05/20/20 0640 .  LORazepam (ATIVAN) tablet 0.5 mg, 0.5 mg, Oral, Q6H PRN, 0.5 mg at 05/19/20 0933 **OR** LORazepam (ATIVAN) tablet 0.5 mg, 0.5 mg, Per Tube, Q6H PRN, Lorin Glass, MD, 0.5 mg at 05/19/20 1848 .  ondansetron (ZOFRAN-ODT) disintegrating tablet 6 mg, 6 mg, Oral, Q6H PRN, Erick Blinks, MD, 6 mg at 05/19/20 2104 .  predniSONE (DELTASONE) tablet 25 mg, 25 mg, Oral, Q breakfast, Erick Blinks, MD, 25 mg at 05/20/20 0615 .  pyridostigmine (MESTINON) tablet 90 mg - (patient's HOME MED), 90 mg, Oral, Q3H, Amin, Ankit Chirag, MD, 90 mg at 05/20/20 0614 .  senna-docusate (Senokot-S) tablet 1 tablet, 1 tablet, Oral, QHS PRN, Amin, Ankit Chirag, MD .  sodium chloride flush (NS) 0.9 % injection 10-40 mL, 10-40 mL, Intracatheter, PRN, Cheri Fowler, MD .  Vitamin B-12 (cyanocaobalamine) tablet 1,000 mcg (home med), 1,000 mcg, Oral, Daily, Amin, Ankit Chirag, MD, 1,000 mcg at 05/19/20 1807 .  Vitamin D (Ergocalciferol) (DRISDOL) capsule 50,000 Units, 50,000 Units, Oral, Once per day on Sun Wed, Girguis, David, MD .  zafirlukast (ACCOLATE) tablet 20 mg, 20 mg, Oral, BID AC, Stretch, Marveen Reeks, MD, 20 mg at 05/20/20 0641 Labs CBC    Component Value Date/Time   WBC 6.4 05/20/2020 0452   RBC 3.86 (L) 05/20/2020 0452   HGB 10.8 (L) 05/20/2020 0452   HCT 33.6 (L) 05/20/2020 0452   PLT 162 05/20/2020 0452   MCV 87.0 05/20/2020 0452   MCH 28.0 05/20/2020 0452   MCHC 32.1 05/20/2020 0452   RDW 16.1 (H) 05/20/2020 0452   LYMPHSABS 1.3 05/16/2020 0733   MONOABS 0.8 05/16/2020 0733   EOSABS 0.1 05/16/2020 0733   BASOSABS 0.0 05/16/2020 0733    CMP     Component Value Date/Time   NA 140 05/20/2020 0452   K 3.3  (L) 05/20/2020 0452   CL 106 05/20/2020 0452   CO2 24 05/20/2020 0452   GLUCOSE 91 05/20/2020 0452   BUN <5 (L) 05/20/2020 0452   CREATININE 0.66 05/20/2020 0452   CALCIUM 8.7 (L) 05/20/2020 0452   PROT 4.8 (L) 05/20/2020 0452   ALBUMIN 4.1 05/20/2020 0452   AST 10 (L) 05/20/2020 0452   ALT 9 05/20/2020 0452   ALKPHOS 18 (L) 05/20/2020 0452   BILITOT 1.9 (H) 05/20/2020 0452   GFRNONAA >60 05/20/2020 0452     Imaging No new results brain  Assessment: 29 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 Continues to complain of multiple issues at this time: -J-tube leaking -Inability  to get tube feeds -Abdominal pain -Generalized weakness -Complains of generally not getting good care during her stay throughout the hospitalization.  Would not permit or cooperate with a full neurological examination due to current ongoing discomfort with abdominal pain etc.  Possible myasthenic exacerbation in addition to multiple other medical issues  Recommendations: From a neurological standpoint, continue current medications. She needs to complete 5 total rounds of plasma exchange.  She has already completed 2 as of yesterday. For the next 3, she can come in as an outpatient-orders are in and after the final round, IR can remove her catheter. She will follow up with outpatient neuromuscular clinic, Dr. Nita Sickle 1 to 2 weeks after her final plasma exchange. She needs establish care with allergy neurology and rheumatology as an outpatient. I have discussed in detail with her her plan of neurological care.  I have also recommended that for her ongoing care, she should be looking to establish care at the Center where multiple specialties such as allergy immunology,  rheumatology and neurology are all available in-house, which unfortunately is not the case at our facility at the current time.  I communicated my plan in detail with primary attending Dr. Frederick Peers  -- Milon Dikes, MD [facility] Pager: [phone] If 7pm to 7am, please call on call as listed on AMION.

## 2020-05-20 NOTE — Progress Notes (Signed)
SLP Cancellation Note  Patient Details Name: Brandy [name] MRN: [id number] DOB: [birth year]   Cancelled treatment:       Reason Eval/Treat Not Completed: Patient at procedure or test/unavailable (Pt was approached for treatment but was having a breathing treatment. SLP will follow up.)  Carlean Crowl I. Vear Clock, MS, CCC-SLP Acute Rehabilitation Services Office number [phone] Pager [phone]  Scheryl Marten 05/20/2020, 2:35 PM

## 2020-05-20 NOTE — Plan of Care (Signed)
Min assist with adls 

## 2020-05-20 NOTE — Progress Notes (Signed)
PROGRESS NOTE    Brandy [name]   QIW:[id number]  DOB: 05-23-1991  DOA: 05/16/2020     4  PCP: Patient, No Pcp Per  CC: weakness, SOB  Hospital Course: 29 year old with history of myasthenia gravis, Ehlers-Danlos with cervical instability (wears C-collar), dysautonomia, wasting syndrome, malabsorption, muscle activation disorder, POTS, GERD, IBS, partial seizure, asthma, anxiety presented with 1 month of progressive weakness and shortness of breath.  Initially PCCM was consulted as there was concerns about ability to maintain and protect her airway. She did not require ICU. Neurology was consulted and she was started on Plex treatment every other day X5 doses. She also had a recent G-tube converted to G-J recently.  She recently relocated from [place] and had pretty much immediately presented to the hospital with her complaints.  Interval History:  She has a lot of underlying anxiety regarding her medical care and multiple diagnoses.  Listened to her concerns regarding being in the hospital and provided empathetic listening.  She is hopeful for going home relatively soon.  Stated that we needed to finish the work-up especially regarding her abdominal pain if she did want this; she voiced wanting the work-up and understands the need for remaining in the hospital until complete.  Old records reviewed in assessment of this patient  ROS: Constitutional: negative for chills and fevers, Respiratory: negative for cough, Cardiovascular: negative for chest pain and Gastrointestinal: positive for abdominal pain  Assessment & Plan: Acute exacerbation of myasthenia gravis -Currently patient is getting plasmapheresis per neurology recommendations. Will be getting 5 total doses every other day. Has a left chest wall dialysis catheter.  - This can be completed outpatient per Dr. Wilford Corner if patient wishes; discussed with SW; unable to arrange transportation until Saturday, therefore patient will need to  remain in the hospital for treatment on 05/21/2020, then hopeful for discharge home -Continue Mestinon; dose is considered excessive per neurology.  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 Dietitian involved and discussed case with IR who is helping arrange for her meals. Due to her dietary restrictions they are concerned about her protein intake -Nutritional intake has been a chronic issue with the patient for several months now. - hold off on TPN given increased infection risk (right chest port and left CVL) and immunocompromised with PLEX and prednisone  Abdominal pain/discomfort - located around G tube. Etiology unclear. Differential includes slowed motility from MG vs GJ tube malfunction vs MSK/cramping - appreciate surgery assistance  - check CT w/o contrast to evaluate further  Mast cell disorder -Daily prednisone, zafirlukast, Xyzal  Antimicrobials: none  DVT prophylaxis: Lovenox Code Status: Full Family Communication: none present Disposition Plan: Status is: Inpatient  Remains inpatient appropriate because:Ongoing diagnostic testing needed not appropriate for outpatient work up, IV treatments appropriate due to intensity of illness or inability to take PO and Inpatient level of care appropriate due to severity of illness   Dispo: The patient is from: Home              Anticipated d/c is to: Home              Anticipated d/c date is: 1 day              Patient currently is not medically stable to d/c.  Objective: Blood pressure 130/83, pulse 73, temperature 97.6 F (  36.4 C), temperature source Oral, resp. rate 18, height 5\' 4"  (1.626 m), weight 44.2 kg, SpO2 100 %.  Examination: General appearance: alert, cooperative and no distress Head: Normocephalic, without obvious abnormality,  atraumatic  Neck: C collar in place Eyes: EOMI Lungs: clear to auscultation bilaterally  Chest: R port in place and covered (no signs of infection). L CVL in place and covered (no signs of infection) Heart: regular rate and rhythm and S1, S2 normal Abdomen: normal findings: G tube in place. No palpable abnormality around G-tube but patient tender with palpation. No R/G. BS present Extremities: no edema Skin: mobility and turgor normal Neurologic: Grossly normal  Consultants:   Neurology  Surgery  Procedures:     Data Reviewed: I have personally reviewed following labs and imaging studies Results for orders placed or performed during the hospital encounter of 05/16/20 (from the past 24 hour(s))  Glucose, capillary     Status: Abnormal   Collection Time: 05/19/20  8:09 PM  Result Value Ref Range   Glucose-Capillary 109 (H) 70 - 99 mg/dL   Comment 1 Notify RN    Comment 2 Document in Chart   Magnesium     Status: None   Collection Time: 05/19/20  8:45 PM  Result Value Ref Range   Magnesium 1.9 1.7 - 2.4 mg/dL  Phosphorus     Status: None   Collection Time: 05/19/20  8:45 PM  Result Value Ref Range   Phosphorus 3.0 2.5 - 4.6 mg/dL  Glucose, capillary     Status: Abnormal   Collection Time: 05/19/20 11:40 PM  Result Value Ref Range   Glucose-Capillary 122 (H) 70 - 99 mg/dL   Comment 1 Notify RN    Comment 2 Document in Chart   Comprehensive metabolic panel     Status: Abnormal   Collection Time: 05/20/20  4:52 AM  Result Value Ref Range   Sodium 140 135 - 145 mmol/L   Potassium 3.3 (L) 3.5 - 5.1 mmol/L   Chloride 106 98 - 111 mmol/L   CO2 24 22 - 32 mmol/L   Glucose, Bld 91 70 - 99 mg/dL   BUN <5 (L) 6 - 20 mg/dL   Creatinine, Ser 14/01/21 0.44 - 1.00 mg/dL   Calcium 8.7 (L) 8.9 - 10.3 mg/dL   Total Protein 4.8 (L) 6.5 - 8.1 g/dL   Albumin 4.1 3.5 - 5.0 g/dL   AST 10 (L) 15 - 41 U/L   ALT 9 0 - 44 U/L   Alkaline Phosphatase 18 (L) 38 - 126 U/L   Total Bilirubin 1.9  (H) 0.3 - 1.2 mg/dL   GFR, Estimated 8.09 >98 mL/min   Anion gap 10 5 - 15  CBC     Status: Abnormal   Collection Time: 05/20/20  4:52 AM  Result Value Ref Range   WBC 6.4 4.0 - 10.5 K/uL   RBC 3.86 (L) 3.87 - 5.11 MIL/uL   Hemoglobin 10.8 (L) 12.0 - 15.0 g/dL   HCT 14/01/21 (L) 36 - 46 %   MCV 87.0 80.0 - 100.0 fL   MCH 28.0 26.0 - 34.0 pg   MCHC 32.1 30.0 - 36.0 g/dL   RDW 82.5 (H) 05.3 - 97.6 %   Platelets 162 150 - 400 K/uL   nRBC 0.0 0.0 - 0.2 %  Magnesium     Status: None   Collection Time: 05/20/20  4:52 AM  Result Value Ref Range   Magnesium 1.9 1.7 - 2.4 mg/dL  Phosphorus  Status: None   Collection Time: 05/20/20  4:52 AM  Result Value Ref Range   Phosphorus 3.8 2.5 - 4.6 mg/dL  Glucose, capillary     Status: None   Collection Time: 05/20/20  5:06 AM  Result Value Ref Range   Glucose-Capillary 91 70 - 99 mg/dL   Comment 1 Notify RN    Comment 2 Document in Chart   Glucose, capillary     Status: Abnormal   Collection Time: 05/20/20  8:19 AM  Result Value Ref Range   Glucose-Capillary 103 (H) 70 - 99 mg/dL  Glucose, capillary     Status: Abnormal   Collection Time: 05/20/20 12:06 PM  Result Value Ref Range   Glucose-Capillary 121 (H) 70 - 99 mg/dL  Glucose, capillary     Status: Abnormal   Collection Time: 05/20/20  4:38 PM  Result Value Ref Range   Glucose-Capillary 113 (H) 70 - 99 mg/dL    Recent Results (from the past 240 hour(s))  Resp Panel by RT-PCR (Flu A&B, Covid) Nasopharyngeal Swab     Status: None   Collection Time: 05/16/20  8:15 AM   Specimen: Nasopharyngeal Swab; Nasopharyngeal(NP) swabs in vial transport medium  Result Value Ref Range Status   SARS Coronavirus 2 by RT PCR NEGATIVE NEGATIVE Final    Comment: (NOTE) SARS-CoV-2 target nucleic acids are NOT DETECTED.  The SARS-CoV-2 RNA is generally detectable in upper respiratory specimens during the acute phase of infection. The lowest concentration of SARS-CoV-2 viral copies this assay can  detect is 138 copies/mL. A negative result does not preclude SARS-Cov-2 infection and should not be used as the sole basis for treatment or other patient management decisions. A negative result may occur with  improper specimen collection/handling, submission of specimen other than nasopharyngeal swab, presence of viral mutation(s) within the areas targeted by this assay, and inadequate number of viral copies(<138 copies/mL). A negative result must be combined with clinical observations, patient history, and epidemiological information. The expected result is Negative.  Fact Sheet for Patients:  BloggerCourse.com  Fact Sheet for Healthcare Providers:  SeriousBroker.it  This test is no t yet approved or cleared by the [place] FDA and  has been authorized for detection and/or diagnosis of SARS-CoV-2 by FDA under an Emergency Use Authorization (EUA). This EUA will remain  in effect (meaning this test can be used) for the duration of the COVID-19 declaration under Section 564(b)(1) of the Act, 21 U.S.C.section 360bbb-3(b)(1), unless the authorization is terminated  or revoked sooner.       Influenza A by PCR NEGATIVE NEGATIVE Final   Influenza B by PCR NEGATIVE NEGATIVE Final    Comment: (NOTE) The Xpert Xpress SARS-CoV-2/FLU/RSV plus assay is intended as an aid in the diagnosis of influenza from Nasopharyngeal swab specimens and should not be used as a sole basis for treatment. Nasal washings and aspirates are unacceptable for Xpert Xpress SARS-CoV-2/FLU/RSV testing.  Fact Sheet for Patients: BloggerCourse.com  Fact Sheet for Healthcare Providers: SeriousBroker.it  This test is not yet approved or cleared by the [place] FDA and has been authorized for detection and/or diagnosis of SARS-CoV-2 by FDA under an Emergency Use Authorization (EUA). This EUA will remain in  effect (meaning this test can be used) for the duration of the COVID-19 declaration under Section 564(b)(1) of the Act, 21 U.S.C. section 360bbb-3(b)(1), unless the authorization is terminated or revoked.  Performed at [facility], [address], [place], [place] [address]   MRSA PCR  Screening     Status: None   Collection Time: 05/16/20  3:02 PM   Specimen: Nasal Mucosa; Nasopharyngeal  Result Value Ref Range Status   MRSA by PCR NEGATIVE NEGATIVE Final    Comment:        The GeneXpert MRSA Assay (FDA approved for NASAL specimens only), is one component of a comprehensive MRSA colonization surveillance program. It is not intended to diagnose MRSA infection nor to guide or monitor treatment for MRSA infections. Performed at [facility], [address] [place], [place] [address]      Radiology Studies: No results found. DG CHEST PORT 1 VIEW  Final Result    DG Chest Portable 1 View  Final Result    IR Radiologist Eval & Mgmt    (Results Pending)  CT ABDOMEN PELVIS WO CONTRAST    (Results Pending)  IR Cm Inj Any Colonic Tube W/Fluoro    (Results Pending)    Scheduled Meds:  iohexol       baclofen  10 mg Oral QID   budesonide (PULMICORT) nebulizer solution  0.5 mg Nebulization BID   cromolyn  200 mg Oral 5 times per day   cromolyn  20 mg Nebulization TID   enoxaparin (LOVENOX) injection  30 mg Subcutaneous Q24H   famotidine  40 mg Oral Q12H   Fluticasone-Salmeterol  1 puff Inhalation BID   insulin aspart  0-6 Units Subcutaneous Q4H   ipratropium-albuterol  3 mL Nebulization BID   ivabradine  5 mg Oral BID WC   levocetirizine  5 mg Oral Q12H   predniSONE  25 mg Oral Q breakfast   pyridostigmine  90 mg Oral Q3H   vitamin B-12  1,000 mcg Oral Daily   Vitamin D (Ergocalciferol)  50,000 Units Oral Once per day on Sun Wed   zafirlukast  20 mg Oral BID AC   PRN Meds: acetaminophen, cromolyn, dextromethorphan-guaiFENesin,  diphenhydrAMINE-zinc acetate, docusate sodium, diphenhydrAMINE, EPINEPHrine, ipratropium-albuterol, ketotifen, LORazepam **OR** LORazepam, ondansetron, senna-docusate, sodium chloride flush Continuous Infusions:  sodium chloride 75 mL/hr at 05/20/20 1615   citrate dextrose       LOS: 4 days  Time spent: Greater than 50% of the 35 minute visit was spent in counseling/coordination of care for the patient as laid out in the A&P.   Lewie Chamberavid Evalyn Shultis, MD [facility] 05/20/2020, 4:54 PM

## 2020-05-20 NOTE — TOC Progression Note (Signed)
Transition of Care [facility]) - Progression Note    Patient Details  Name: Cindee Mclester MRN: [id number] Date of Birth: March 08, 1991  Transition of [facility]) CM/SW Contact  Kermit Balo, RN Phone Number: 05/20/2020, 4:22 PM  Clinical Narrative:    CM has worked all day to try and arrange transportation for her to be able to d/c and have outpatient plasmapheresis.  CM found that her [facility] plan that does her transportation is [place] specific and she will need to have it changed. She states she thought it was but [facility] is not finding that.  CM was able to set up transport via [facility] that provided her transport in the past. They will transport her on Sat and Monday.  CM has faxed her DL/ other insurance info to DSS to try and assist in getting her insurance changed to [place].  Plan is for pt to d/c tomorrow after plasmapheresis with [facility] transport.  TOC following.   Expected Discharge Plan: Home w Home Health Services Barriers to Discharge: Continued Medical Work up  Expected Discharge Plan and Services Expected Discharge Plan: Home w Home Health Services   Discharge Planning Services: CM Consult Post Acute Care Choice: Home Health Living arrangements for the past 2 months: Apartment                           HH Arranged: PT, OT, Speech Therapy, Social Work           Social Determinants of Health (SDOH) Interventions    Readmission Risk Interventions No flowsheet data found.

## 2020-05-20 NOTE — Progress Notes (Signed)
Miscommunication re: CT has resulted in patient refusing CT. I was called to bedside because patient was reporting that she was going to leave AMA. Chart review reveals that patient really does need to be in the hospital for plasmapheresis tomorrow. We discussed this. She reports that she has anxiety triggered by events occurring in the hospital, she has not been able to sleep, and she has not eaten in 26 hours because she was told not to for CT. We have agreed that no CT shall be forced on her, and that it is her decision entirely. We will do a KUB tonight, patient has been advised that she can eat, and we will give an extra dose of ativan tonight to help her rest. Plan discussed with patient and she is in agreement. She reports understanding re: the importance of staying for her plasmapheresis treatment since she is unable to get there as an outpatient tomorrow.

## 2020-05-21 ENCOUNTER — Telehealth: Payer: Self-pay

## 2020-05-21 ENCOUNTER — Inpatient Hospital Stay (HOSPITAL_COMMUNITY): Payer: 59

## 2020-05-21 DIAGNOSIS — G7001 Myasthenia gravis with (acute) exacerbation: Secondary | ICD-10-CM | POA: Diagnosis not present

## 2020-05-21 LAB — COMPREHENSIVE METABOLIC PANEL
ALT: 10 U/L (ref 0–44)
AST: 11 U/L — ABNORMAL LOW (ref 15–41)
Albumin: 3.7 g/dL (ref 3.5–5.0)
Alkaline Phosphatase: 21 U/L — ABNORMAL LOW (ref 38–126)
Anion gap: 9 (ref 5–15)
BUN: 5 mg/dL — ABNORMAL LOW (ref 6–20)
CO2: 27 mmol/L (ref 22–32)
Calcium: 8.8 mg/dL — ABNORMAL LOW (ref 8.9–10.3)
Chloride: 105 mmol/L (ref 98–111)
Creatinine, Ser: 0.69 mg/dL (ref 0.44–1.00)
GFR, Estimated: >60 mL/min (ref >60)
Glucose, Bld: 110 mg/dL — ABNORMAL HIGH (ref 70–99)
Potassium: 3 mmol/L — ABNORMAL LOW (ref 3.5–5.1)
Sodium: 141 mmol/L (ref 135–145)
Total Bilirubin: 2.2 mg/dL — ABNORMAL HIGH (ref 0.3–1.2)
Total Protein: 4.5 g/dL — ABNORMAL LOW (ref 6.5–8.1)

## 2020-05-21 LAB — GLUCOSE, CAPILLARY
Glucose-Capillary: 86 mg/dL (ref 70–99)
Glucose-Capillary: 115 mg/dL — ABNORMAL HIGH (ref 70–99)
Glucose-Capillary: 113 mg/dL — ABNORMAL HIGH (ref 70–99)
Glucose-Capillary: 139 mg/dL — ABNORMAL HIGH (ref 70–99)

## 2020-05-21 LAB — BASIC METABOLIC PANEL
Anion gap: 8 (ref 5–15)
Anion gap: 9 (ref 5–15)
BUN: 5 mg/dL — ABNORMAL LOW (ref 6–20)
BUN: <5 mg/dL — ABNORMAL LOW (ref 6–20)
CO2: 24 mmol/L (ref 22–32)
CO2: 22 mmol/L (ref 22–32)
Calcium: 8.3 mg/dL — ABNORMAL LOW (ref 8.9–10.3)
Calcium: 8.7 mg/dL — ABNORMAL LOW (ref 8.9–10.3)
Chloride: 107 mmol/L (ref 98–111)
Chloride: 108 mmol/L (ref 98–111)
Creatinine, Ser: 0.72 mg/dL (ref 0.44–1.00)
Creatinine, Ser: 0.58 mg/dL (ref 0.44–1.00)
GFR, Estimated: >60 mL/min (ref >60)
GFR, Estimated: >60 mL/min (ref >60)
Glucose, Bld: 112 mg/dL — ABNORMAL HIGH (ref 70–99)
Glucose, Bld: 99 mg/dL (ref 70–99)
Potassium: 3.3 mmol/L — ABNORMAL LOW (ref 3.5–5.1)
Potassium: 3.6 mmol/L (ref 3.5–5.1)
Sodium: 139 mmol/L (ref 135–145)
Sodium: 139 mmol/L (ref 135–145)

## 2020-05-21 LAB — HEMOGLOBIN AND HEMATOCRIT, BLOOD
HCT: 35.4 % — ABNORMAL LOW (ref 36.0–46.0)
Hemoglobin: 11.1 g/dL — ABNORMAL LOW (ref 12.0–15.0)

## 2020-05-21 LAB — PHOSPHORUS: Phosphorus: 3.2 mg/dL (ref 2.5–4.6)

## 2020-05-21 LAB — CBC
HCT: 29.9 % — ABNORMAL LOW (ref 36.0–46.0)
Hemoglobin: 9.5 g/dL — ABNORMAL LOW (ref 12.0–15.0)
MCH: 27.9 pg (ref 26.0–34.0)
MCHC: 31.8 g/dL (ref 30.0–36.0)
MCV: 87.7 fL (ref 80.0–100.0)
Platelets: 146 10*3/uL — ABNORMAL LOW (ref 150–400)
RBC: 3.41 MIL/uL — ABNORMAL LOW (ref 3.87–5.11)
RDW: 16.4 % — ABNORMAL HIGH (ref 11.5–15.5)
WBC: 5.3 10*3/uL (ref 4.0–10.5)
nRBC: 0 % (ref 0.0–0.2)

## 2020-05-21 LAB — MAGNESIUM: Magnesium: 2 mg/dL (ref 1.7–2.4)

## 2020-05-21 LAB — VITAMIN C: Vitamin C: 0.6 mg/dL (ref 0.4–2.0)

## 2020-05-21 MED ORDER — HEPARIN SODIUM (PORCINE) 1000 UNIT/ML IJ SOLN
INTRAMUSCULAR | Status: AC
  Administered 2020-05-21: 1000 [IU]
  Filled 2020-05-21: qty 4

## 2020-05-21 MED ORDER — HEPARIN SOD (PORK) LOCK FLUSH 100 UNIT/ML IV SOLN
500.0000 [IU] | INTRAVENOUS | Status: DC | PRN
  Filled 2020-05-21: qty 5

## 2020-05-21 MED ORDER — DIPHENHYDRAMINE HCL 25 MG PO CAPS
25.0000 mg | ORAL_CAPSULE | Freq: Four times a day (QID) | ORAL | Status: DC | PRN
  Administered 2020-05-21: 25 mg via ORAL

## 2020-05-21 MED ORDER — LORAZEPAM 2 MG/ML IJ SOLN: 1.0000 mg | Freq: Once | INTRAMUSCULAR | Status: DC

## 2020-05-21 MED ORDER — PREDNISONE 5 MG PO TABS: 25.0000 mg | ORAL_TABLET | Freq: Every day | ORAL | 1 refills | Status: AC

## 2020-05-21 MED ORDER — FAMOTIDINE 20 MG PO TABS: 40.0000 mg | ORAL_TABLET | Freq: Two times a day (BID) | ORAL | Status: DC

## 2020-05-21 MED ORDER — ACETAMINOPHEN 325 MG PO TABS: 650.0000 mg | ORAL_TABLET | ORAL | Status: DC | PRN

## 2020-05-21 MED ORDER — SODIUM CHLORIDE 0.9 % IV SOLN
INTRAVENOUS | Status: AC
  Filled 2020-05-21 (×3): qty 200

## 2020-05-21 MED ORDER — ACD FORMULA A 0.73-2.45-2.2 GM/100ML VI SOLN
1000.0000 mL | Status: DC
  Filled 2020-05-21: qty 1000

## 2020-05-21 MED ORDER — LORAZEPAM 2 MG/ML IJ SOLN
0.5000 mg | Freq: Once | INTRAMUSCULAR | Status: AC | PRN
  Administered 2020-05-21: 0.5 mg via INTRAVENOUS

## 2020-05-21 MED ORDER — SODIUM CHLORIDE 0.9 % IV SOLN
INTRAVENOUS | Status: AC
  Filled 2020-05-21 (×4): qty 200

## 2020-05-21 MED ORDER — ACD FORMULA A 0.73-2.45-2.2 GM/100ML VI SOLN: 1000.0000 mL | Status: DC

## 2020-05-21 MED ORDER — ACETAMINOPHEN 325 MG PO TABS
650.0000 mg | ORAL_TABLET | ORAL | Status: DC | PRN
  Administered 2020-05-21: 650 mg via ORAL
  Filled 2020-05-21: qty 2

## 2020-05-21 MED ORDER — DIPHENHYDRAMINE HCL 25 MG PO CAPS: 25.0000 mg | ORAL_CAPSULE | Freq: Four times a day (QID) | ORAL | Status: DC | PRN

## 2020-05-21 MED ORDER — FAMOTIDINE 20 MG PO TABS
40.0000 mg | ORAL_TABLET | Freq: Two times a day (BID) | ORAL | Status: DC
  Administered 2020-05-21 (×2): 40 mg via ORAL
  Filled 2020-05-21 (×2): qty 2

## 2020-05-21 MED ORDER — CALCIUM CARBONATE ANTACID 500 MG PO CHEW: 2.0000 | CHEWABLE_TABLET | ORAL | Status: AC

## 2020-05-21 MED ORDER — HEPARIN SOD (PORK) LOCK FLUSH 100 UNIT/ML IV SOLN
500.0000 [IU] | INTRAVENOUS | Status: AC | PRN
  Administered 2020-05-21: 500 [IU]
  Filled 2020-05-21: qty 5

## 2020-05-21 MED ORDER — PYRIDOSTIGMINE BROMIDE 60 MG PO TABS
90.0000 mg | ORAL_TABLET | ORAL | 1 refills | Status: DC
Start: 2020-05-21 — End: 2020-07-11

## 2020-05-21 MED ORDER — SODIUM CHLORIDE 0.9 % IV SOLN
3.0000 g | Freq: Once | INTRAVENOUS | Status: AC
  Administered 2020-05-21: 3 g via INTRAVENOUS
  Filled 2020-05-21: qty 30

## 2020-05-21 MED ORDER — SODIUM CHLORIDE 0.9 % IV SOLN
4.0000 g | INTRAVENOUS | Status: DC
  Filled 2020-05-21: qty 40

## 2020-05-21 MED ORDER — CALCIUM CARBONATE ANTACID 500 MG PO CHEW: 2.0000 | CHEWABLE_TABLET | ORAL | Status: DC

## 2020-05-21 MED ORDER — HEPARIN SODIUM (PORCINE) 1000 UNIT/ML IJ SOLN: 1000.0000 [IU] | Freq: Once | INTRAMUSCULAR | Status: DC

## 2020-05-21 MED ORDER — LORAZEPAM 2 MG/ML IJ SOLN
0.5000 mg | Freq: Once | INTRAMUSCULAR | Status: DC | PRN
  Filled 2020-05-21: qty 1

## 2020-05-21 MED ORDER — ACD FORMULA A 0.73-2.45-2.2 GM/100ML VI SOLN
Status: AC
  Administered 2020-05-21: 1000 mL
  Filled 2020-05-21: qty 500

## 2020-05-21 MED ORDER — HEPARIN SODIUM (PORCINE) 1000 UNIT/ML IJ SOLN
1000.0000 [IU] | Freq: Once | INTRAMUSCULAR | Status: AC
  Administered 2020-05-21: 1000 [IU]
  Filled 2020-05-21: qty 1

## 2020-05-21 NOTE — Care Management Important Message (Signed)
Important Message  Patient Details  Name: Brandy [name] MRN: [id number] Date of Birth: 11-Oct-1990   Medicare Important Message Given:  Yes Spoke  With the patient advised that the IM would be mailed to the home address.   Cindel Daugherty 05/21/2020, 12:43 PM

## 2020-05-21 NOTE — Progress Notes (Signed)
Neurology Progress Note   S:// Seen and examined the patient's morning.  O:// Current vital signs: BP 127/77   Pulse 80   Temp 97.8 F (36.6 C) (Axillary)   Resp 20   Ht 5\' 4"  (1.626 m)   Wt 44.2 kg   LMP  (LMP Unknown)   SpO2 100%   BMI 16.73 kg/m  Vital signs in last 24 hours: Temp:  [97.3 F (36.3 C)-97.8 F (36.6 C)] 97.8 F (36.6 C) (12/02 1222) Pulse Rate:  [66-80] 80 (12/02 1222) Resp:  [16-20] 20 (12/02 1222) BP: (109-138)/(68-86) 127/77 (12/02 1222) SpO2:  [99 %-100 %] 100 % (12/02 1222) FiO2 (%):  [28 %] 28 % (12/01 2001) Awake alert Appears anxious appearing No aphasia No dysarthria Cranial examination with no restriction of extraocular movements and no diplopia.  Mild left eye ptosis.  No ptosis in the right eye.  Able to lift both upper and lower extremities antigravity with fatigable weakness.   Medications  Current Facility-Administered Medications:  .  0.9 %  sodium chloride infusion, , Intravenous, Continuous, Bhagat, Srishti L, MD, Last Rate: 75 mL/hr at 05/21/20 0609, New Bag at 05/21/20 0609 .  acetaminophen (TYLENOL) tablet 650 mg, 650 mg, Oral, Q4H PRN, 0610, MD .  acetaminophen (TYLENOL) tablet 650 mg, 650 mg, Oral, Q4H PRN, Erick Blinks, MD .  albumin human 25 % 50 g in sodium chloride 0.9 %, , Dialysis, Q1 Hr x 4, Khaliqdina, Salman, MD .  baclofen (LIORESAL) tablet 10 mg, 10 mg, Oral, QID, Amin, Ankit Chirag, MD, 10 mg at 05/21/20 0651 .  barium (VOLUMEN) 0.1 % suspension 450 mL, 450 mL, Oral, Once, Girguis, David, MD .  budesonide (PULMICORT) nebulizer solution 0.5 mg, 0.5 mg, Nebulization, BID, 14/02/21, MD, 0.5 mg at 05/19/20 2047 .  calcium carbonate (TUMS - dosed in mg elemental calcium) chewable tablet 400 mg of elemental calcium, 2 tablet, Oral, Q3H, 2048, MD .  calcium gluconate 4 g in sodium chloride 0.9 % 100 mL IVPB, 4 g, Intravenous, To Hemo, Erick Blinks, MD .  citrate dextrose  (ACD-A anticoagulant) 0.73-2.45-2.2 GM/100ML solution, , , ,  .  citrate dextrose (ACD-A anticoagulant) solution 1,000 mL, 1,000 mL, Other, Continuous, 06-[birth year]1980, MD, 1,000 mL at 05/19/20 0815 .  citrate dextrose (ACD-A anticoagulant) solution 1,000 mL, 1,000 mL, Other, Continuous, 05/21/20, Salman, MD .  cromolyn (GASTROCROM) solution 200 mg -(patient's HOME MED), 200 mg, Oral, 5 times per day, Amin, Ankit Chirag, MD, 200 mg at 05/21/20 1303 .  cromolyn (INTAL) nebulizer solution 20 mg  -(patient's HOME MED), 20 mg, Nebulization, TID, 14/02/21, MD, 20 mg at 05/21/20 0954 .  cromolyn (INTAL) nebulizer solution 20 mg -(patient's HOME MED), 20 mg, Nebulization, BID PRN, Amin, Ankit Chirag, MD, 20 mg at 05/21/20 0317 .  dextromethorphan-guaiFENesin (MUCINEX DM) 30-600 MG per 12 hr tablet 1 tablet, 1 tablet, Oral, BID PRN, Amin, Ankit Chirag, MD .  diphenhydrAMINE (BENADRYL) capsule 25 mg, 25 mg, Oral, Q6H PRN, 14/02/21, MD, 25 mg at 05/21/20 1057 .  diphenhydrAMINE-zinc acetate (BENADRYL) 2-0.1 % cream 1-2 application, 1-2 application, Topical, Daily PRN, 14/02/21, MD .  docusate sodium (COLACE) capsule 100 mg, 100 mg, Oral, BID PRN, Lorin Glass, MD .  DYE-FREE diphenhydrAMINE (BENADRYL) capsule 25 mg -- PT HOME MED, 25 mg, Oral, Q6H PRN, Lorin Glass, MD, 25 mg at 05/21/20 1057 .  enoxaparin (LOVENOX) injection 30 mg, 30 mg, Subcutaneous, Q24H, Amin, Ankit Chirag,  MD, 30 mg at 05/20/20 1802 .  EPINEPHrine (EPI-PEN) injection 0.3 mg, 0.3 mg, Intramuscular, PRN, Stretch, Marveen Reeks, MD .  famotidine (PEPCID) tablet 40 mg, 40 mg, Oral, Q12H, Pierce, Dwayne A, RPH, 40 mg at 05/21/20 1302 .  Fluticasone-Salmeterol (Wixela) 250-50 MCG/DOSE inhaler 1 puff, 1 puff, Inhalation, BID, Stretch, Marveen Reeks, MD, 1 puff at 05/21/20 0958 .  heparin sodium (porcine) injection 1,000 Units, 1,000 Units, Intracatheter, Once, Erick Blinks, MD .  insulin aspart (novoLOG)  injection 0-6 Units, 0-6 Units, Subcutaneous, Q4H, Lorin Glass, MD .  ipratropium-albuterol (DUONEB) 0.5-2.5 (3) MG/3ML nebulizer solution 3 mL, 3 mL, Nebulization, BID, Cheri Fowler, MD, 3 mL at 05/21/20 0952 .  ipratropium-albuterol (DUONEB) 0.5-2.5 (3) MG/3ML nebulizer solution 3 mL, 3 mL, Nebulization, Q4H PRN, Amin, Ankit Chirag, MD, 3 mL at 05/20/20 1430 .  ivabradine (CORLANOR) tablet 5 mg, 5 mg, Oral, BID WC, Stretch, Marveen Reeks, MD, 5 mg at 05/21/20 9833 .  ketotifen (ZADITOR) 0.025 % ophthalmic solution 1 drop, 1 drop, Both Eyes, Daily PRN, Lorin Glass, MD, 1 drop at 05/18/20 2203 .  levocetirizine (XYZAL) tablet 5 mg - (patient's HOME MED), 5 mg, Oral, Q12H, Cheri Fowler, MD, 5 mg at 05/21/20 0653 .  LORazepam (ATIVAN) injection 0.5 mg, 0.5 mg, Intravenous, Once PRN, Lewie Chamber, MD .  LORazepam (ATIVAN) injection 0.5 mg, 0.5 mg, Intravenous, Once PRN, Lewie Chamber, MD .  ondansetron (ZOFRAN-ODT) disintegrating tablet 6 mg, 6 mg, Oral, Q6H PRN, Erick Blinks, MD, 6 mg at 05/21/20 1305 .  predniSONE (DELTASONE) tablet 25 mg, 25 mg, Oral, Q breakfast, Erick Blinks, MD, 25 mg at 05/21/20 8250 .  pyridostigmine (MESTINON) tablet 90 mg - (patient's HOME MED), 90 mg, Oral, Q3H, Amin, Ankit Chirag, MD, 90 mg at 05/21/20 1302 .  senna-docusate (Senokot-S) tablet 1 tablet, 1 tablet, Oral, QHS PRN, Amin, Ankit Chirag, MD .  sodium chloride flush (NS) 0.9 % injection 10-40 mL, 10-40 mL, Intracatheter, PRN, Cheri Fowler, MD .  Vitamin B-12 (cyanocaobalamine) tablet 1,000 mcg (home med), 1,000 mcg, Oral, Daily, Amin, Ankit Chirag, MD, 1,000 mcg at 05/20/20 1705 .  Vitamin D (Ergocalciferol) (DRISDOL) capsule 50,000 Units, 50,000 Units, Oral, Once per day on Sun Wed, Girguis, Onalee Hua, MD .  zafirlukast (ACCOLATE) tablet 20 mg, 20 mg, Oral, BID Jimmye [facility], MD, 20 mg at 05/21/20 0653 Labs CBC    Component Value Date/Time   WBC 5.3 05/21/2020 0500   RBC 3.41 (L)  05/21/2020 0500   HGB 9.5 (L) 05/21/2020 0500   HCT 29.9 (L) 05/21/2020 0500   PLT 146 (L) 05/21/2020 0500   MCV 87.7 05/21/2020 0500   MCH 27.9 05/21/2020 0500   MCHC 31.8 05/21/2020 0500   RDW 16.4 (H) 05/21/2020 0500   LYMPHSABS 1.3 05/16/2020 0733   MONOABS 0.8 05/16/2020 0733   EOSABS 0.1 05/16/2020 0733   BASOSABS 0.0 05/16/2020 0733    CMP     Component Value Date/Time   NA 139 05/21/2020 1030   K 3.3 (L) 05/21/2020 1030   CL 107 05/21/2020 1030   CO2 24 05/21/2020 1030   GLUCOSE 112 (H) 05/21/2020 1030   BUN 5 (L) 05/21/2020 1030   CREATININE 0.72 05/21/2020 1030   CALCIUM 8.3 (L) 05/21/2020 1030   PROT 4.5 (L) 05/21/2020 0500   ALBUMIN 3.7 05/21/2020 0500   AST 11 (L) 05/21/2020 0500   ALT 10 05/21/2020 0500   ALKPHOS 21 (L) 05/21/2020 0500   BILITOT 2.2 (H) 05/21/2020  0500   GFRNONAA >60 05/21/2020 1030     Imaging No new results brain  Assessment: 29 year old past history of mast cell activation syndrome, Ehlers-Danlos with cervical instability, POTS, 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 Continues to be dissatisfied with a lot of things in the hospital including an timeliness of medication administration, regarding which I have spoken to the pharmacist and the charge nurse along with the attending hospitalist.  Her multiple sensory overload issues preclude a good examination at bedside.  Possible myasthenic exacerbation in addition to multiple other medical issues  Recommendations: From a neurological standpoint, continue current medications. She needs to complete 5 total rounds of plasma exchange.  She has already completed 2 as of yesterday and getting her third round today. She will follow up with outpatient neuromuscular clinic, Dr. Nita Sickle 1 to 2 weeks after her final plasma exchange. She  needs establish care with allergy neurology and rheumatology as an outpatient. I have discussed in detail with her her plan of neurological care.  I have also recommended that for her ongoing care, she should be looking to establish care at the Center where multiple specialties such as allergy immunology, rheumatology and neurology are all available in-house, which unfortunately is not the case at our facility at the current time.  I communicated my plan in detail with primary attending Dr. Frederick Peers  -- Milon Dikes, MD [facility] Pager: [phone] If 7pm to 7am, please call on call as listed on AMION.

## 2020-05-21 NOTE — Progress Notes (Signed)
Patient requesting to speak to Charge Nurse.  Patient was sitting in room holding a med cup with 2 half pills, patient was crying stating that she was not given the correct dose.  States that her Pyridostigmine was given to her incorrectly, states that her dose is 90mg , RN advised that she was given 90mg  because she has 2 half pills which would be 45mg , patient advised that she needs to see all of her pills so that she can make the pills into one whole pill, RN advised patient that no way to make sure the pills are cut in exactly halves, patient threw her medication cup with the pills in the floor.  Patient asked for [facility] papers, RN advised that if she signs out AMA that [facility] would  Not be able to take her home.   Patient then advised that she would not going to [facility].  Patient then asked RN to leave the room, RN was able to find one half of the pills she threw in the floor.  After RN left the room, phlebotomy tech came to nursing station and advised that the patient went in through a bag that was on her bed, got a pill bottle out and patient took a pill.  Patient told phlebotomy tech that she would just take her own meds.  RN was able to see the patient putting a pill bottle back in a blue bag sitting beside that patient on her bed.

## 2020-05-21 NOTE — TOC Progression Note (Signed)
Transition of Care [facility]) - Progression Note    Patient Details  Name: Tiamarie Furnari MRN: [id number] Date of Birth: 1991-05-16  Transition of Care [facility] CM/SW Contact  Kermit Balo, RN Phone Number: 05/21/2020, 4:39 PM  Clinical Narrative:    PCP appt changed d/t the office previously scheduled uses air fresheners and the patient cant tolerate. Appt changed to [facility] who will also assist with the cost as her insurance for [place] is still pending. Transportation arranged for this visit. [employer] follows pt for her IVF at home. CM has spoken to [facility] and they will provide the IVF and supplies and RN to her home. Unable to currently arrange other [facility] services until her medicare/ medicaid are active in [place]. Pt will call when they are active.  CM has filled out form for aides through [facility] but until pts medicaid is active in [place] they can not be submitted. Pt will call CM once this has occurred.  If pt d/c home tonight she will have [facility] arranged. D/c packet started and in chart. Bedside RN updated.    Expected Discharge Plan: Home w Home Health Services Barriers to Discharge: Continued Medical Work up  Expected Discharge Plan and Services Expected Discharge Plan: Home w Home Health Services   Discharge Planning Services: CM Consult Post Acute Care Choice: Home Health Living arrangements for the past 2 months: Apartment                           HH Arranged: PT, OT, Speech Therapy, Social Work           Social Determinants of Health (SDOH) Interventions    Readmission Risk Interventions No flowsheet data found.

## 2020-05-21 NOTE — Consult Note (Signed)
IR received request for GJ tube evaluation. An abdominal x-ray was performed 05/20/20 confirming proper placement of the GJ tube. I assessed the patient at the bedside and found the GJ tube to be in good shape and without any issues. The skin insertion site is clean and dry, no evidence of irritation or skin break down. I was able to aspirate gastric contents without difficulty and both ports flushed without resistance or leaking. I explained all of these findings to the patient and remained at the bedside to answer additional questions she had about feeding tubes in general. The patient states she is going for a plasmapheresis treatment soon and will then be discharged home.   No IR intervention is planned and the order will be deleted. Please call IR with any future questions/concerns.  Alwyn Ren, [place] [phone] 05/21/2020, 12:35 PM

## 2020-05-21 NOTE — Telephone Encounter (Signed)
Copied from CRM [id number]. Topic: General - Other >> May 21, 2020 11:50 AM Herby Abraham C wrote: Reason for CRM: Tresa Endo - Case Manager with [facility] called in to request a call back from provider or her assistant. Tresa Endo states that she would like to speak with the office to update them on some information for pt's apt.  CB: [phone]   Patient has not been seen by Gwinda Passe. Please advise

## 2020-05-21 NOTE — Progress Notes (Addendum)
Did verify with on call md if the patient is still able to go home tonight following H&H draw, dr says "oh yes, she can still go home tonight."  IV team now at bedside, pt states "I think I made it bleed - I was packing my things and moving around a lot when I shouldn't have been."

## 2020-05-21 NOTE — Progress Notes (Signed)
Pt with slight ooze around dialysis cath following dressing change - resolved by holding pressure. Paged IV team stat due to pt in need of dressing change, am told this is an ongoing issue for this patient and that "she is constantly messing with it and last night was hitting it" paged MD to notify as the pt is to discharge this evening. IV team on the way up to change the dressing again. Spoke with on call MD who orders H&H.

## 2020-05-21 NOTE — Progress Notes (Signed)
Pheresis catheter site assessment. Bleeding noted at site. Biopatch in place. Explained to pt that site will need to be left alone to clot and heal. Gauze pressure dressing applied over transparent dressing to slow/stop bleeding. Will reassess for dressing change later.

## 2020-05-22 DIAGNOSIS — E43 Unspecified severe protein-calorie malnutrition: Secondary | ICD-10-CM | POA: Diagnosis not present

## 2020-05-22 LAB — ZINC: Zinc: 67 ug/dL (ref 44–115)

## 2020-05-22 LAB — COPPER, SERUM: Copper: 22 ug/dL — ABNORMAL LOW (ref 80–158)

## 2020-05-22 LAB — VITAMIN B1: Vitamin B1 (Thiamine): 124.3 nmol/L (ref 66.5–200.0)
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
## 2020-05-23 ENCOUNTER — Non-Acute Institutional Stay (HOSPITAL_COMMUNITY)
Admission: EM | Admit: 2020-05-23 | Discharge: 2020-05-23 | Disposition: A | Discharge status: Home / Self Care | Payer: 59 | Attending: Neurology | Admitting: Neurology

## 2020-05-23 ENCOUNTER — Ambulatory Visit (HOSPITAL_COMMUNITY): Admission: RE | Admit: 2020-05-23 | Payer: 59 | Source: Other Acute Inpatient Hospital | Admitting: Neurology

## 2020-05-23 ENCOUNTER — Other Ambulatory Visit: Payer: Self-pay

## 2020-05-23 DIAGNOSIS — Z7952 Long term (current) use of systemic steroids: Secondary | ICD-10-CM | POA: Diagnosis not present

## 2020-05-23 DIAGNOSIS — Z79899 Other long term (current) drug therapy: Secondary | ICD-10-CM | POA: Diagnosis not present

## 2020-05-23 DIAGNOSIS — G7000 Myasthenia gravis without (acute) exacerbation: Secondary | ICD-10-CM | POA: Insufficient documentation

## 2020-05-23 LAB — BASIC METABOLIC PANEL
Anion gap: 8 (ref 5–15)
BUN: <5 mg/dL — ABNORMAL LOW (ref 6–20)
CO2: 24 mmol/L (ref 22–32)
Calcium: 8.4 mg/dL — ABNORMAL LOW (ref 8.9–10.3)
Chloride: 108 mmol/L (ref 98–111)
Creatinine, Ser: 0.56 mg/dL (ref 0.44–1.00)
GFR, Estimated: >60 mL/min (ref >60)
Glucose, Bld: 126 mg/dL — ABNORMAL HIGH (ref 70–99)
Potassium: 3.1 mmol/L — ABNORMAL LOW (ref 3.5–5.1)
Sodium: 140 mmol/L (ref 135–145)

## 2020-05-23 LAB — VITAMIN A: Vitamin A (Retinoic Acid): 3.8 ug/dL — ABNORMAL LOW (ref 18.9–57.3)

## 2020-05-23 LAB — VITAMIN B6: Vitamin B6: 6.8 ug/L (ref 2.0–32.8)

## 2020-05-23 MED ORDER — CALCIUM GLUCONATE-NACL 2-0.675 GM/100ML-% IV SOLN
2.0000 g | Freq: Once | INTRAVENOUS | Status: AC
  Administered 2020-05-23: 2000 mg via INTRAVENOUS
  Filled 2020-05-23 (×2): qty 100

## 2020-05-23 MED ORDER — HEPARIN SODIUM (PORCINE) 1000 UNIT/ML IJ SOLN
INTRAMUSCULAR | Status: AC
  Administered 2020-05-23: 1000 [IU]
  Filled 2020-05-23: qty 4

## 2020-05-23 MED ORDER — ACD FORMULA A 0.73-2.45-2.2 GM/100ML VI SOLN
Status: AC
  Administered 2020-05-23: 1000 mL
  Filled 2020-05-23: qty 500

## 2020-05-23 MED ORDER — DIPHENHYDRAMINE HCL 25 MG PO CAPS: 25.0000 mg | ORAL_CAPSULE | Freq: Four times a day (QID) | ORAL | Status: DC | PRN

## 2020-05-23 MED ORDER — ACD FORMULA A 0.73-2.45-2.2 GM/100ML VI SOLN
1000.0000 mL | Status: DC
  Filled 2020-05-23 (×2): qty 1000

## 2020-05-23 MED ORDER — ACETAMINOPHEN 325 MG PO TABS: 650.0000 mg | ORAL_TABLET | ORAL | Status: DC | PRN

## 2020-05-23 MED ORDER — SODIUM CHLORIDE 0.9 % IV SOLN
INTRAVENOUS | Status: AC
  Filled 2020-05-23 (×3): qty 200

## 2020-05-23 MED ORDER — HEPARIN SODIUM (PORCINE) 1000 UNIT/ML IJ SOLN
1000.0000 [IU] | Freq: Once | INTRAMUSCULAR | Status: DC
  Filled 2020-05-23: qty 1

## 2020-05-23 MED ORDER — CALCIUM GLUCONATE 10 % IV SOLN
2.0000 g | Freq: Once | INTRAVENOUS | Status: DC
  Filled 2020-05-23: qty 20

## 2020-05-23 NOTE — ED Provider Notes (Signed)
[facility] EMERGENCY DEPARTMENT Provider Note   CSN: [id number] Arrival date & time: 05/23/20  1158     History Chief Complaint  Patient presents with  . pt to go to dialysis  . bleeding at HD cath site    Paulene Tayag is a 29 y.o. female.  States that she is here for plasmapheresis exchange.  She has a history of myasthenia gravis among other medical conditions and was told by her physicians to present to the hospital for inpatient plasma phoresis exchange.  She otherwise denies any fevers cough vomiting or diarrhea.  States that she had an allergic reaction yesterday causing a sore throat, she took Benadryl with improvement today.        Past Medical History:  Diagnosis Date  . Myasthenia gravis [facility])     Patient Active Problem List   Diagnosis Date Noted  . Myasthenia gravis (HCC) 05/23/2020  . Protein-calorie malnutrition, severe 05/20/2020  . Myasthenia exacerbation (HCC) 05/16/2020    No past surgical history on file.   OB History   No obstetric history on file.     No family history on file.  Social History   Tobacco Use  . Smoking status: Not on file  Substance Use Topics  . Alcohol use: Not on file  . Drug use: Not on file    Home Medications Prior to Admission medications   Medication Sig Start Date End Date Taking? Authorizing Provider  baclofen (LIORESAL) 10 MG tablet Take 10 mg by mouth See admin instructions. Take one 10 mg tablet four times a day at 6AM, 12PM, 6PM, and 12AM    [provider]  cromolyn (GASTROCROM) 100 MG/5ML solution Take 100 mg by mouth See admin instructions. 2 ampule in water taken 4 times a day before meals/bed and PRN    [provider]  cromolyn (INTAL) 20 MG/2ML nebulizer solution Take 20 mg by nebulization See admin instructions. 1 vial taken 3 times a day and PRN    [provider]  diphenhydrAMINE-zinc acetate (BENADRYL) cream Apply 1-2 application topically daily  as needed (Rash from Mast Cell and Allergies).    [provider]  famotidine (PEPCID) 40 MG tablet Take 40 mg by mouth See admin instructions. Take one 40 mg tablet twice a day at 6AM and 6PM.    [provider]  ketotifen (ZADITOR) 0.025 % ophthalmic solution Place 1 drop into both eyes daily as needed (Mast cell, Allergies, MCS/EI).    [provider]  LORazepam (ATIVAN) 0.5 MG tablet Take 0.5 mg by mouth every 6 (six) hours as needed for anxiety.    [provider]  ondansetron (ZOFRAN-ODT) 8 MG disintegrating tablet Take 8 mg by mouth every 8 (eight) hours as needed for nausea or vomiting.    [provider]  predniSONE (DELTASONE) 5 MG tablet Take 5 tablets (25 mg total) by mouth daily with breakfast. 05/22/20   Lewie Chamber, MD  pyridostigmine (MESTINON) 60 MG tablet Take 1.5 tablets (90 mg total) by mouth every 3 (three) hours. 05/21/20   Lewie Chamber, MD    Allergies    Aspirin, Nsaids, Quinolones, Sulfa antibiotics, Telithromycin, Fludrocortisone, Ambien [zolpidem], Beef-potatoes-spinach [compleat], Botulinum toxins, Chlorhexidine, Codeine, Creon [pancrelipase (lip-prot-amyl)], Cymbalta [duloxetine hcl], Dextrans, Ergotamine, Gabapentin, Gallamine, Hydrocodone, Lactose intolerance (gi), Lunesta [eszopiclone], Macrolides and ketolides, Maprotiline, Marplan [isocarboxazid], Midodrine, Morphine and related, Nitrous oxide, Oxycodone, Pethidine [meperidine], Phenergan [promethazine], Phenobarbital, Pyridium [phenazopyridine], Reglan [metoclopramide], Remeron [mirtazapine], Salicylates, Savella [milnacipran], Scopolamine, Sumatriptan, Tape,  Topiramate, Tricyclic antidepressants, Tums [calcium carbonate], and Vancomycin  Review of Systems   Review of Systems  Constitutional: Negative for fever.  HENT: Negative for ear pain.   Eyes: Negative for pain.  Respiratory: Negative for cough.   Cardiovascular: Negative for chest pain.  Gastrointestinal:  Negative for abdominal pain.  Genitourinary: Negative for flank pain.  Musculoskeletal: Negative for back pain.  Skin: Negative for rash.  Neurological: Negative for headaches.    Physical Exam Updated Vital Signs BP 135/84 (BP Location: Right Arm)   Pulse 80   Temp 97.7 F (36.5 C) (Oral)   Resp 16   LMP  (LMP Unknown)   SpO2 100%   Physical Exam Constitutional:      General: She is not in acute distress.    Appearance: Normal appearance.  HENT:     Head: Normocephalic.     Nose: Nose normal.  Eyes:     Extraocular Movements: Extraocular movements intact.  Cardiovascular:     Rate and Rhythm: Normal rate.  Pulmonary:     Effort: Pulmonary effort is normal.  Abdominal:     Tenderness: There is no abdominal tenderness.  Musculoskeletal:     Cervical back: Normal range of motion.     Comments: Wheelchair dependent, generalized extremity weakness, at her baseline per patient given history of myasthenia gravis.  Skin:    General: Skin is warm.     Comments: Left subclavian line insertion site appears bloody underneath the Tegaderm, no surrounding cellulitis noted.  Neurological:     Mental Status: She is alert.     Comments: Cranial nerves II through XII intact.  Tolerating secretions.  Weakness bilateral upper and lower extremities at her baseline per patient.     ED Results / Procedures / Treatments   Labs (all labs ordered are listed, but only abnormal results are displayed) Labs Reviewed  BASIC METABOLIC PANEL    EKG None  Radiology No results found.  Procedures Procedures (including critical care time)  Medications Ordered in ED Medications  acetaminophen (TYLENOL) tablet 650 mg (has no administration in time range)  diphenhydrAMINE (BENADRYL) capsule 25 mg (has no administration in time range)  calcium gluconate 2 g/ 100 mL sodium chloride IVPB (has no administration in time range)  citrate dextrose (ACD-A anticoagulant) solution 1,000 mL (has no  administration in time range)  albumin human 25 % 50 g in sodium chloride 0.9 % (has no administration in time range)  calcium gluconate inj 10% (1 g) URGENT USE ONLY! (has no administration in time range)  heparin sodium (porcine) injection 1,000 Units (has no administration in time range)  citrate dextrose (ACD-A anticoagulant) 0.73-2.45-2.2 GM/100ML solution (has no administration in time range)    ED Course  I have reviewed the triage vital signs and the nursing notes.  Pertinent labs & imaging results that were available during my care of the patient were reviewed by me and considered in my medical decision making (see chart for details).    MDM Rules/Calculators/A&P                          Per my triage nurse, dialysis center is awaiting patient's arrival to initiate plasmapheresis for her.  Patient to proceed up to dialysis center with evaluation with nephrology.   Final Clinical Impression(s) / ED Diagnoses Final diagnoses:  Myasthenia gravis (HCC)    Rx / DC Orders ED Discharge Orders    None  Cheryll Cockayne, MD 05/23/20 1343

## 2020-05-23 NOTE — ED Notes (Signed)
Hemodialysis has been notified of pt in lobby.

## 2020-05-23 NOTE — ED Notes (Signed)
Dr. Audley Hose at triage for [facility].  Dialysis ready for pt.

## 2020-05-23 NOTE — ED Triage Notes (Signed)
Pt states she is supposed to go to outpatient dialysis on arrival to ED.  Registration called and they reported it would be over an hour before they could come get pt and for pt to check in the ED as a pt.  Pt here for PLEX.  Reports bleeding at HD catheter site that she states happens every time she moves.

## 2020-05-23 NOTE — Discharge Instructions (Signed)
Monday 10AM    Call [phone] with any questions

## 2020-05-23 NOTE — ED Notes (Signed)
Hemodialysis coming to get pt from ED

## 2020-05-26 ENCOUNTER — Non-Acute Institutional Stay (HOSPITAL_COMMUNITY)
Admission: AD | Admit: 2020-05-26 | Discharge: 2020-05-26 | Disposition: A | Discharge status: Home / Self Care | Payer: Medicare Other | Source: Ambulatory Visit | Attending: Neurology | Admitting: Neurology

## 2020-05-26 DIAGNOSIS — G7000 Myasthenia gravis without (acute) exacerbation: Secondary | ICD-10-CM | POA: Diagnosis present

## 2020-05-26 LAB — BASIC METABOLIC PANEL
Anion gap: 8 (ref 5–15)
BUN: <5 mg/dL — ABNORMAL LOW (ref 6–20)
CO2: 28 mmol/L (ref 22–32)
Calcium: 8 mg/dL — ABNORMAL LOW (ref 8.9–10.3)
Chloride: 107 mmol/L (ref 98–111)
Creatinine, Ser: 0.52 mg/dL (ref 0.44–1.00)
GFR, Estimated: >60 mL/min (ref >60)
Glucose, Bld: 116 mg/dL — ABNORMAL HIGH (ref 70–99)
Potassium: 2.8 mmol/L — ABNORMAL LOW (ref 3.5–5.1)
Sodium: 143 mmol/L (ref 135–145)

## 2020-05-26 LAB — CBC
HCT: 29.3 % — ABNORMAL LOW (ref 36.0–46.0)
Hemoglobin: 9.1 g/dL — ABNORMAL LOW (ref 12.0–15.0)
MCH: 28.3 pg (ref 26.0–34.0)
MCHC: 31.1 g/dL (ref 30.0–36.0)
MCV: 91 fL (ref 80.0–100.0)
Platelets: 205 10*3/uL (ref 150–400)
RBC: 3.22 MIL/uL — ABNORMAL LOW (ref 3.87–5.11)
RDW: 16.5 % — ABNORMAL HIGH (ref 11.5–15.5)
WBC: 4.8 10*3/uL (ref 4.0–10.5)
nRBC: 0 % (ref 0.0–0.2)

## 2020-05-26 MED ORDER — HEPARIN SODIUM (PORCINE) 1000 UNIT/ML IJ SOLN
INTRAMUSCULAR | Status: AC
  Administered 2020-05-26: 4000 [IU]
  Filled 2020-05-26: qty 4

## 2020-05-26 MED ORDER — CALCIUM CARBONATE ANTACID 500 MG PO CHEW: 2.0000 | CHEWABLE_TABLET | ORAL | Status: DC

## 2020-05-26 MED ORDER — ACETAMINOPHEN 325 MG PO TABS: 650.0000 mg | ORAL_TABLET | ORAL | Status: DC | PRN

## 2020-05-26 MED ORDER — DIPHENHYDRAMINE HCL 25 MG PO CAPS: 25.0000 mg | ORAL_CAPSULE | Freq: Four times a day (QID) | ORAL | Status: DC | PRN

## 2020-05-26 MED ORDER — SODIUM CHLORIDE 0.9 % IV SOLN
INTRAVENOUS | Status: AC
  Filled 2020-05-26 (×4): qty 200

## 2020-05-26 MED ORDER — HEPARIN SODIUM (PORCINE) 1000 UNIT/ML IJ SOLN: 1000.0000 [IU] | Freq: Once | INTRAMUSCULAR | Status: AC

## 2020-05-26 MED ORDER — CALCIUM GLUCONATE-NACL 2-0.675 GM/100ML-% IV SOLN
2.0000 g | Freq: Once | INTRAVENOUS | Status: AC
  Administered 2020-05-26: 2000 mg via INTRAVENOUS
  Filled 2020-05-26: qty 100

## 2020-05-26 MED ORDER — ACD FORMULA A 0.73-2.45-2.2 GM/100ML VI SOLN
1000.0000 mL | Status: DC
  Administered 2020-05-26: 1000 mL

## 2020-05-26 NOTE — Progress Notes (Signed)
Tolerated Plasmapheresis treatment number five, no adverse events, post vital signs stable. Discharged home

## 2020-05-26 NOTE — Telephone Encounter (Signed)
Spoke with [employer]. Patient has transportation issues and high sensitivity to smells. Requested appt to be virtual. Changed appt type.

## 2020-05-26 NOTE — ED Notes (Signed)
Patient discharged by dialysis

## 2020-05-28 ENCOUNTER — Other Ambulatory Visit: Payer: Self-pay

## 2020-05-28 ENCOUNTER — Encounter (HOSPITAL_COMMUNITY): Payer: Self-pay

## 2020-05-28 ENCOUNTER — Emergency Department (HOSPITAL_COMMUNITY)
Admission: EM | Admit: 2020-05-28 | Discharge: 2020-05-28 | Disposition: A | Discharge status: Home / Self Care | Payer: Medicare Other | Attending: Emergency Medicine | Admitting: Emergency Medicine

## 2020-05-28 ENCOUNTER — Emergency Department (HOSPITAL_COMMUNITY): Payer: Medicare Other

## 2020-05-28 DIAGNOSIS — E8809 Other disorders of plasma-protein metabolism, not elsewhere classified: Secondary | ICD-10-CM | POA: Insufficient documentation

## 2020-05-28 DIAGNOSIS — E876 Hypokalemia: Secondary | ICD-10-CM | POA: Diagnosis not present

## 2020-05-28 DIAGNOSIS — R64 Cachexia: Secondary | ICD-10-CM | POA: Diagnosis not present

## 2020-05-28 DIAGNOSIS — J45909 Unspecified asthma, uncomplicated: Secondary | ICD-10-CM | POA: Insufficient documentation

## 2020-05-28 DIAGNOSIS — T82838A Hemorrhage of vascular prosthetic devices, implants and grafts, initial encounter: Secondary | ICD-10-CM | POA: Insufficient documentation

## 2020-05-28 LAB — COMPREHENSIVE METABOLIC PANEL
ALT: 11 U/L (ref 0–44)
AST: 15 U/L (ref 15–41)
Albumin: 3.8 g/dL (ref 3.5–5.0)
Alkaline Phosphatase: 17 U/L — ABNORMAL LOW (ref 38–126)
Anion gap: 9 (ref 5–15)
BUN: <5 mg/dL — ABNORMAL LOW (ref 6–20)
CO2: 26 mmol/L (ref 22–32)
Calcium: 8.3 mg/dL — ABNORMAL LOW (ref 8.9–10.3)
Chloride: 109 mmol/L (ref 98–111)
Creatinine, Ser: 0.61 mg/dL (ref 0.44–1.00)
GFR, Estimated: >60 mL/min (ref >60)
Glucose, Bld: 96 mg/dL (ref 70–99)
Potassium: 2.7 mmol/L — CL (ref 3.5–5.1)
Sodium: 144 mmol/L (ref 135–145)
Total Bilirubin: 1.2 mg/dL (ref 0.3–1.2)
Total Protein: 4.2 g/dL — ABNORMAL LOW (ref 6.5–8.1)

## 2020-05-28 LAB — CBC
HCT: 29 % — ABNORMAL LOW (ref 36.0–46.0)
Hemoglobin: 9.3 g/dL — ABNORMAL LOW (ref 12.0–15.0)
MCH: 28.4 pg (ref 26.0–34.0)
MCHC: 32.1 g/dL (ref 30.0–36.0)
MCV: 88.7 fL (ref 80.0–100.0)
Platelets: 201 10*3/uL (ref 150–400)
RBC: 3.27 MIL/uL — ABNORMAL LOW (ref 3.87–5.11)
RDW: 16.7 % — ABNORMAL HIGH (ref 11.5–15.5)
WBC: 6.2 10*3/uL (ref 4.0–10.5)
nRBC: 0 % (ref 0.0–0.2)

## 2020-05-28 LAB — PROTIME-INR
INR: 1.8 — ABNORMAL HIGH (ref 0.8–1.2)
Prothrombin Time: 20.6 seconds — ABNORMAL HIGH (ref 11.4–15.2)

## 2020-05-28 LAB — APTT: aPTT: 43 seconds — ABNORMAL HIGH (ref 24–36)

## 2020-05-28 LAB — MAGNESIUM: Magnesium: 1.5 mg/dL — ABNORMAL LOW (ref 1.7–2.4)

## 2020-05-28 MED ORDER — MAGNESIUM SULFATE 2 GM/50ML IV SOLN
2.0000 g | INTRAVENOUS | Status: AC
  Administered 2020-05-28: 2 g via INTRAVENOUS
  Filled 2020-05-28: qty 50

## 2020-05-28 MED ORDER — SODIUM CHLORIDE 0.9% FLUSH: 10.0000 mL | Freq: Two times a day (BID) | INTRAVENOUS | Status: DC

## 2020-05-28 MED ORDER — POTASSIUM CHLORIDE 10 MEQ/100ML IV SOLN
10.0000 meq | Freq: Once | INTRAVENOUS | Status: AC
  Administered 2020-05-28: 10 meq via INTRAVENOUS
  Filled 2020-05-28: qty 100

## 2020-05-28 MED ORDER — SODIUM CHLORIDE 0.9% FLUSH: 10.0000 mL | INTRAVENOUS | Status: DC | PRN

## 2020-05-28 MED ORDER — SODIUM CHLORIDE 0.9 % IV BOLUS
1000.0000 mL | Freq: Once | INTRAVENOUS | Status: AC
  Administered 2020-05-28: 1000 mL via INTRAVENOUS

## 2020-05-28 NOTE — ED Notes (Signed)
Report given to [facility] personnel. Discharge papers given.

## 2020-05-28 NOTE — ED Notes (Signed)
Date and time results received: 05/28/20 0355 (use smartphrase ".now" to insert current time)  Test: potassium Critical Value: 2.7  Name of Provider Notified: Ward DO

## 2020-05-28 NOTE — ED Notes (Signed)
Pt pulled out IV in L hand. Placed gauze and coband around site.

## 2020-05-28 NOTE — Discharge Instructions (Addendum)
Today it was noted that your potassium and your magnesium were both low.  Both of these were supplemented in the emergency department.  Please continue to monitor your central catheter.  If your symptoms change or worsen, please return to the emergency department.

## 2020-05-28 NOTE — ED Notes (Signed)
[facility] arrived at bedside.  

## 2020-05-28 NOTE — ED Notes (Signed)
Pt able to void using purewick while in room.

## 2020-05-28 NOTE — ED Notes (Addendum)
Pt seen by several staff -crying, hitting the wall, covering ears with both hands. When prompted in order to keep pt comfortable, pt will continue to cry.

## 2020-05-28 NOTE — ED Notes (Signed)
Called [facility] to transport patient back to home address:  [address]    [facility] [address]    2L of Oxygen required.

## 2020-05-28 NOTE — ED Notes (Signed)
Explained to pt again importance of why she is being transferred to hallway.

## 2020-05-28 NOTE — ED Notes (Signed)
Provided pt with pen and pencil so pt can write down / communicate - pt does not acknowledge staff.

## 2020-05-28 NOTE — ED Provider Notes (Signed)
[facility] EMERGENCY DEPARTMENT Provider Note   CSN: [id number] Arrival date & time: 05/28/20  9030     History No chief complaint on file.   Brandy [name] is a 29 y.o. female.  The history is provided by the patient. No language interpreter was used.   29 year old with history of myasthenia gravis, Ehlers-Danlos with cervical instability (wears C-collar), dysautonomia, wasting syndrome, malabsorption, muscle activation disorder, POTS, GERD, IBS, partial seizure, asthma, anxiety presenting with complication of left subclavian central line, through which she is getting plasmapheresis.  She states that she has noticed some increased bleeding from the site of the catheter and sometimes has difficulty controlling the bleeding.  She also reports that she feels a bit dehydrated and is concerned that her potassium is low despite taking oral potassium.  She denies any fevers or chills.  Denies any other symptoms currently.  She had her last plasmapheresis 2 days ago without complication.    Past Medical History:  Diagnosis Date  . Myasthenia gravis [facility])     Patient Active Problem List   Diagnosis Date Noted  . Myasthenia gravis (HCC) 05/23/2020  . Protein-calorie malnutrition, severe 05/20/2020  . Myasthenia exacerbation (HCC) 05/16/2020    No past surgical history on file.   OB History   No obstetric history on file.     No family history on file.     Home Medications Prior to Admission medications   Medication Sig Start Date End Date Taking? Authorizing Provider  baclofen (LIORESAL) 10 MG tablet Take 10 mg by mouth See admin instructions. Take one 10 mg tablet four times a day at 6AM, 12PM, 6PM, and 12AM    [provider]  cromolyn (GASTROCROM) 100 MG/5ML solution Take 100 mg by mouth See admin instructions. 2 ampule in water taken 4 times a day before meals/bed and PRN    [provider]  cromolyn (INTAL) 20 MG/2ML nebulizer  solution Take 20 mg by nebulization See admin instructions. 1 vial taken 3 times a day and PRN    [provider]  diphenhydrAMINE-zinc acetate (BENADRYL) cream Apply 1-2 application topically daily as needed (Rash from Mast Cell and Allergies).    [provider]  famotidine (PEPCID) 40 MG tablet Take 40 mg by mouth See admin instructions. Take one 40 mg tablet twice a day at 6AM and 6PM.    [provider]  ketotifen (ZADITOR) 0.025 % ophthalmic solution Place 1 drop into both eyes daily as needed (Mast cell, Allergies, MCS/EI).    [provider]  LORazepam (ATIVAN) 0.5 MG tablet Take 0.5 mg by mouth every 6 (six) hours as needed for anxiety.    [provider]  ondansetron (ZOFRAN-ODT) 8 MG disintegrating tablet Take 8 mg by mouth every 8 (eight) hours as needed for nausea or vomiting.    [provider]  predniSONE (DELTASONE) 5 MG tablet Take 5 tablets (25 mg total) by mouth daily with breakfast. 05/22/20   Lewie Chamber, MD  pyridostigmine (MESTINON) 60 MG tablet Take 1.5 tablets (90 mg total) by mouth every 3 (three) hours. 05/21/20   Lewie Chamber, MD    Allergies    Aspirin, Nsaids, Quinolones, Sulfa antibiotics, Telithromycin, Fludrocortisone, Ambien [zolpidem], Beef-potatoes-spinach [compleat], Botulinum toxins, Chlorhexidine, Codeine, Creon [pancrelipase (lip-prot-amyl)], Cymbalta [duloxetine hcl], Dextrans, Ergotamine, Gabapentin, Gallamine, Hydrocodone, Lactose intolerance (gi), Lunesta [eszopiclone], Macrolides and ketolides, Maprotiline, Marplan [isocarboxazid], Midodrine, Morphine and related, Nitrous oxide, Oxycodone, Pethidine [meperidine], Phenergan [promethazine], Phenobarbital, Pyridium [phenazopyridine], Reglan [metoclopramide], Remeron [  mirtazapine], Salicylates, Savella [milnacipran], Scopolamine, Sumatriptan, Tape, Topiramate, Tricyclic antidepressants, Tums [calcium carbonate], and Vancomycin  Review of Systems   Review  of Systems  All other systems reviewed and are negative.   Physical Exam Updated Vital Signs LMP  (LMP Unknown)   Physical Exam Physical Exam Constitutional:      General: She is not in acute distress.    Appearance: Normal appearance.  HENT:     Head: Normocephalic.     Nose: Nose normal. Wearing full face respirator Eyes:     Extraocular Movements: Extraocular movements intact.  Cardiovascular:     Rate and Rhythm: Normal rate.  Pulmonary:     Effort: Pulmonary effort is normal.  Abdominal:     Tenderness: There is no abdominal tenderness.  Musculoskeletal:     Cervical back: In c-collar for instability (baseline).     Comments: Wheelchair dependent, generalized extremity weakness, at her baseline per patient given history of myasthenia gravis.  Skin:    General: Skin is warm.     Comments: Left subclavian line insertion site appears bloody underneath the Tegaderm, no surrounding cellulitis noted.  Neurological:     Mental Status: She is alert.     Comments: CN 3-12 grossly intact.  ED Results / Procedures / Treatments   Labs (all labs ordered are listed, but only abnormal results are displayed) Labs Reviewed  CBC  COMPREHENSIVE METABOLIC PANEL  PROTIME-INR  APTT    EKG None  Radiology No results found.  Procedures Procedures (including critical care time)  Medications Ordered in ED Medications  sodium chloride 0.9 % bolus 1,000 mL (has no administration in time range)    ED Course  I have reviewed the triage vital signs and the nursing notes.  Pertinent labs & imaging results that were available during my care of the patient were reviewed by me and considered in my medical decision making (see chart for details).    MDM Rules/Calculators/A&P                          Patient with myasthenia gravis.  She states that she has been bleeding from her central line, where she gets her plasmapheresis.  The line was inspected by the IV team.  There is no  active bleeding, no evidence of infection.  Placement is appropriate as demonstrated on x-ray.  Patient is noted to have low potassium and low magnesium.  Both of these have been replenished in the ED.  She was given a liter of fluid.  At this time, do not feel that further work-up or treatment is indicated, believe patient to be stable for discharge and outpatient follow-up.  She is agreeable with this plan, and is anxious to get home as she has several follow-up appointments today.   Final Clinical Impression(s) / ED Diagnoses Final diagnoses:  Bleeding from peripherally inserted central catheter (PICC), initial encounter (HCC)  Hypokalemia  Hypomagnesemia    Rx / DC Orders ED Discharge Orders    None       Roxy Horseman, PA-C 05/28/20 0438    Ward, Layla Maw, DO 05/28/20 [phone]

## 2020-05-28 NOTE — ED Notes (Signed)
Pt states "I came in with my port already accessed, so I dont need it to be de-accessed".

## 2020-05-28 NOTE — ED Notes (Signed)
Explained to pt that while waiting for [facility] she's going to be transferred to the hallway as there is a critical pt coming into room.

## 2020-05-28 NOTE — ED Triage Notes (Signed)
Pt bibems from home with c/o bleeding from her central line x1day. Pt gets plasmapheresis. Pt also c/o bleeding from her g/j tube. Pt alert and oriented x4.

## 2020-05-30 ENCOUNTER — Encounter (HOSPITAL_COMMUNITY): Payer: Self-pay

## 2020-05-30 ENCOUNTER — Emergency Department (HOSPITAL_COMMUNITY)
Admission: EM | Admit: 2020-05-30 | Discharge: 2020-05-30 | Disposition: A | Discharge status: Home / Self Care | Payer: Medicare Other | Attending: Emergency Medicine | Admitting: Emergency Medicine

## 2020-05-30 ENCOUNTER — Other Ambulatory Visit: Payer: Self-pay

## 2020-05-30 DIAGNOSIS — T82838A Hemorrhage of vascular prosthetic devices, implants and grafts, initial encounter: Secondary | ICD-10-CM | POA: Diagnosis not present

## 2020-05-30 DIAGNOSIS — R110 Nausea: Secondary | ICD-10-CM | POA: Insufficient documentation

## 2020-05-30 DIAGNOSIS — Z87891 Personal history of nicotine dependence: Secondary | ICD-10-CM | POA: Insufficient documentation

## 2020-05-30 DIAGNOSIS — E860 Dehydration: Secondary | ICD-10-CM | POA: Diagnosis not present

## 2020-05-30 DIAGNOSIS — Z4800 Encounter for change or removal of nonsurgical wound dressing: Secondary | ICD-10-CM

## 2020-05-30 MED ORDER — HEPARIN SOD (PORK) LOCK FLUSH 100 UNIT/ML IV SOLN
500.0000 [IU] | Freq: Once | INTRAVENOUS | Status: AC
  Administered 2020-05-30: 500 [IU]
  Filled 2020-05-30: qty 5

## 2020-05-30 MED ORDER — SODIUM CHLORIDE 0.9 % IV BOLUS
1000.0000 mL | Freq: Once | INTRAVENOUS | Status: AC
  Administered 2020-05-30: 1000 mL via INTRAVENOUS

## 2020-05-30 MED ORDER — ONDANSETRON HCL 4 MG/2ML IJ SOLN
4.0000 mg | Freq: Once | INTRAMUSCULAR | Status: AC
  Administered 2020-05-30: 4 mg via INTRAVENOUS
  Filled 2020-05-30: qty 2

## 2020-05-30 NOTE — ED Notes (Signed)
Non emergent transportation called. Stated that [facility] is backed up and pt will be picked up as soon as they can get here.

## 2020-05-30 NOTE — ED Provider Notes (Signed)
[facility]-EMERGENCY DEPT Provider Note   CSN: [id number] Arrival date & time: 05/30/20  0100     History Chief Complaint  Patient presents with  . Dressing Change    Brandy [name] is a 29 y.o. female.  Patient with complicated medical history including myasthenia gravis, Ehlers danlos, POTS, GERD, seizure, dysautonomia presents with concern for bleeding from her left subclavian port. Port was placed 2 weeks ago to facilitate plasmapheresis. Last injustion with 4 days ago without difficulty, and she reports she will meet with her neurologist to discuss continued treatment. She noticed blood around the port tonight but that it is significantly less bleeding that 2 days ago when seen in the ED for similar problem. She feels dehydrated and reports chronic nausea that is treated with Zofran. No fever, pain, SOB.  The history is provided by the patient. No language interpreter was used.       Past Medical History:  Diagnosis Date  . Myasthenia gravis [facility])     Patient Active Problem List   Diagnosis Date Noted  . Myasthenia gravis (HCC) 05/23/2020  . Protein-calorie malnutrition, severe 05/20/2020  . Myasthenia exacerbation (HCC) 05/16/2020    History reviewed. No pertinent surgical history.   OB History   No obstetric history on file.     History reviewed. No pertinent family history.  Social History   Tobacco Use  . Smoking status: Former Games developer  . Smokeless tobacco: Never Used    Home Medications Prior to Admission medications   Medication Sig Start Date End Date Taking? Authorizing Provider  baclofen (LIORESAL) 10 MG tablet Take 10 mg by mouth See admin instructions. Take one 10 mg tablet four times a day at 6AM, 12PM, 6PM, and 12AM    [provider]  cromolyn (GASTROCROM) 100 MG/5ML solution Take 100 mg by mouth See admin instructions. 2 ampule in water taken 4 times a day before meals/bed and PRN    [provider]   cromolyn (INTAL) 20 MG/2ML nebulizer solution Take 20 mg by nebulization See admin instructions. 1 vial taken 3 times a day and PRN    [provider]  diphenhydrAMINE-zinc acetate (BENADRYL) cream Apply 1-2 application topically daily as needed (Rash from Mast Cell and Allergies).    [provider]  famotidine (PEPCID) 40 MG tablet Take 40 mg by mouth See admin instructions. Take one 40 mg tablet twice a day at 6AM and 6PM.    [provider]  ketotifen (ZADITOR) 0.025 % ophthalmic solution Place 1 drop into both eyes daily as needed (Mast cell, Allergies, MCS/EI).    [provider]  LORazepam (ATIVAN) 0.5 MG tablet Take 0.5 mg by mouth every 6 (six) hours as needed for anxiety.    [provider]  ondansetron (ZOFRAN-ODT) 8 MG disintegrating tablet Take 8 mg by mouth every 8 (eight) hours as needed for nausea or vomiting.    [provider]  predniSONE (DELTASONE) 5 MG tablet Take 5 tablets (25 mg total) by mouth daily with breakfast. 05/22/20   Lewie Chamber, MD  pyridostigmine (MESTINON) 60 MG tablet Take 1.5 tablets (90 mg total) by mouth every 3 (three) hours. 05/21/20   Lewie Chamber, MD    Allergies    Aspirin, Nsaids, Quinolones, Sulfa antibiotics, Telithromycin, Fludrocortisone, Ambien [zolpidem], Beef-potatoes-spinach [compleat], Botulinum toxins, Chlorhexidine, Codeine, Creon [pancrelipase (lip-prot-amyl)], Cymbalta [duloxetine hcl], Dextrans, Ergotamine, Gabapentin, Gallamine, Hydrocodone, Lactose intolerance (gi), Lunesta [eszopiclone], Macrolides and ketolides, Maprotiline, Marplan [isocarboxazid], Midodrine, Morphine and related,  Nitrous oxide, Oxycodone, Pethidine [meperidine], Phenergan [promethazine], Phenobarbital, Pyridium [phenazopyridine], Reglan [metoclopramide], Remeron [mirtazapine], Salicylates, Savella [milnacipran], Scopolamine, Sumatriptan, Tape, Topiramate, Tricyclic antidepressants, Tums [calcium carbonate], and  Vancomycin  Review of Systems   Review of Systems  Constitutional: Negative for chills and fever.       See HPI - bleeding around left subclavian port.  HENT: Negative.   Respiratory: Negative.   Cardiovascular: Negative.   Gastrointestinal: Positive for nausea.  Musculoskeletal: Negative.   Skin: Negative.   Neurological: Negative.     Physical Exam Updated Vital Signs BP 125/69 (BP Location: Right Arm)   Pulse 64   Temp 99 F (37.2 C) (Oral)   Resp (!) 25   LMP  (LMP Unknown)   SpO2 100%   Physical Exam Vitals and nursing note reviewed.  Constitutional:      General: She is not in acute distress.    Comments: Chronically ill appearing patient in NAD.   HENT:     Head: Normocephalic and atraumatic.  Neck:     Comments: C-collar present, always in use. Cardiovascular:     Rate and Rhythm: Normal rate.  Pulmonary:     Effort: Pulmonary effort is normal.     Comments: Left upper chest PORT intact with blood present but no active bleeding visualized.  Chest:     Chest wall: No tenderness.  Musculoskeletal:     Cervical back: Normal range of motion and neck supple.  Skin:    General: Skin is warm and dry.  Neurological:     Mental Status: She is alert.     ED Results / Procedures / Treatments   Labs (all labs ordered are listed, but only abnormal results are displayed) Labs Reviewed - No data to display  EKG None  Radiology No results found.  Procedures Procedures (including critical care time)  Medications Ordered in ED Medications  ondansetron (ZOFRAN) injection 4 mg (has no administration in time range)  sodium chloride 0.9 % bolus 1,000 mL (has no administration in time range)    ED Course  I have reviewed the triage vital signs and the nursing notes.  Pertinent labs & imaging results that were available during my care of the patient were reviewed by me and considered in my medical decision making (see chart for details).    MDM  Rules/Calculators/A&P                          Patient here for assessment of her left chest (subclavian) port that has some blood around the site. No active bleeding.   She is requesting IV fluids because she feels "dry". She has chronic nausea and is requesting Zofran. These are provided for patient comfort.   IV Team consultation placed to evaluate Port for stability and placement.   Fluids complete, Zofran for nausea with resolution. IV team has evaluated the port and changed bandage. No sign of displacement or continuous bleed. She is stable for discharge home.   Final Clinical Impression(s) / ED Diagnoses Final diagnoses:  None   1. Dressing change 2. Chronic nausea  Rx / DC Orders ED Discharge Orders    None       Elpidio Anis, Cordelia Poche 05/31/20 2216    Molpus, Jonny Ruiz, MD 06/02/20 [phone]

## 2020-05-30 NOTE — ED Triage Notes (Signed)
Pt bib ems from home with c/o of bleeding from her central line. Pt requesting a dressing change due to blood.   Hx of Myasthenia Gravis . A&O x 4   EMS vitals: BP 118/62 SPO2 100 % RR 24  HR 88

## 2020-05-30 NOTE — ED Notes (Signed)
Delay due to IV team. Attempted to call x2 to get ETA, unable to reach.

## 2020-05-30 NOTE — ED Notes (Signed)
Patient upset and states her fluids are running "too quickly." Discussed with patient that the fluids are going at the rate that was ordered. Patient upset by this and clamped her port until we change the right. Melvenia Beam, PA made aware. Fluid rate changed for patient comfort.

## 2020-05-30 NOTE — Discharge Instructions (Addendum)
See your doctor as planned for further treatment using the left subclavian port access.

## 2020-06-01 ENCOUNTER — Encounter (HOSPITAL_COMMUNITY): Payer: Self-pay | Admitting: *Deleted

## 2020-06-01 NOTE — Progress Notes (Signed)
Dr Wilford Corner called requesting that this pt CVL be removed as it is no longer needed. I advised that we can schedule her for tomorrow at 1000am. Dr Wilford Corner states that he will call pt social worker and relay this information. Pt social worker called this rn to advise that pt is allergic to lidocaine and that she is arranging transportation for pt to come to [facility] and be transported back home. She inquired about how long the procedure will take and I advised that usually this type of procedure would take around a hour given there are no unforseen emergencies in the dept. She was appreciative of this information. Pt is on the schedule for 1000 tomorrow for CVL removal.

## 2020-06-02 ENCOUNTER — Inpatient Hospital Stay (HOSPITAL_COMMUNITY)
Admission: RE | Admit: 2020-06-02 | Discharge: 2020-06-02 | Disposition: A | Discharge status: Home / Self Care | Payer: 59 | Source: Ambulatory Visit

## 2020-06-02 NOTE — Progress Notes (Signed)
Pt called in stating she would like to apologize for her tone in the phone call earlier. She asked if she is able to get the orders in place can she have her appt rescheduled for tomorrow. I advised that we need to see that the orders are in place and then we can schedule the appt according to the patients specific needs. She states that she is going to try to call Dr Wilford Corner office to see if he will send in orders.

## 2020-06-02 NOTE — Progress Notes (Signed)
Phone call to pt to advise her to check into the emergency room for line removal. Pt states that she will have to be finished in time to pick up her son from school at 3pm. I told pt that I am not sure how long the wait is in the emergency room but that she can let them know when she checks in that she will need to leave by a certain time. Pt is very upset and states "I will just keep the line in, I need it for my meds anyway. It'll have to be after the first of the year."

## 2020-06-02 NOTE — Progress Notes (Signed)
Pt called back in with concerns about line removal. She left a message for IR to call back. I called this pt back and she is asking to come to the ER at 0800 and, in addition, have an appt scheduled for 12 or 1pm tomorrow in radiology. She states that she has her medical documents in hand and will bring the protocols that she needs [place] to follow so that her condition(s) are not exacerbated. I advised that I cannot guarantee that coming to the emergency room at 0800 means that she will be seen and ready to have the line removed. I advised that I do not know how busy the emergency room is or will be by tomorrow morning and that the patients are triaged by the necessity of their ailments. The patient states that she is frustrated because she has been to the ER multiple times when she "had time" and that the staff "would not touch it." She states that she just regained custody of her child and now has child care issues that impact when she can schedule appts. She states that her social worker called her "late last night" to tell her about her appt today. The patient then questions if we are truly a fragrance free facility because if there is someone in the dept she will be in with a fragrance on then she will have an exacerbation. I advised that we are a fragrance free facility. The patient asks if it would be ok for her to "take my own zofran and benadryl" and if she does that questions "can I get IV ativan or IV benadryl for the procedure?" I advised the patient that in order to receive sedation for a procedure that she will need to have nothing by mouth for several hours prior and she will need appropriate transportation home and someone to stay with her overnight. She states that has never been the case before but she "just moved here from [place], so maybe it is different there."  I advised the patient that the two options that we have are 1-arrive to radiology at 2pm for line removal without any additional  medications; 2-meet with new PCP Thursday (she already has this appt scheduled) inform them of this situation and maybe they will be willing to place orders needed for her to have central line removed. Pt then states she will take the appt for tomorrow. I advised her that we do not have any additional orders for her and currently we will only be able to do what is ordered. I have rescheduled this pt for 1400- 06/03/20

## 2020-06-02 NOTE — Progress Notes (Signed)
Pt called to IR this morning concerned about receiving specific medication regimen before and after her line removal today. She states that her social worker is adamant that she has this line removed but that she cannot have this done without having her specific fluids and medications related to her medical condition. I advised that we do not have orders for this and we would need her physician to fax the orders or place them in epic. Pt then states that she does not have a primary care physician, only consulting physicians and they are not able to place these orders. Pt is very demanding about her needs over the phone and states that she has to go so she can finish getting ready so her transportation doesn't leave her and ends the call. I placed a phone call to Dr Wilford Corner who requested that the line be removed and he advised that the patient should come to the emergency room to have the line removed.

## 2020-06-03 ENCOUNTER — Emergency Department (HOSPITAL_COMMUNITY)
Admission: EM | Admit: 2020-06-03 | Discharge: 2020-06-03 | Disposition: A | Discharge status: Home / Self Care | Payer: 59 | Attending: Emergency Medicine | Admitting: Emergency Medicine

## 2020-06-03 ENCOUNTER — Emergency Department (HOSPITAL_COMMUNITY): Payer: 59

## 2020-06-03 ENCOUNTER — Encounter (HOSPITAL_COMMUNITY): Payer: Self-pay

## 2020-06-03 ENCOUNTER — Other Ambulatory Visit (HOSPITAL_COMMUNITY): Payer: 59

## 2020-06-03 ENCOUNTER — Other Ambulatory Visit: Payer: Self-pay

## 2020-06-03 DIAGNOSIS — Z882 Allergy status to sulfonamides status: Secondary | ICD-10-CM | POA: Diagnosis not present

## 2020-06-03 DIAGNOSIS — Z881 Allergy status to other antibiotic agents status: Secondary | ICD-10-CM | POA: Diagnosis not present

## 2020-06-03 DIAGNOSIS — Z79899 Other long term (current) drug therapy: Secondary | ICD-10-CM | POA: Insufficient documentation

## 2020-06-03 DIAGNOSIS — Z888 Allergy status to other drugs, medicaments and biological substances status: Secondary | ICD-10-CM | POA: Insufficient documentation

## 2020-06-03 DIAGNOSIS — Z886 Allergy status to analgesic agent status: Secondary | ICD-10-CM | POA: Insufficient documentation

## 2020-06-03 DIAGNOSIS — Z885 Allergy status to narcotic agent status: Secondary | ICD-10-CM | POA: Diagnosis not present

## 2020-06-03 DIAGNOSIS — Z4901 Encounter for fitting and adjustment of extracorporeal dialysis catheter: Secondary | ICD-10-CM | POA: Insufficient documentation

## 2020-06-03 DIAGNOSIS — Z87891 Personal history of nicotine dependence: Secondary | ICD-10-CM | POA: Insufficient documentation

## 2020-06-03 DIAGNOSIS — F419 Anxiety disorder, unspecified: Secondary | ICD-10-CM | POA: Insufficient documentation

## 2020-06-03 DIAGNOSIS — G7001 Myasthenia gravis with (acute) exacerbation: Secondary | ICD-10-CM | POA: Diagnosis not present

## 2020-06-03 DIAGNOSIS — M79675 Pain in left toe(s): Secondary | ICD-10-CM | POA: Insufficient documentation

## 2020-06-03 HISTORY — PX: IR REMOVAL TUN CV CATH W/O FL: IMG2289

## 2020-06-03 LAB — COMPREHENSIVE METABOLIC PANEL
ALT: 19 U/L (ref 0–44)
AST: 17 U/L (ref 15–41)
Albumin: 3.7 g/dL (ref 3.5–5.0)
Alkaline Phosphatase: 40 U/L (ref 38–126)
Anion gap: 7 (ref 5–15)
BUN: <5 mg/dL — ABNORMAL LOW (ref 6–20)
CO2: 28 mmol/L (ref 22–32)
Calcium: 8.7 mg/dL — ABNORMAL LOW (ref 8.9–10.3)
Chloride: 105 mmol/L (ref 98–111)
Creatinine, Ser: 0.58 mg/dL (ref 0.44–1.00)
GFR, Estimated: >60 mL/min (ref >60)
Glucose, Bld: 121 mg/dL — ABNORMAL HIGH (ref 70–99)
Potassium: 3.7 mmol/L (ref 3.5–5.1)
Sodium: 140 mmol/L (ref 135–145)
Total Bilirubin: 1.2 mg/dL (ref 0.3–1.2)
Total Protein: 5.2 g/dL — ABNORMAL LOW (ref 6.5–8.1)

## 2020-06-03 LAB — CBC WITH DIFFERENTIAL/PLATELET
Abs Immature Granulocytes: 0.02 10*3/uL (ref 0.00–0.07)
Basophils Absolute: 0 10*3/uL (ref 0.0–0.1)
Basophils Relative: 0 %
Eosinophils Absolute: 0 10*3/uL (ref 0.0–0.5)
Eosinophils Relative: 0 %
HCT: 31.4 % — ABNORMAL LOW (ref 36.0–46.0)
Hemoglobin: 9.5 g/dL — ABNORMAL LOW (ref 12.0–15.0)
Immature Granulocytes: 1 %
Lymphocytes Relative: 7 %
Lymphs Abs: 0.3 10*3/uL — ABNORMAL LOW (ref 0.7–4.0)
MCH: 27.5 pg (ref 26.0–34.0)
MCHC: 30.3 g/dL (ref 30.0–36.0)
MCV: 91 fL (ref 80.0–100.0)
Monocytes Absolute: 0.1 10*3/uL (ref 0.1–1.0)
Monocytes Relative: 3 %
Neutro Abs: 3.9 10*3/uL (ref 1.7–7.7)
Neutrophils Relative %: 89 %
Platelets: 252 10*3/uL (ref 150–400)
RBC: 3.45 MIL/uL — ABNORMAL LOW (ref 3.87–5.11)
RDW: 16.3 % — ABNORMAL HIGH (ref 11.5–15.5)
WBC: 4.3 10*3/uL (ref 4.0–10.5)
nRBC: 0 % (ref 0.0–0.2)

## 2020-06-03 LAB — MAGNESIUM: Magnesium: 2.3 mg/dL (ref 1.7–2.4)

## 2020-06-03 MED ORDER — TETRACAINE HCL 1 % IJ SOLN
5.0000 mg | Freq: Once | INTRAMUSCULAR | Status: DC
  Filled 2020-06-03: qty 2

## 2020-06-03 MED ORDER — LORAZEPAM 2 MG/ML IJ SOLN
1.0000 mg | Freq: Once | INTRAMUSCULAR | Status: AC
  Administered 2020-06-03: 1 mg via INTRAVENOUS
  Filled 2020-06-03: qty 1

## 2020-06-03 MED ORDER — POTASSIUM CHLORIDE IN NACL 40-0.9 MEQ/L-% IV SOLN
INTRAVENOUS | Status: DC
  Filled 2020-06-03: qty 1000

## 2020-06-03 MED ORDER — DIPHENHYDRAMINE HCL 50 MG/ML IJ SOLN
12.5000 mg | Freq: Once | INTRAMUSCULAR | Status: AC
  Administered 2020-06-03: 12.5 mg via INTRAVENOUS
  Filled 2020-06-03: qty 1

## 2020-06-03 MED ORDER — FAMOTIDINE IN NACL 20-0.9 MG/50ML-% IV SOLN
20.0000 mg | Freq: Once | INTRAVENOUS | Status: DC
  Filled 2020-06-03: qty 50

## 2020-06-03 MED ORDER — ONDANSETRON HCL 4 MG/2ML IJ SOLN
4.0000 mg | Freq: Once | INTRAMUSCULAR | Status: AC
  Administered 2020-06-03: 4 mg via INTRAVENOUS
  Filled 2020-06-03: qty 2

## 2020-06-03 NOTE — ED Provider Notes (Signed)
[facility] EMERGENCY DEPARTMENT Provider Note   CSN: [id number] Arrival date & time: 06/03/20  1034     History Chief Complaint  Patient presents with  . Myasthenia Gravis    [name] is a 29 y.o. female with a history of myasthenia gravis, Ehlers-Danlos with cervical instability, wasting syndrome, malabsorption or muscle activation disorder, polyps, GERD, IBS,, partial seizure, asthma, anxiety.  Patient presents with a chief complaint of worsening myasthenia gravis.  Patient was admitted to this hospital 11/27-12/3.  She received 3 plasmapheresis admitted as an inpatient and to as an outpatient (12/4 and 12/6).  Reports that she felt much better after completing the plasmapheresis however slowly has been worsening since then.  She states that her myasthenia gravis is well controlled."  She has been taking her Mestinon and prednisone daily without missing any doses.  At present she complains of increased weakness, left eye ptosis, difficulty speaking, fatigue and decreased sleep.  Patient reports that she has not had any difficulty with swallowing or any shortness of breath.    Patient also reports that he is supposed to have her CVL removed from her left chest today at 2 PM.  She indicates that there has been some confusion whether she needed to have this removed or not.  Per chart review the catheter needed to be removed after her last plasmapheresis.  Patient was seen in the ED on 12/9 and 12/11 for complaints of bleeding from this line.  Both times IV team was consulted and the port was found to be stable.    Also complains of to her left great toe.  Reports that during transport to the emergency department today her foot was run into a bench and bent backwards.  Currently rates the pain as a 4 out of 10 on the pain scale, reports that the pain is constant, denies any aggravating or alleviating symptoms.  Patient states that in the past she has had a high tolerance  to pain related to fractures, and often has delayed swelling.    HPI     Past Medical History:  Diagnosis Date  . Myasthenia gravis [facility])     Patient Active Problem List   Diagnosis Date Noted  . Myasthenia gravis (HCC) 05/23/2020  . Protein-calorie malnutrition, severe 05/20/2020  . Myasthenia exacerbation (HCC) 05/16/2020    History reviewed. No pertinent surgical history.   OB History   No obstetric history on file.     No family history on file.  Social History   Tobacco Use  . Smoking status: Former Games developer  . Smokeless tobacco: Never Used    Home Medications Prior to Admission medications   Medication Sig Start Date End Date Taking? Authorizing Provider  baclofen (LIORESAL) 10 MG tablet Take 10 mg by mouth See admin instructions. Take one 10 mg tablet four times a day at 6AM, 12PM, 6PM, and 12AM    [provider]  cromolyn (GASTROCROM) 100 MG/5ML solution Take 100 mg by mouth See admin instructions. 2 ampule in water taken 4 times a day before meals/bed and PRN    [provider]  cromolyn (INTAL) 20 MG/2ML nebulizer solution Take 20 mg by nebulization See admin instructions. 1 vial taken 3 times a day and PRN    [provider]  diphenhydrAMINE-zinc acetate (BENADRYL) cream Apply 1-2 application topically daily as needed (Rash from Mast Cell and Allergies).    [provider]  famotidine (PEPCID) 40 MG tablet Take 40 mg  by mouth See admin instructions. Take one 40 mg tablet twice a day at 6AM and 6PM.    [provider]  ketotifen (ZADITOR) 0.025 % ophthalmic solution Place 1 drop into both eyes daily as needed (Mast cell, Allergies, MCS/EI).    [provider]  LORazepam (ATIVAN) 0.5 MG tablet Take 0.5 mg by mouth every 6 (six) hours as needed for anxiety.    [provider]  ondansetron (ZOFRAN-ODT) 8 MG disintegrating tablet Take 8 mg by mouth every 8 (eight) hours as needed for nausea or vomiting.     [provider]  predniSONE (DELTASONE) 5 MG tablet Take 5 tablets (25 mg total) by mouth daily with breakfast. 05/22/20   Lewie Chamber, MD  pyridostigmine (MESTINON) 60 MG tablet Take 1.5 tablets (90 mg total) by mouth every 3 (three) hours. 05/21/20   Lewie Chamber, MD    Allergies    Aspirin, Nsaids, Quinolones, Sulfa antibiotics, Telithromycin, Fludrocortisone, Ambien [zolpidem], Beef-potatoes-spinach [compleat], Botulinum toxins, Chlorhexidine, Codeine, Creon [pancrelipase (lip-prot-amyl)], Cymbalta [duloxetine hcl], Dextrans, Ergotamine, Gabapentin, Gallamine, Hydrocodone, Lactose intolerance (gi), Lunesta [eszopiclone], Macrolides and ketolides, Maprotiline, Marplan [isocarboxazid], Midodrine, Morphine and related, Nitrous oxide, Oxycodone, Pethidine [meperidine], Phenergan [promethazine], Phenobarbital, Pyridium [phenazopyridine], Reglan [metoclopramide], Remeron [mirtazapine], Salicylates, Savella [milnacipran], Scopolamine, Sumatriptan, Tape, Topiramate, Tricyclic antidepressants, Tums [calcium carbonate], and Vancomycin  Review of Systems   Review of Systems  Constitutional: Positive for fatigue.  HENT: Positive for voice change. Negative for drooling and trouble swallowing.   Eyes: Positive for visual disturbance.  Respiratory: Negative for cough, choking and shortness of breath.   Cardiovascular: Negative for chest pain.  Gastrointestinal: Negative for abdominal pain and nausea.  Skin: Negative for rash.  Allergic/Immunologic: Positive for immunocompromised state.  Neurological: Positive for weakness.  Psychiatric/Behavioral: Negative for confusion.    Physical Exam Updated Vital Signs BP (!) 131/92   Pulse (!) 59   Temp 98.2 F (36.8 C) (Axillary)   Resp 18   LMP  (LMP Unknown)   SpO2 100%   Physical Exam Constitutional:      General: She is not in acute distress.    Appearance: She is underweight. She is not ill-appearing, toxic-appearing or diaphoretic.      Interventions: Cervical collar and nasal cannula in place.     Comments: Patient on 3LPM via [place], patient wears her c-collar at baseline for cervical instability   HENT:     Head: Normocephalic.  Eyes:     Comments: Ptosis to left eyelid  Cardiovascular:     Rate and Rhythm: Normal rate.  Pulmonary:     Effort: Pulmonary effort is normal. No respiratory distress.  Chest:    Musculoskeletal:     Left foot: Normal range of motion and normal capillary refill. Tenderness (great toe) and bony tenderness (great toe) present. No swelling, deformity, laceration or crepitus. Normal pulse.  Skin:    General: Skin is warm and dry.  Neurological:     Mental Status: She is alert.     ED Results / Procedures / Treatments   Labs (all labs ordered are listed, but only abnormal results are displayed) Labs Reviewed  CBC WITH DIFFERENTIAL/PLATELET - Abnormal; Notable for the following components:      Result Value   RBC 3.45 (*)    Hemoglobin 9.5 (*)    HCT 31.4 (*)    RDW 16.3 (*)    Lymphs Abs 0.3 (*)    All other components within normal limits  COMPREHENSIVE METABOLIC PANEL - Abnormal; Notable  for the following components:   Glucose, Bld 121 (*)    BUN <5 (*)    Calcium 8.7 (*)    Total Protein 5.2 (*)    All other components within normal limits  MAGNESIUM    EKG None  Radiology DG Foot 2 Views Left  Result Date: 06/03/2020 CLINICAL DATA:  Left great toe pain after injury. EXAM: LEFT FOOT - 2 VIEW COMPARISON:  None. FINDINGS: There is no evidence of fracture or dislocation. There is no evidence of arthropathy or other focal bone abnormality. Soft tissues are unremarkable. IMPRESSION: Negative. Electronically Signed   By: Lupita Raider M.D.   On: 06/03/2020 13:07    Procedures Procedures (including critical care time)  Medications Ordered in ED Medications  0.9 % NaCl with KCl 40 mEq / L  infusion (has no administration in time range)  famotidine (PEPCID) IVPB 20 mg  premix (has no administration in time range)  tetracaine 1 % injection 0.5 mL (has no administration in time range)  LORazepam (ATIVAN) injection 1 mg (1 mg Intravenous Given 06/03/20 1636)  ondansetron (ZOFRAN) injection 4 mg (4 mg Intravenous Given 06/03/20 1631)  diphenhydrAMINE (BENADRYL) injection 12.5 mg (12.5 mg Intravenous Given 06/03/20 1638)    ED Course  I have reviewed the triage vital signs and the nursing notes.  Pertinent labs & imaging results that were available during my care of the patient were reviewed by me and considered in my medical decision making (see chart for details).    MDM Rules/Calculators/A&P                          Anxious 29 year old female in no acute distress on 3LPM O2 nasal cannula and wearing a half mask respirator.  Patient reports that she uses oxygen at night and when out in public.  She reports her myasthenia gravis, endorses generalized weakness, left eye ptosis and difficulty speaking.  No shortness of breath or difficulty swallowing.  Patient is observed to speak in short sentences, there is no drooling, tolerating oral secretions.  Patient also expresses concern over having her CVL in left chest removed.  It was originally placed so patient received plasmapheresis when admitted to the hospital and then as an outpatient.  Last plasmapheresis on 12/6.  Also complains of left toe pain due to trauma she sustained during transportation today.  X-ray shows no acute fracture.  13:05 Spoke with neurologist MD S.Bhagat who is familiar with the patient.  Neurology will come to evaluate the patient.    Attempted to assess patient's SPO2 at baseline however she refused to be taken off oxygen becoming very anxious.    14:50 Informed by RN that patient is refusing central line access at this time for blood draw because the certified provider is female.  She is also refusing an IV blood draw.  Patient also refused negative inspiratory force and vital capacity due  to concerns of triggering an asthma attack due to excessive fragrance.     I spoke with the patient after this and she agreed to have a female provider do the blood draw from her catheter.  Patient used her own personal negative inspiratory force device with me present and found to be 40 and 30.    CBC showed RBC, hemoglobin, hematocrit at patient's baseline.   CMP vitals within normal limits or at patient's baseline.    Spoke to the patient with Dr. Iver Nestle.   Patient will have  her CVL removed today and will follow up with outpatient neurology.  Patient agreed.   I contacted case management who will coordinate a ride home for the patient.    She reports that she has online visit with PCP tomorrow as well as immunology.  Patient has appointment to follow-up with outpatient neurology already planned.  Discussed results, findings, treatment and follow up. Patient advised of return precautions. Patient verbalized understanding and agreed with plan. Patient was discussed with and evaluated by Dr. Rhunette CroftNanavati.       Final Clinical Impression(s) / ED Diagnoses Final diagnoses:  Anxiety    Rx / DC Orders ED Discharge Orders    None       Haskel SchroederBadalamente, Reeta Kuk R, PA-C 06/03/20 1723    Derwood KaplanNanavati, Ankit, MD 06/04/20 [phone]

## 2020-06-03 NOTE — Care Management (Signed)
ED CM arranged w/c transportation with [facility] transportation department, patient was picked up for transport home. No further TOC needs at this time.

## 2020-06-03 NOTE — ED Notes (Addendum)
PT is not on monitor for VS. Pt is in IR. VS and meds will be delayed until pt returns to ED

## 2020-06-03 NOTE — ED Notes (Signed)
Patient transported to IR 

## 2020-06-03 NOTE — ED Notes (Signed)
Patient will be outside.

## 2020-06-03 NOTE — ED Notes (Signed)
Pt refused medication and vital signs upon discharged. Pt was very incompliant to receive care.

## 2020-06-03 NOTE — Progress Notes (Signed)
Pateint refusing NIF and VC.

## 2020-06-03 NOTE — Discharge Instructions (Addendum)
You came to the emergency department to be evaluated for your myasthenia gravis, your left big toe injury and your catheter line Removal.  Your x-ray did not show any fracture in your left foot.  Your labs were very reassuring, there were no signs of infection or illness that need treatment at this time.  You were cleared by neurology to follow-up in the outpatient setting.  You had your catheter venous line removed today.  Please follow any instructions given to you by IR.  I have attached information about general wound management.  Please follow-up with your primary care provider tomorrow at your appointment.  Please return to the emergency department if you feel like your myasthenia gravis symptoms are worsening.    Get help right away if: You have a red streak going away from your wound. The edges of the wound open up and separate. Your wound is bleeding, and the bleeding does not stop with gentle pressure. You have a rash. You faint. You have trouble breathing.

## 2020-06-03 NOTE — ED Notes (Addendum)
Pt is noncompliant with medical care. Pt continues to pull off leads and O2 cords. Pt refuses labs PA aware.

## 2020-06-03 NOTE — ED Triage Notes (Signed)
Pt reports worsening MG, supposed to have central line removed today 2pm. Pt also wants her toe evaluated, thinks she broke it during transit here. Pt alert.

## 2020-06-03 NOTE — ED Notes (Addendum)
Went in to check on pt to see what I could help her with. Pt is tearful and wrote on her notebook. When I asked her if she was on oxygen all the time. She yanked the notebook from my hand before I was finished reading it. Pt uncooperative with care. Was able to convince pt to let me put pulse oximeter on finger instead of toe to try and get a better reading. Pt had hooked herself up to her O2 tank which was empty. I placed her back on 2L [place] hooked up to the wall. Also noted that pt had removed BP cuff. Pt refusing labs at this time. Pt unwilling to cooperate with care. Provider at bedside and aware.

## 2020-06-04 ENCOUNTER — Telehealth (INDEPENDENT_AMBULATORY_CARE_PROVIDER_SITE_OTHER): Payer: 59 | Admitting: Primary Care

## 2020-06-04 DIAGNOSIS — G7000 Myasthenia gravis without (acute) exacerbation: Secondary | ICD-10-CM

## 2020-06-04 NOTE — Progress Notes (Signed)
Pt saw immunology today at [facility] line was removed yesterday in the ED

## 2020-06-09 ENCOUNTER — Telehealth: Payer: Self-pay

## 2020-06-09 NOTE — Telephone Encounter (Signed)
Call placed to the patient and scheduled her an appointment with Dr Laural Benes 06/26/2020.  She is requesting to speak with the SW who could help refer her to a therapist.  Informed her that Jenel Lucks, LCSW is the clinic SW and could contact her.  The patient was very appreciative and said she is anxious to speak to [place].

## 2020-06-11 ENCOUNTER — Telehealth: Payer: Self-pay | Admitting: Licensed Clinical Social Worker

## 2020-06-11 ENCOUNTER — Ambulatory Visit: Payer: 59 | Admitting: Neurology

## 2020-06-11 NOTE — Telephone Encounter (Signed)
Call placed to patient. LCSW introduced self and explained role at [facility]. Pt was informed of message provided by RN CM, Robyne Peers, to follow up, per pt request.   Pt shared that she could benefit from supportive services to assist with management of chronic medical conditions. Pt reports that while transferring insurance (Medicare/Medicaid) from [facility] to [place], an error in the forwarding address was identified resulting in a lapse of coverage. This should be rectified by January 1, 22 and coverage will be retroactive. Pt is concerned about current IV fluid orders expiring, prior to 06/20/20 stating that she requires this service daily. Pt is also in need of sterile water to assist with G tube.   Pt requests referral to psychiatrist and therapist with knowledge in complex trauma, preferably in EMDR. She was also recommended to have a psychological assessment completed to evaluate for Autism, by a specialist in [place].   Pt requests assistance with pending orders from the hospital to assist with Occupational Therapy, Physical Therapy, Speech Therapy, Personal Care Services (PCS), and in home Social Work. States that she may need to contact [facility], a previous home care provider, to obtain information regarding a bath list (per pt, blood pools in legs while utilizing a shower chair)   LCSW informed patient of benefits of signing up with MyChart to improve access to providers and to assist with reminders of appointments. An updated link for registration was sent via text to number on file. Pt agreed to upload medical records for PCP to review.   LCSW agreed to follow up with RN CM regarding pt's concerns. Pt verbalized understanding and was appreciative for the follow up call.

## 2020-06-14 NOTE — Progress Notes (Signed)
Virtual Visit via Telephone Note  I connected with [name] on 06/14/20 at  2:30 PM EST by telephone and verified that I am speaking with the correct person using two identifiers.  Location: Patient: home Provider: Grayce Sessions @[facility]    I discussed the limitations, risks, security and privacy concerns of performing an evaluation and management service by telephone and the availability of in person appointments. I also discussed with the patient that there may be a patient responsible charge related to this service. The patient expressed understanding and agreed to proceed.   History of Present Illness: [name] has a complex medical history including myasthenia gravis (MG), hypermobile Ehlers-Danlos with cervical instability (intermittently wears C-collar), dysautonomia, POTS, severe protein malnourishment, malabsorption with recent G-tube converted to G-J tube, GERD with hiatal hernia, IBS with diarrhea, asthma, multiple chemical sensitivity syndrome, seasonal/environmental allergies, chronic migraines, PTSD, and fibromyalgia. She has previously been followed by multiple specialists [place] of [place] in [name] is from  including neurology, gastroenterology.After an extensive conversation with all her co morbi ties. Explain the complexity of her diagnosis and care she would be better managed by a physician. I would relay this information to my supervising physician and clinical nurse to try to assist in getting equipment needed. Note Medicaid is still under [place]    Observations/Objective: Defer   Assessment and Plan: deferred   Follow Up Instructions:    I discussed the assessment and treatment plan with the patient. The patient was provided an opportunity to ask questions and all were answered. The patient agreed with the plan and demonstrated an understanding of the instructions.   The patient was advised to call back or seek an in-person evaluation if the symptoms  worsen or if the condition fails to improve as anticipated.  I provided 30 minutes of non-face-to-face time during this encounter.   [place], NP

## 2020-06-18 ENCOUNTER — Telehealth: Payer: Self-pay | Admitting: General Practice

## 2020-06-18 NOTE — Telephone Encounter (Signed)
Copied from CRM [id number]. Topic: General - Other >> Jun 18, 2020  4:20 PM Gwenlyn Fudge wrote: Reason for CRM: Tresa Endo, case manager at [facility], called and is requesting to speak with Erskine Squibb. She states that she is needing to discuss getting services for pt. Please advise.

## 2020-06-23 NOTE — Telephone Encounter (Signed)
Call placed to [facility], RN CM and explained to her that patient has a virtual appointment with Dr Laural Benes this week and her needs will be assessed at that time.  She may need to schedule another appointment in person as she has multiple requests for care/services.

## 2020-06-25 ENCOUNTER — Ambulatory Visit (INDEPENDENT_AMBULATORY_CARE_PROVIDER_SITE_OTHER): Payer: Medicare Other | Admitting: Family Medicine

## 2020-06-25 ENCOUNTER — Other Ambulatory Visit: Payer: Self-pay

## 2020-06-25 DIAGNOSIS — E43 Unspecified severe protein-calorie malnutrition: Secondary | ICD-10-CM

## 2020-06-25 DIAGNOSIS — Z713 Dietary counseling and surveillance: Secondary | ICD-10-CM | POA: Diagnosis not present

## 2020-06-25 NOTE — Patient Instructions (Addendum)
-   Please send dosages of medications and nutritional supplements you are taking.   Dietary Recommendations - You will have to eat beyond discomfort at times.  Always ask yourself, "Can I take one more bite?"  Aim for getting some foods by mouth every day, using tube feeding as needed to add to your intake.     - Many of your medical problems will be helped by better (adequate) nutrition.     - Be as consistent as possible with respect to eating times each day.  At scheduled eating times, even when you don't feel like eating, can you take one bite?    - Plan tomorrow's first foods of the day, and do whatever prep you can do the night before.    - Start eating as early in the day as possible.  You may have best success with a cup of hot broth or tea (if you can find one that is well tolerated) first thing in the morning to help get your GI system functioning.     - Consume small, frequent meals throughout the day, being as upright as possible after eating.  Aim for eating something no more than 4 hours apart, even if it is a small portion, e.g., 7 AM, 10 AM, 2 PM, 5 PM, 8 PM.    Follow-up video appointment times available:  - Tuesday, 2/1 at 11 AM or 1:30 PM - Thursday, 2/3 at 1:30 PM or 2:30 PM  Please let me know a time that works for you.

## 2020-06-25 NOTE — Progress Notes (Signed)
Telehealth Encounter (TEXT link [phone] ) PCP Karle Plumber, MD I connected with Brandy Brandy (MRN [id number]) on 06/25/2020 by MyChart video-enabled, HIPAA-compliant telemedicine application, verified that I was speaking with the correct person using two identifiers, and that the patient was in a private environment conducive to confidentiality.  The patient agreed to proceed.   Persons participating in visit were patient and provider (registered dietitian) Kennith Center, PhD, Brandy, LDN, CEDRD. Provider was located at home during this telehealth encounter; patient was at home.  Appt start time: 1430 end time: 1550 (80 minutes)  Reason for telehealth visit: Referred upon hospital discharge by Brandy Brandy for Medical Nutrition Therapy related to food sensitivities and eating behavior concerns.  Relevant history/background: Medical hx includes:  . Asthma  . Cervical spine instability  . Chronic PTSD  . Dysautonomia    . Fibromyalgia  . GERD with hiatal hernia  . Hypermobile Ehlers-Danlos syndrome  . IBS (irritable bowel syndrome)  . Malabsorption  . 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 Myasthenia gravis . POTS (postural orthostatic tachycardia syndrome)  . Seasonal allergies  . Severe protein-calorie malnutrition  Cardiologist Harriette Bouillon, MD provided a provisional dx on 06/14/20 of dysautonomia probably secondary to neurocardiogenic syncope vs. POTS.  For a comprehensive list of symptoms, see Dr. Gwenyth Bender encounter notes from 06/16/20.  Symptoms most relevant to nutrition concerns include nausea, diarrhea, constipation, heart burn, abdominal bloating and cramping, feeling full quickly after eating, difficulty swallowing, weight loss, and bad breath.  Due to fatigue, light-headedness, and syncope, patient is wheelchair-bound.  Brandy Brandy reports numerous food allergies/intolerances in addition to the 42 drug-related allergies  documented in EMR.   Brandy Brandy during her hospitalization in November 2021.  Nutrition concerns at that time included severe malnutrition, confirmed by labs showing low K+, Ca+, BUN, total protein, alk phos, AST, hgb, hct, ferritin, RBC, vit's A and D, and Cu.  WNR were vit's B6, B12, folate, thiamin, vit C, and Zn.  Brandy Brandy in place a J-tube, but used primarily a Brandy Brandy-tube for tube feeding while hospitalized, which Brandy continues at home.  Brandy rejected all tube feeding options on the hospital's formulary (including [employer] [place]), using instead her own TF blend, which typically includes celery, pears, rutabaga, rice, lentils, and water of which Brandy gets at most 137m /day.  Brandy consumes PO a limited range of foods such as dried beans made from scratch, homemade cassava root chips, brussels sprouts, rice crisps, rice pasta, and steamed cabbage.    Assessment: Brandy Brandy.  According to he has gained ~8 lb  weight since hospital discharge in early December.  Food remains unenjoyable, however, and needs Brandy identified are some education about and oversight of tube feeding (Brandy Brandy-J tube), and Brandy wants a food plan to guide her through the challenges of swallowing problems as well as food intolerances.  Sometimes cannot tolerate PO foods due mainly to flare-up of myasthenia gravis and inflammation (from MCAS). MG has been bad the past 7-10 days.  Feels Brandy tolerates ionized water best.  Consumes 36 oz to more than a gallon water per day, and 1 tbsp salt 2-3 X day.   Eight (self-report) on 06/23/20: 106 lb; 64".   Usual eating pattern: Eating frequency by tube or PO is 1 to 4 X day.  Estimates Brandy gets 60-70% blended foods given via Brandy Brandy-J tube  and PO foods are mostly rice and rice pasta, with soft-cooked cabbage, sauteed or steamed Br sprouts.  Homemade cassava tortillas are main "bread," but sometimes has GF vegan Eng muffin, which sometimes causes  heartburn, abd cramps, throat swelling, constipa, migraine H/A.   Frequent foods and beverages: blended lentils or beans, long-grain, jasmine  rice, steamed Br sprouts, homemade cassava tortillas, broths with limited portions of rice, rice pasta.  Can tolerate only certain brands of each of above.   Avoided foods/substances: nightshades, salicylates, citrus, dyes, artificial flavors, dairy foods, most meats (but would like to find fresh low-histamine meat, quail, chicken eggs, fish/seafood.  Only fruit Brandy eats are peeled, cooked Bartlet pears, cooked blueberries, homemade applesauce, cooked apple.   Usual physical activity: limited to care of almost-8-YO son and ADLs.   Sleep: Estimates average of ~5 hours of sleep/night.  Takes medication every 3 hours per 24 hrs.   24-hr recall:  (Up at 7:30 AM; very symptomatic in AM) B ( AM)-  PO: water w/ med's  Snk ( AM)-  PO: water w/ med's L (1 PM)-  PO: 1 c boiled cabbage, 1 c rice noodles, 1/4 8"-diameter cassava tortilla (took Zofran) Snk (5:30)-  PO: 1 c rice, 3/4  c beans, 1/2 c rutabaga  D ( PM)-  PO:  Snk (10 PM)-  6"-diam cassava tortilla, ~2 tbsp cooked mung bean sprouts, ~1/2 c rice, 1/2 leaf iceberg lettuce Typical day? Yes.   However, intake is fluctuating a lot between proportion of pureed/tube feeding and PO eating.    Intervention: Completed diet and exercise history, and established behavioral goals.   For recommendations and goals, see Patient Instructions.    Follow-up: Telehealth visit in 4 weeks.   Chenelle Benning,JEANNIE

## 2020-06-26 ENCOUNTER — Ambulatory Visit: Payer: Medicare Other | Attending: Internal Medicine | Admitting: Internal Medicine

## 2020-06-26 ENCOUNTER — Telehealth: Payer: Self-pay

## 2020-06-26 ENCOUNTER — Encounter: Payer: Self-pay | Admitting: Internal Medicine

## 2020-06-26 ENCOUNTER — Other Ambulatory Visit: Payer: Self-pay

## 2020-06-26 DIAGNOSIS — Q796 Ehlers-Danlos syndrome, unspecified: Secondary | ICD-10-CM

## 2020-06-26 DIAGNOSIS — F4310 Post-traumatic stress disorder, unspecified: Secondary | ICD-10-CM

## 2020-06-26 DIAGNOSIS — T7840XA Allergy, unspecified, initial encounter: Secondary | ICD-10-CM

## 2020-06-26 DIAGNOSIS — Z8659 Personal history of other mental and behavioral disorders: Secondary | ICD-10-CM

## 2020-06-26 DIAGNOSIS — L501 Idiopathic urticaria: Secondary | ICD-10-CM

## 2020-06-26 DIAGNOSIS — F411 Generalized anxiety disorder: Secondary | ICD-10-CM

## 2020-06-26 DIAGNOSIS — Z713 Dietary counseling and surveillance: Secondary | ICD-10-CM

## 2020-06-26 DIAGNOSIS — E876 Hypokalemia: Secondary | ICD-10-CM

## 2020-06-26 DIAGNOSIS — Z9981 Dependence on supplemental oxygen: Secondary | ICD-10-CM

## 2020-06-26 DIAGNOSIS — D8940 Mast cell activation, unspecified: Secondary | ICD-10-CM

## 2020-06-26 DIAGNOSIS — M532X2 Spinal instabilities, cervical region: Secondary | ICD-10-CM

## 2020-06-26 DIAGNOSIS — Z978 Presence of other specified devices: Secondary | ICD-10-CM

## 2020-06-26 DIAGNOSIS — G7000 Myasthenia gravis without (acute) exacerbation: Secondary | ICD-10-CM | POA: Diagnosis not present

## 2020-06-26 DIAGNOSIS — I498 Other specified cardiac arrhythmias: Secondary | ICD-10-CM

## 2020-06-26 DIAGNOSIS — M797 Fibromyalgia: Secondary | ICD-10-CM

## 2020-06-26 DIAGNOSIS — G901 Familial dysautonomia [Riley-Day]: Secondary | ICD-10-CM

## 2020-06-26 DIAGNOSIS — D508 Other iron deficiency anemias: Secondary | ICD-10-CM

## 2020-06-26 DIAGNOSIS — E46 Unspecified protein-calorie malnutrition: Secondary | ICD-10-CM | POA: Diagnosis not present

## 2020-06-26 DIAGNOSIS — G90A Postural orthostatic tachycardia syndrome (POTS): Secondary | ICD-10-CM

## 2020-06-26 NOTE — Telephone Encounter (Signed)
At request of Dr Laural Benes, call placed to patient with Brandy Lucks, LCSW and Brandy [name], CMA present to discuss follow up plan of care.  Patient has multiple complex  medical issues that need to be addressed and require the collaboration of multiple specialists. Her needs include  home health services, personal care services, DME, O2 , infusion, nutrition/tube feedings, adaptations to her home.  The need for a tertiary care center to best coordinate her complex care was discussed and she stated that a tertiary care center has  been recommended in the past. She has been struggling to coordinate her own care since arriving in [place].   She has an appointment with Brandy Shay, PA/[facility] scheduled for 07/15/2020 however she is not sure that she will be accepted at that practice.  She has already has been seen by specialists from [facility] and [facility] systems.   Explained to her that we will discuss her needs with Dr Alvis Lemmings, our medical director, as well as Dr Laural Benes and will contact her next week to discuss recommendations for care going forward.

## 2020-06-26 NOTE — Progress Notes (Signed)
Pt states she is having pain in her neck, back, b/l shoulder, left ankle, b/l knee, abdomen, hip, neck and throat
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
## 2020-06-26 NOTE — Telephone Encounter (Signed)
I do agree that she needs a higher level of care due to the complexity of her medical conditions and will be better served at a tertiary center as her specialist are there. Dr Laural Benes will place a referral for [facility] services but I would advise her to keep her upcoming appointment with [facility] to establish care.

## 2020-06-28 ENCOUNTER — Other Ambulatory Visit: Payer: Self-pay | Admitting: Internal Medicine

## 2020-06-28 DIAGNOSIS — M797 Fibromyalgia: Secondary | ICD-10-CM | POA: Insufficient documentation

## 2020-06-28 DIAGNOSIS — L501 Idiopathic urticaria: Secondary | ICD-10-CM | POA: Insufficient documentation

## 2020-06-28 DIAGNOSIS — G90A Postural orthostatic tachycardia syndrome (POTS): Secondary | ICD-10-CM | POA: Insufficient documentation

## 2020-06-28 DIAGNOSIS — D8940 Mast cell activation, unspecified: Secondary | ICD-10-CM

## 2020-06-28 DIAGNOSIS — M532X2 Spinal instabilities, cervical region: Secondary | ICD-10-CM | POA: Insufficient documentation

## 2020-06-28 DIAGNOSIS — Z9981 Dependence on supplemental oxygen: Secondary | ICD-10-CM | POA: Insufficient documentation

## 2020-06-28 DIAGNOSIS — Z713 Dietary counseling and surveillance: Secondary | ICD-10-CM | POA: Insufficient documentation

## 2020-06-28 DIAGNOSIS — F4310 Post-traumatic stress disorder, unspecified: Secondary | ICD-10-CM | POA: Insufficient documentation

## 2020-06-28 DIAGNOSIS — G901 Familial dysautonomia [Riley-Day]: Secondary | ICD-10-CM | POA: Insufficient documentation

## 2020-06-28 DIAGNOSIS — I498 Other specified cardiac arrhythmias: Secondary | ICD-10-CM

## 2020-06-28 DIAGNOSIS — F411 Generalized anxiety disorder: Secondary | ICD-10-CM | POA: Insufficient documentation

## 2020-06-28 DIAGNOSIS — D649 Anemia, unspecified: Secondary | ICD-10-CM | POA: Insufficient documentation

## 2020-06-28 DIAGNOSIS — Z978 Presence of other specified devices: Secondary | ICD-10-CM | POA: Insufficient documentation

## 2020-06-28 DIAGNOSIS — Q796 Ehlers-Danlos syndrome, unspecified: Secondary | ICD-10-CM

## 2020-06-28 DIAGNOSIS — Z8659 Personal history of other mental and behavioral disorders: Secondary | ICD-10-CM | POA: Insufficient documentation

## 2020-06-28 DIAGNOSIS — T7840XA Allergy, unspecified, initial encounter: Secondary | ICD-10-CM | POA: Insufficient documentation

## 2020-06-28 HISTORY — DX: Other specified cardiac arrhythmias: I49.8

## 2020-06-28 HISTORY — DX: Postural orthostatic tachycardia syndrome (POTS): G90.A

## 2020-06-28 HISTORY — DX: Mast cell activation, unspecified: D89.40

## 2020-06-28 HISTORY — DX: Familial dysautonomia (riley-day): G90.1

## 2020-06-28 HISTORY — DX: Ehlers-Danlos syndrome, unspecified: Q79.60

## 2020-06-29 ENCOUNTER — Telehealth: Payer: Self-pay | Admitting: Internal Medicine

## 2020-06-29 ENCOUNTER — Ambulatory Visit: Payer: Self-pay | Admitting: *Deleted

## 2020-06-29 NOTE — Telephone Encounter (Signed)
Patient requested to speak with the social worker at [employer]. Christina to transfer to that line.

## 2020-06-29 NOTE — Telephone Encounter (Signed)
Copied from CRM [id number]. Topic: General - Other >> Jun 29, 2020  9:34 AM Lyn Hollingshead D wrote: Pt need to speak with the Social Worker about her medical condition, she's crying an very frustrated, please advise

## 2020-06-29 NOTE — Telephone Encounter (Signed)
Call placed to the patient to inform her that Dr Laural Benes will be placing a referral for PCS. She requested this CM contact Fabio Neighbors, RN CM at [facility] because she thinks this may already have been done.    This CM explained to the patient that her needs were discussed with our medical director, Dr Alvis Lemmings, who along with Dr Laural Benes recommend that she would best be served at a tertiary care center due to the complexity of her medical conditions and she already had appointments with specialists from  [facility] and [facility]. She said that she understood that from speaking with Dr Laural Benes that she would need to find another practice to establish care.  This CM encouraged her to keep her appointment with [facility] on 07/15/2020.  The patient stated that office already called her cancelled the appointment.  She explained that they instructed her to call [facility] in [place].  She then called that practice and they told her that she should seek care from a practice that is closer to where she lives and they provided her with information about another [facility] clinic in [place]. She then said that she has an appointment 07/2020 at a [facility] clinic in [place].  She hopes that she will be accepted there and she will be able to maintain her health/care needs until then. She then said that what she really needs is a bath lift and she had worked with OT in hopes to obtain this but she was not able to get it prior to moving to [place].  This CM encouraged her to contact her insurance company about case management and assistance with coordinating her care.  She said she has contacted them and is waiting for someone to contact her back.    This CM spoke to Fabio Neighbors, RN CM who said that a [facility] request has already been submitted.

## 2020-06-30 ENCOUNTER — Telehealth: Payer: Self-pay

## 2020-06-30 NOTE — Telephone Encounter (Signed)
Iona Coach, RN CM provided number for [facility] # [phone]  who is providing patient with home infusion services.  Call placed to [facility], spoke to [place]  regarding drawing patient's labs. She stated that an order to draw potassium can be placed in Epic as " Other Orders" or the order can be faxed to # [phone] and their nurse will draw the labs.  The results will be faxed to [facility] as well as [facility]

## 2020-06-30 NOTE — Addendum Note (Signed)
Addended by: Jonah Blue B on: 06/30/2020 05:51 PM   Modules accepted: Orders

## 2020-07-08 ENCOUNTER — Telehealth: Payer: Self-pay | Admitting: Neurology

## 2020-07-08 ENCOUNTER — Telehealth: Payer: Self-pay | Admitting: Internal Medicine

## 2020-07-08 NOTE — Telephone Encounter (Signed)
Patient was advised we could not offer her a virtual visit as a new patient. She would need to come into the office to be examined. She was offered to reschedule her appointment. She hung up before confirming if she would be attending her 07/09/20 appointment. We will not cancel it in case she comes in and will be happy to reschedule her for another time when she could come into the office.

## 2020-07-08 NOTE — Telephone Encounter (Signed)
Patient upset because she insisted on having a virtual new patient appointment for Myasthenia Gravis. I explained to patient that we do not offer virtual appointments for new patient visits. Patient was upset and asked if we would refer her somewhere else where she could do virtual visits. I explained to patient that she has not establish care in our office therefore we would not be able to refer her elsewhere for treatment. She was referred to our office via the ER. I informed her that she should reach out to her PCP and ask her PCP to refer her however she states that she doesn't have a PCP at this time. I apologized to patient for any inconvenience however we would be happy to see her as an in person new patient visit. Patient hung up before I could confirm if she wanted to keep her appointment for 07/09/20.

## 2020-07-08 NOTE — Telephone Encounter (Signed)
Pt called and was very upset and crying-she wanted her referral to be sent to [facility] and [facility] would not send it as they advised it had to be sent from the referring doctor. Advised patient that we would need a referral from her primary care or she could reach out to the referring provider to see if they would send one to [place]. She has an appointment tomorrow with [facility] and patient stated she already cancelled her ride and doesn't have childcare available. Advised her to call them to reschedule and they would work with her.

## 2020-07-09 ENCOUNTER — Ambulatory Visit: Payer: 59 | Admitting: Neurology

## 2020-07-09 ENCOUNTER — Other Ambulatory Visit: Payer: Self-pay

## 2020-07-09 ENCOUNTER — Emergency Department (HOSPITAL_COMMUNITY): Payer: Medicare Other

## 2020-07-09 ENCOUNTER — Encounter (HOSPITAL_COMMUNITY): Payer: Self-pay | Admitting: Emergency Medicine

## 2020-07-09 ENCOUNTER — Observation Stay (HOSPITAL_COMMUNITY)
Admission: EM | Admit: 2020-07-09 | Discharge: 2020-07-11 | Disposition: A | Discharge status: Home / Self Care | Payer: Medicare Other | Attending: Family Medicine | Admitting: Family Medicine

## 2020-07-09 ENCOUNTER — Ambulatory Visit (HOSPITAL_COMMUNITY)
Admission: EM | Admit: 2020-07-09 | Discharge: 2020-07-09 | Disposition: A | Discharge status: Home / Self Care | Payer: Medicare Other

## 2020-07-09 DIAGNOSIS — G7000 Myasthenia gravis without (acute) exacerbation: Secondary | ICD-10-CM | POA: Insufficient documentation

## 2020-07-09 DIAGNOSIS — M532X2 Spinal instabilities, cervical region: Secondary | ICD-10-CM | POA: Diagnosis not present

## 2020-07-09 DIAGNOSIS — F4310 Post-traumatic stress disorder, unspecified: Secondary | ICD-10-CM | POA: Diagnosis not present

## 2020-07-09 DIAGNOSIS — Z882 Allergy status to sulfonamides status: Secondary | ICD-10-CM | POA: Insufficient documentation

## 2020-07-09 DIAGNOSIS — Z885 Allergy status to narcotic agent status: Secondary | ICD-10-CM | POA: Diagnosis not present

## 2020-07-09 DIAGNOSIS — N200 Calculus of kidney: Secondary | ICD-10-CM | POA: Diagnosis not present

## 2020-07-09 DIAGNOSIS — Z888 Allergy status to other drugs, medicaments and biological substances status: Secondary | ICD-10-CM | POA: Diagnosis not present

## 2020-07-09 DIAGNOSIS — Z79899 Other long term (current) drug therapy: Secondary | ICD-10-CM | POA: Diagnosis not present

## 2020-07-09 DIAGNOSIS — R2689 Other abnormalities of gait and mobility: Secondary | ICD-10-CM | POA: Insufficient documentation

## 2020-07-09 DIAGNOSIS — D649 Anemia, unspecified: Secondary | ICD-10-CM | POA: Insufficient documentation

## 2020-07-09 DIAGNOSIS — E46 Unspecified protein-calorie malnutrition: Secondary | ICD-10-CM

## 2020-07-09 DIAGNOSIS — Z886 Allergy status to analgesic agent status: Secondary | ICD-10-CM | POA: Diagnosis not present

## 2020-07-09 DIAGNOSIS — G901 Familial dysautonomia [Riley-Day]: Secondary | ICD-10-CM | POA: Insufficient documentation

## 2020-07-09 DIAGNOSIS — M6281 Muscle weakness (generalized): Secondary | ICD-10-CM | POA: Insufficient documentation

## 2020-07-09 DIAGNOSIS — Z931 Gastrostomy status: Secondary | ICD-10-CM

## 2020-07-09 DIAGNOSIS — K9423 Gastrostomy malfunction: Principal | ICD-10-CM | POA: Insufficient documentation

## 2020-07-09 DIAGNOSIS — E876 Hypokalemia: Secondary | ICD-10-CM | POA: Insufficient documentation

## 2020-07-09 DIAGNOSIS — E43 Unspecified severe protein-calorie malnutrition: Secondary | ICD-10-CM | POA: Insufficient documentation

## 2020-07-09 DIAGNOSIS — Z681 Body mass index (BMI) 19 or less, adult: Secondary | ICD-10-CM | POA: Diagnosis not present

## 2020-07-09 DIAGNOSIS — D8940 Mast cell activation, unspecified: Secondary | ICD-10-CM | POA: Insufficient documentation

## 2020-07-09 DIAGNOSIS — Z7951 Long term (current) use of inhaled steroids: Secondary | ICD-10-CM | POA: Diagnosis not present

## 2020-07-09 DIAGNOSIS — Z20822 Contact with and (suspected) exposure to covid-19: Secondary | ICD-10-CM | POA: Diagnosis not present

## 2020-07-09 DIAGNOSIS — R531 Weakness: Secondary | ICD-10-CM

## 2020-07-09 DIAGNOSIS — Z881 Allergy status to other antibiotic agents status: Secondary | ICD-10-CM | POA: Insufficient documentation

## 2020-07-09 DIAGNOSIS — Z87891 Personal history of nicotine dependence: Secondary | ICD-10-CM | POA: Insufficient documentation

## 2020-07-09 DIAGNOSIS — T85528A Displacement of other gastrointestinal prosthetic devices, implants and grafts, initial encounter: Secondary | ICD-10-CM

## 2020-07-09 DIAGNOSIS — T85598A Other mechanical complication of other gastrointestinal prosthetic devices, implants and grafts, initial encounter: Secondary | ICD-10-CM

## 2020-07-09 DIAGNOSIS — G7001 Myasthenia gravis with (acute) exacerbation: Secondary | ICD-10-CM

## 2020-07-09 LAB — CBC WITH DIFFERENTIAL/PLATELET
Abs Immature Granulocytes: 0.03 10*3/uL (ref 0.00–0.07)
Basophils Absolute: 0 10*3/uL (ref 0.0–0.1)
Basophils Relative: 0 %
Eosinophils Absolute: 0 10*3/uL (ref 0.0–0.5)
Eosinophils Relative: 0 %
HCT: 29.8 % — ABNORMAL LOW (ref 36.0–46.0)
Hemoglobin: 9.3 g/dL — ABNORMAL LOW (ref 12.0–15.0)
Immature Granulocytes: 1 %
Lymphocytes Relative: 6 %
Lymphs Abs: 0.4 10*3/uL — ABNORMAL LOW (ref 0.7–4.0)
MCH: 27.3 pg (ref 26.0–34.0)
MCHC: 31.2 g/dL (ref 30.0–36.0)
MCV: 87.4 fL (ref 80.0–100.0)
Monocytes Absolute: 0.5 10*3/uL (ref 0.1–1.0)
Monocytes Relative: 8 %
Neutro Abs: 5.3 10*3/uL (ref 1.7–7.7)
Neutrophils Relative %: 85 %
Platelets: 256 10*3/uL (ref 150–400)
RBC: 3.41 MIL/uL — ABNORMAL LOW (ref 3.87–5.11)
RDW: 13.9 % (ref 11.5–15.5)
WBC: 6.2 10*3/uL (ref 4.0–10.5)
nRBC: 0 % (ref 0.0–0.2)

## 2020-07-09 LAB — POC URINE PREG, ED: Preg Test, Ur: NEGATIVE

## 2020-07-09 LAB — LACTIC ACID, PLASMA: Lactic Acid, Venous: 0.8 mmol/L (ref 0.5–1.9)

## 2020-07-09 LAB — COMPREHENSIVE METABOLIC PANEL
ALT: 15 U/L (ref 0–44)
AST: 14 U/L — ABNORMAL LOW (ref 15–41)
Albumin: 3.1 g/dL — ABNORMAL LOW (ref 3.5–5.0)
Alkaline Phosphatase: 56 U/L (ref 38–126)
Anion gap: 10 (ref 5–15)
BUN: <5 mg/dL — ABNORMAL LOW (ref 6–20)
CO2: 26 mmol/L (ref 22–32)
Calcium: 8.6 mg/dL — ABNORMAL LOW (ref 8.9–10.3)
Chloride: 104 mmol/L (ref 98–111)
Creatinine, Ser: 0.59 mg/dL (ref 0.44–1.00)
GFR, Estimated: >60 mL/min (ref >60)
Glucose, Bld: 98 mg/dL (ref 70–99)
Potassium: 3.3 mmol/L — ABNORMAL LOW (ref 3.5–5.1)
Sodium: 140 mmol/L (ref 135–145)
Total Bilirubin: 0.8 mg/dL (ref 0.3–1.2)
Total Protein: 5.4 g/dL — ABNORMAL LOW (ref 6.5–8.1)

## 2020-07-09 LAB — URINALYSIS, ROUTINE W REFLEX MICROSCOPIC
Bilirubin Urine: NEGATIVE
Glucose, UA: NEGATIVE mg/dL
Hgb urine dipstick: NEGATIVE
Ketones, ur: NEGATIVE mg/dL
Leukocytes,Ua: NEGATIVE
Nitrite: NEGATIVE
Protein, ur: NEGATIVE mg/dL
Specific Gravity, Urine: 1.006 (ref 1.005–1.030)
pH: 9 — ABNORMAL HIGH (ref 5.0–8.0)

## 2020-07-09 LAB — TSH: TSH: 0.365 u[IU]/mL (ref 0.350–4.500)

## 2020-07-09 LAB — PHOSPHORUS: Phosphorus: 4 mg/dL (ref 2.5–4.6)

## 2020-07-09 LAB — MAGNESIUM: Magnesium: 2.1 mg/dL (ref 1.7–2.4)

## 2020-07-09 LAB — I-STAT BETA HCG BLOOD, ED (MC, WL, AP ONLY): I-stat hCG, quantitative: <5 m[IU]/mL

## 2020-07-09 LAB — SARS CORONAVIRUS 2 BY RT PCR (HOSPITAL ORDER, PERFORMED IN ~~LOC~~ HOSPITAL LAB): SARS Coronavirus 2: NEGATIVE

## 2020-07-09 LAB — PREGNANCY, URINE: Preg Test, Ur: NEGATIVE

## 2020-07-09 MED ORDER — SODIUM CHLORIDE 0.9% FLUSH
10.0000 mL | Freq: Two times a day (BID) | INTRAVENOUS | Status: DC
  Administered 2020-07-09: 20 mL
  Administered 2020-07-10: 10 mL

## 2020-07-09 MED ORDER — SODIUM CHLORIDE 0.9% FLUSH: 10.0000 mL | INTRAVENOUS | Status: DC | PRN

## 2020-07-09 MED ORDER — SODIUM CHLORIDE 0.9 % IV SOLN: Freq: Once | INTRAVENOUS | Status: AC

## 2020-07-09 NOTE — ED Triage Notes (Signed)
Patient BIB [facility] concerned that her g-tube is infected. Patient arrives wearing large respirator and requested 2L [place] that she states she needs when she is wearing the respirator.  EMS Vitals 124/84 HR 66 SpO2 100% on room air
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
## 2020-07-09 NOTE — Progress Notes (Signed)
RT had pt perform NIF x 3, pt had good effort, best of three was -30. RT will continue to monitor.

## 2020-07-09 NOTE — ED Notes (Signed)
Spoke with provider Dr. Tracie Harrier who adivsed patient be seen in ED per her GI's request.  Explained to patient she need a higher level of care.  Patient and caregiver agrees and will go to ED for treatment.

## 2020-07-09 NOTE — ED Notes (Signed)
While helping EMS transfer pt to recliner pt attempted to throw herself on the floor. Pt refused for this tech to obtain vitals.

## 2020-07-09 NOTE — ED Notes (Signed)
Pt placed on bedpan

## 2020-07-09 NOTE — ED Notes (Signed)
Patient states she is having difficulty breathing while wearing respirator she brought from home. Offered patient standard mask, patient refused. SpO2 while wearing respirator 100% on room air.

## 2020-07-09 NOTE — ED Provider Notes (Signed)
[facility]   CSN: [id number] Arrival date & time: 07/09/20  1706     History Chief Complaint  Patient presents with  . Wound Infection    Brandy [name] is a 30 y.o. female.  Patient here for 2 issues. She is concerned about her feeding tube and that it may be out of place. She has some drainage around the feeding tube site and concern for possible infection. She is also having issues with her myasthenia gravis. Having bad symptoms still despite being on prednisone and Mestinon. Having difficulty talking due to difficulty swallowing and mostly right in her history. She was able to take her Mestinon and symptoms improved and she was able to have a long discussion with me in neurology about her symptoms. See the MDM for further information.  The history is provided by the patient.  Illness Location:  Abdomen Severity:  Mild Onset quality:  Gradual Timing:  Constant Progression:  Unchanged Chronicity:  New Relieved by:  Nothing Worsened by:  Activity  Associated symptoms: abdominal pain (intermittent), fatigue and shortness of breath   Associated symptoms: no chest pain, no cough, no diarrhea, no ear pain, no fever, no nausea, no rash, no sore throat and no vomiting        Past Medical History:  Diagnosis Date  . Myasthenia gravis [facility](HCC)     Patient Active Problem List   Diagnosis Date Noted  . 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 Ehlers-Danlos syndrome 06/28/2020  . Uses feeding tube 06/28/2020  . Restricted diet 06/28/2020  . 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 Chronic idiopathic urticaria 06/28/2020  . POTS (postural orthostatic tachycardia syndrome) 06/28/2020  . Dysautonomia (HCC) 06/28/2020  . PTSD (post-traumatic stress disorder) 06/28/2020  . GAD (generalized anxiety disorder) 06/28/2020  . History of major depression 06/28/2020  .  Absolute anemia 06/28/2020  . On home oxygen therapy 06/28/2020  . Fibromyalgia 06/28/2020  . Myasthenia gravis (HCC) 05/23/2020  . Malnutrition (HCC) 05/20/2020  . Myasthenia exacerbation (HCC) 05/16/2020    Past Surgical History:  Procedure Laterality Date  . IR REMOVAL TUN CV CATH W/O FL  06/03/2020     OB History   No obstetric history on file.     Family History  Adopted: Yes    Social History   Tobacco Use  . Smoking status: Former Games developermoker  . Smokeless tobacco: Never Used    Home Medications Prior to Admission medications   Medication Sig Start Date End Date Taking? Authorizing Provider  albuterol (VENTOLIN HFA) 108 (90 Base) MCG/ACT inhaler Inhale into the lungs every 6 (six) hours as needed for wheezing or shortness of breath.    [provider]  baclofen (LIORESAL) 10 MG tablet Take 10 mg by mouth See admin instructions. Take one 10 mg tablet four times a day at 6AM, 12PM, 6PM, and 12AM    [provider]  cromolyn (GASTROCROM) 100 MG/5ML solution Take 100 mg by mouth See admin instructions. 2 ampule in water taken 4 times a day before meals/bed and PRN    [provider]  cromolyn (INTAL) 20 MG/2ML nebulizer solution Take 20 mg by nebulization See admin instructions. 1 vial taken 3 times a day and PRN    [provider]  diphenhydrAMINE-zinc acetate (BENADRYL) cream Apply 1-2 application topically daily as needed (Rash from Mast Cell and Allergies).    [provider]  EPINEPHrine 0.3  mg/0.3 mL IJ SOAJ injection epinephrine 0.3 mg/0.3 mL injection, auto-injector  USE AS DIRECTED FOR ALLERGIC REACTION    [provider]  famotidine (PEPCID) 40 MG tablet Take 40 mg by mouth See admin instructions. Take one 40 mg tablet twice a day at 6AM and 6PM.    [provider]  Fluticasone-Salmeterol (ADVAIR) 100-50 MCG/DOSE AEPB Inhale into the lungs. 06/01/20   [provider]  ipratropium-albuterol (DUONEB)  0.5-2.5 (3) MG/3ML SOLN ipratropium 0.5 mg-albuterol 3 mg (2.5 mg base)/3 mL nebulization soln    [provider]  ketotifen (ZADITOR) 0.025 % ophthalmic solution Place 1 drop into both eyes daily as needed (Mast cell, Allergies, MCS/EI).    [provider]  levocetirizine (XYZAL) 5 MG tablet Take by mouth. 06/01/20   [provider]  LORazepam (ATIVAN) 0.5 MG tablet Take 0.5 mg by mouth every 6 (six) hours as needed for anxiety.    [provider]  ondansetron (ZOFRAN) 4 MG tablet Zofran 4 mg tablet  Take 2 tablets by oral route.    [provider]  ondansetron (ZOFRAN-ODT) 8 MG disintegrating tablet Take 8 mg by mouth every 8 (eight) hours as needed for nausea or vomiting.    [provider]  potassium chloride (KLOR-CON) 10 MEQ tablet potassium chloride ER 10 mEq tablet,extended release  TAKE 1 TABLET BY MOUTH EVERY DAY    [provider]  predniSONE (DELTASONE) 5 MG tablet Take 5 tablets (25 mg total) by mouth daily with breakfast. 05/22/20   Lewie Chamber, MD  pyridostigmine (MESTINON) 60 MG tablet Take 1.5 tablets (90 mg total) by mouth every 3 (three) hours. 05/21/20   Lewie Chamber, MD  sodium chloride 1 g tablet sodium chloride 1 gram tablet  TAKE 1 TABLET BY MOUTH EVERY DAY    [provider]  vitamin B-12 (CYANOCOBALAMIN) 1000 MCG tablet Take 1,000 mcg by mouth daily.    [provider]  Vitamin D, Ergocalciferol, (DRISDOL) 1.25 MG (50000 UNIT) CAPS capsule Take 50,000 Units by mouth 2 (two) times a week.    [provider]  zafirlukast (ACCOLATE) 20 MG tablet zafirlukast 20 mg tablet  TAKE 1 TABLET BY MOUTH 1 HOUR BEFORE OR 2 HOURS AFTER MEALS TWICE A DAY FOR 30 DAYS 02/17/20   [provider]    Allergies    Aspirin, Nsaids, Quinolones, Sulfa antibiotics, Telithromycin, Fludrocortisone, Ambien [zolpidem], Beef-potatoes-spinach [compleat], Botulinum toxins, Chlorhexidine, Codeine,  Contrast media [iodinated diagnostic agents], Creon [pancrelipase (lip-prot-amyl)], Cymbalta [duloxetine hcl], Dextrans, Ergotamine, Gabapentin, Gallamine, Hydrocodone, Lactose intolerance (gi), Lunesta [eszopiclone], Macrolides and ketolides, Maprotiline, Marplan [isocarboxazid], Midodrine, Morphine and related, Nitrous oxide, Oxycodone, Pethidine [meperidine], Phenergan [promethazine], Phenobarbital, Pyridium [phenazopyridine], Reglan [metoclopramide], Remeron [mirtazapine], Salicylates, Savella [milnacipran], Scopolamine, Sumatriptan, Tape, Topiramate, Tricyclic antidepressants, Tums [calcium carbonate], and Vancomycin  Review of Systems   Review of Systems  Constitutional: Positive for fatigue. Negative for chills and fever.  HENT: Positive for trouble swallowing. Negative for ear pain and sore throat.   Eyes: Negative for pain and visual disturbance.  Respiratory: Positive for shortness of breath. Negative for cough.   Cardiovascular: Negative for chest pain and palpitations.  Gastrointestinal: Positive for abdominal pain (intermittent). Negative for diarrhea, nausea and vomiting.  Genitourinary: Negative for dysuria and hematuria.  Musculoskeletal: Negative for arthralgias and back pain.  Skin: Positive for color change (breakdown around peg tube site). Negative for rash.  Neurological: Negative for seizures and syncope.  All other systems reviewed and are negative.   Physical Exam Updated  Vital Signs  ED Triage Vitals  Enc Vitals Group     BP 07/09/20 1723 (!) 146/86     Pulse Rate 07/09/20 1723 66     Resp 07/09/20 1723 (!) 26     Temp --      Temp src --      SpO2 07/09/20 1723 100 %     Weight --      Height --      Head Circumference --      Peak Flow --      Pain Score 07/09/20 1709 0     Pain Loc --      Pain Edu? --      Excl. in GC? --     Physical Exam Vitals and nursing note reviewed.  Constitutional:      General: She is not in acute distress.     Appearance: She is well-developed and well-nourished. She is ill-appearing.  HENT:     Head: Normocephalic and atraumatic.     Nose: Nose normal.     Mouth/Throat:     Mouth: Mucous membranes are moist.  Eyes:     Extraocular Movements: Extraocular movements intact.     Conjunctiva/sclera: Conjunctivae normal.     Pupils: Pupils are equal, round, and reactive to light.  Cardiovascular:     Rate and Rhythm: Normal rate and regular rhythm.     Pulses: Normal pulses.     Heart sounds: Normal heart sounds. No murmur heard.   Pulmonary:     Effort: Pulmonary effort is normal. No respiratory distress.     Breath sounds: Normal breath sounds.  Abdominal:     Palpations: Abdomen is soft.     Tenderness: There is no abdominal tenderness.  Musculoskeletal:        General: No edema.     Cervical back: Neck supple.  Skin:    General: Skin is warm and dry.     Comments:  granulation tissue around PEG tube site but no active drainage, gastric contents within back to, there is some firmness surrounding subcutaneous tissue around peg tube  Neurological:     General: No focal deficit present.     Mental Status: She is alert and oriented to person, place, and time.     Cranial Nerves: No cranial nerve deficit.     Sensory: No sensory deficit.     Motor: No weakness.     Comments: Patient with some difficulty speaking  Psychiatric:        Mood and Affect: Mood and affect normal.     ED Results / Procedures / Treatments   Labs (all labs ordered are listed, but only abnormal results are displayed) Labs Reviewed  CBC WITH DIFFERENTIAL/PLATELET - Abnormal; Notable for the following components:      Result Value   RBC 3.41 (*)    Hemoglobin 9.3 (*)    HCT 29.8 (*)    Lymphs Abs 0.4 (*)    All other components within normal limits  SARS CORONAVIRUS 2 BY RT PCR (HOSPITAL ORDER, PERFORMED IN [facility])  GROUP A STREP BY PCR  LACTIC ACID, PLASMA  MAGNESIUM  PHOSPHORUS   COMPREHENSIVE METABOLIC PANEL  URINALYSIS, ROUTINE W REFLEX MICROSCOPIC  PREGNANCY, URINE  THIOPURINE METHYLTRANSFERASE (TPMT), RBC  TSH  I-STAT BETA HCG BLOOD, ED ([facility], [facility], [facility] ONLY)  POC URINE PREG, ED    EKG None  Radiology CT ABDOMEN PELVIS WO CONTRAST  Result Date: 07/09/2020 CLINICAL  DATA:  Pain around a tube site. EXAM: CT ABDOMEN AND PELVIS WITHOUT CONTRAST TECHNIQUE: Multidetector CT imaging of the abdomen and pelvis was performed following the standard protocol without IV contrast. COMPARISON:  None. FINDINGS: Lower chest: The lung bases are clear. The heart size is normal. The intracardiac blood pool is hypodense relative to the adjacent myocardium consistent with anemia. Hepatobiliary: The liver is normal. Status post cholecystectomy.There is no biliary ductal dilation. Pancreas: Normal contours without ductal dilatation. No peripancreatic fluid collection. Spleen: Unremarkable. Adrenals/Urinary Tract: --Adrenal glands: Unremarkable. --Right kidney/ureter: There appears to be a nonobstructing stone in the interpolar region of the right kidney. --Left kidney/ureter: No hydronephrosis or radiopaque kidney stones. --Urinary bladder: Unremarkable. Stomach/Bowel: --Stomach/Duodenum: There is a gastrojejunostomy tube in place. The retention bulb has retracted and now resides within the subcutaneous soft tissues. Repositioning is recommended. The tube is otherwise well position with the tip terminating in the proximal jejunum. --Small bowel: Unremarkable. --Colon: Unremarkable. --Appendix: Normal. Vascular/Lymphatic: Normal course and caliber of the major abdominal vessels. --No retroperitoneal lymphadenopathy. --No mesenteric lymphadenopathy. --No pelvic or inguinal lymphadenopathy. Reproductive: Both ovaries appear slightly enlarged with multiple follicles or cysts, not well evaluated in the absence of IV contrast. Other: There is a small volume of pelvic free fluid which is likely physiologic.  No free air. The abdominal wall is normal. Musculoskeletal. No acute displaced fractures. IMPRESSION: 1. There is a gastrojejunostomy tube in place. The retention bulb has retracted and now resides within the subcutaneous soft tissues. Repositioning is recommended. The tube is otherwise well positioned with the tip terminating in the proximal jejunum. 2. Both ovaries appear slightly enlarged with multiple follicles or cysts, not well evaluated in the absence of IV contrast. Consider PCOS. 3. Nonobstructive right nephrolithiasis. 4. Anemia. Electronically Signed   By: Katherine Mantlehristopher  Green M.D.   On: 07/09/2020 22:47   DG Chest Portable 1 View  Result Date: 07/09/2020 CLINICAL DATA:  Shortness of breath EXAM: PORTABLE CHEST 1 VIEW COMPARISON:  None. FINDINGS: The heart size and mediastinal contours are within normal limits. A right-sided MediPort catheter seen with the tip at the superior cavoatrial junction. Both lungs are clear. The visualized skeletal structures are unremarkable. IMPRESSION: No active disease. Electronically Signed   By: Jonna ClarkBindu  Avutu M.D.   On: 07/09/2020 19:23    Procedures Procedures (including critical care time)  Medications Ordered in ED Medications  sodium chloride flush (NS) 0.9 % injection 10-40 mL (20 mLs Intracatheter Given 07/09/20 2251)  sodium chloride flush (NS) 0.9 % injection 10-40 mL (has no administration in time range)  0.9 %  sodium chloride infusion (has no administration in time range)    ED Course  I have reviewed the triage vital signs and the nursing notes.  Pertinent labs & imaging results that were available during my care of the patient were reviewed by me and considered in my medical decision making (see chart for details).    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 Patient overall with normal vitals. No fever. Patient here with issues with her feeding tube. She is concerned for infection around the feeding tube and is well as  possible displacement. Feeding tube overall appears clean but there is some the granulated tissue. She does have some firmness around then PEG tube but there is no active drainage. There is gastric contents within the feeding tube. She does not have any real abdominal tenderness on exam. We'll get a CT scan to evaluate. She has severe contrast allergy and will get without contrast. We'll get basic labs. Patient also having ongoing issues with myasthenia gravis. Has severe symptoms at times. She had myasthenia triggered upon arrival and took her Mestinon with improvement. She appears neurologically intact. She is on chronic high-dose steroids as well as Mestinon for myasthenia gravis. She had plasmapheresis back in November with great improvement. Dr. Thomasena Edis with neurology at the bedside and we were able to discuss at length with patient about her myasthenia gravis. Overall she is not on great therapy. She is still having multiple flares. She is having a mild flare right now but nothing that would require plasmapheresis. Multiple treatment options were discussed with neurology who recommends admission and will try to come up with plan going forward. Problem is that patient has moved here from [place] and does not have any primary care doctor or neurologist or GI doctor. She has been getting medications from all doctors and overall has very poor social support both with her medical problems and with her daily living. She is unable to take care of herself and her child at home. Admission hopefully will also help clean up her medication list and get her refills of medication she needs another case management and social work issues. Neurology will have ongoing  discussions with her about chronic treatment plan and hopefully will be able to help arrange for close outpatient follow-up. We'll add some blood work to see if she would be a candidate for some other myasthenia gravis treatments. We'll continue work-up from an abdominal standpoint with CT scan and basic labs. Will admit to medicine once work-up is completed. No obvious signs of respiratory distress at this time. We'll place her on end-tidal CO2 per recommendations of neurology. We'll also check her for COVID and strep given that she has had some cold symptoms on her throat. She is not vaccinated against coronavirus.  Chest x-ray shows no signs of infection.  No fever.  Negative pregnancy test.  Lactic acid is normal.  No significant anemia, electrolyte abnormality, no significant leukocytosis.  CT scan shows that the GJ tube is in place however the retention bulb has retracted and now resides within the subcu soft tissues.  This is likely the cause of her discomfort.  This likely needs to be repositioned.  Tube is otherwise well positioned with the tip terminating in the proximal jejunum.  Overall work-up from an abdominal standpoint is unremarkable.  No concern for infectious process.  Neurology will continue to follow and make recommendations on myasthenia gravis medications.  Will admit for further care.  This chart was dictated using voice recognition software.  Despite best efforts to proofread,  errors can occur which can change the documentation meaning.    Final Clinical Impression(s) / ED Diagnoses Final diagnoses:  Weakness  Myasthenia exacerbation (HCC)  Feeding tube dysfunction, initial encounter    Rx / DC Orders ED Discharge Orders    None       Virgina Norfolk, DO 07/09/20 2257

## 2020-07-10 ENCOUNTER — Observation Stay (HOSPITAL_COMMUNITY): Payer: Medicare Other

## 2020-07-10 DIAGNOSIS — D8940 Mast cell activation, unspecified: Secondary | ICD-10-CM

## 2020-07-10 DIAGNOSIS — K9423 Gastrostomy malfunction: Secondary | ICD-10-CM | POA: Diagnosis not present

## 2020-07-10 DIAGNOSIS — M797 Fibromyalgia: Secondary | ICD-10-CM | POA: Diagnosis not present

## 2020-07-10 DIAGNOSIS — R531 Weakness: Secondary | ICD-10-CM

## 2020-07-10 DIAGNOSIS — I498 Other specified cardiac arrhythmias: Secondary | ICD-10-CM | POA: Diagnosis not present

## 2020-07-10 DIAGNOSIS — G7000 Myasthenia gravis without (acute) exacerbation: Secondary | ICD-10-CM

## 2020-07-10 DIAGNOSIS — T85598A Other mechanical complication of other gastrointestinal prosthetic devices, implants and grafts, initial encounter: Secondary | ICD-10-CM

## 2020-07-10 DIAGNOSIS — G901 Familial dysautonomia [Riley-Day]: Secondary | ICD-10-CM

## 2020-07-10 DIAGNOSIS — M532X2 Spinal instabilities, cervical region: Secondary | ICD-10-CM

## 2020-07-10 LAB — GROUP A STREP BY PCR: Group A Strep by PCR: NOT DETECTED

## 2020-07-10 LAB — BASIC METABOLIC PANEL
Anion gap: 13 (ref 5–15)
BUN: <5 mg/dL — ABNORMAL LOW (ref 6–20)
CO2: 25 mmol/L (ref 22–32)
Calcium: 8.6 mg/dL — ABNORMAL LOW (ref 8.9–10.3)
Chloride: 105 mmol/L (ref 98–111)
Creatinine, Ser: 0.61 mg/dL (ref 0.44–1.00)
GFR, Estimated: >60 mL/min (ref >60)
Glucose, Bld: 89 mg/dL (ref 70–99)
Potassium: 3.2 mmol/L — ABNORMAL LOW (ref 3.5–5.1)
Sodium: 143 mmol/L (ref 135–145)

## 2020-07-10 MED ORDER — DIPHENHYDRAMINE HCL 25 MG PO CAPS: 50.0000 mg | ORAL_CAPSULE | Freq: Once | ORAL | Status: DC

## 2020-07-10 MED ORDER — LORAZEPAM 0.5 MG PO TABS
0.5000 mg | ORAL_TABLET | Freq: Four times a day (QID) | ORAL | Status: DC | PRN
  Administered 2020-07-10 – 2020-07-11 (×4): 0.5 mg via ORAL
  Filled 2020-07-10 (×5): qty 1

## 2020-07-10 MED ORDER — ACETAMINOPHEN 325 MG PO TABS: 650.0000 mg | ORAL_TABLET | Freq: Four times a day (QID) | ORAL | Status: DC | PRN

## 2020-07-10 MED ORDER — PREDNISONE 5 MG PO TABS
25.0000 mg | ORAL_TABLET | Freq: Every day | ORAL | Status: DC
  Administered 2020-07-11: 25 mg via ORAL
  Filled 2020-07-10 (×3): qty 1

## 2020-07-10 MED ORDER — FAMOTIDINE 20 MG PO TABS
40.0000 mg | ORAL_TABLET | Freq: Two times a day (BID) | ORAL | Status: DC
  Administered 2020-07-10 – 2020-07-11 (×4): 40 mg via ORAL
  Filled 2020-07-10 (×4): qty 2

## 2020-07-10 MED ORDER — IOHEXOL 300 MG/ML  SOLN: 50.0000 mL | Freq: Once | INTRAMUSCULAR | Status: DC | PRN

## 2020-07-10 MED ORDER — ZAFIRLUKAST 20 MG PO TABS
20.0000 mg | ORAL_TABLET | Freq: Two times a day (BID) | ORAL | Status: DC
  Filled 2020-07-10 (×2): qty 1

## 2020-07-10 MED ORDER — CROMOLYN SODIUM 20 MG/2ML IN NEBU
20.0000 mg | INHALATION_SOLUTION | Freq: Three times a day (TID) | RESPIRATORY_TRACT | Status: DC
  Administered 2020-07-10 – 2020-07-11 (×6): 20 mg via RESPIRATORY_TRACT
  Filled 2020-07-10 (×7): qty 2

## 2020-07-10 MED ORDER — TETRACAINE HCL 1 % IJ SOLN
20.0000 mg | Freq: Once | INTRAMUSCULAR | Status: DC
  Filled 2020-07-10: qty 2

## 2020-07-10 MED ORDER — LORATADINE 10 MG PO TABS
10.0000 mg | ORAL_TABLET | Freq: Every day | ORAL | Status: DC
  Filled 2020-07-10: qty 1

## 2020-07-10 MED ORDER — BACLOFEN 10 MG PO TABS: 10.0000 mg | ORAL_TABLET | ORAL | Status: DC

## 2020-07-10 MED ORDER — DEXTROSE-NACL 5-0.45 % IV SOLN: INTRAVENOUS | Status: DC

## 2020-07-10 MED ORDER — KETOTIFEN FUMARATE 0.025 % OP SOLN
1.0000 [drp] | Freq: Every day | OPHTHALMIC | Status: DC | PRN
  Filled 2020-07-10: qty 5

## 2020-07-10 MED ORDER — ACETAMINOPHEN 650 MG RE SUPP: 650.0000 mg | Freq: Four times a day (QID) | RECTAL | Status: DC | PRN

## 2020-07-10 MED ORDER — PREDNISONE 20 MG PO TABS: 50.0000 mg | ORAL_TABLET | Freq: Once | ORAL | Status: DC

## 2020-07-10 MED ORDER — ONDANSETRON HCL 4 MG/2ML IJ SOLN
4.0000 mg | Freq: Four times a day (QID) | INTRAMUSCULAR | Status: DC | PRN
  Administered 2020-07-10 – 2020-07-11 (×4): 4 mg via INTRAVENOUS
  Filled 2020-07-10 (×5): qty 2

## 2020-07-10 MED ORDER — LEVOCETIRIZINE DIHYDROCHLORIDE 5 MG PO TABS: 10.0000 mg | ORAL_TABLET | Freq: Every morning | ORAL | Status: DC

## 2020-07-10 MED ORDER — IVABRADINE HCL 5 MG PO TABS: 5.0000 mg | ORAL_TABLET | Freq: Two times a day (BID) | ORAL | Status: DC

## 2020-07-10 MED ORDER — LEVOCETIRIZINE DIHYDROCHLORIDE 5 MG PO TABS: 10.0000 mg | ORAL_TABLET | Freq: Two times a day (BID) | ORAL | Status: DC

## 2020-07-10 MED ORDER — PYRIDOSTIGMINE BROMIDE 60 MG PO TABS
90.0000 mg | ORAL_TABLET | ORAL | Status: DC
  Administered 2020-07-10 – 2020-07-11 (×7): 90 mg via ORAL
  Filled 2020-07-10 (×13): qty 1.5

## 2020-07-10 MED ORDER — POTASSIUM CHLORIDE 10 MEQ/100ML IV SOLN
10.0000 meq | INTRAVENOUS | Status: AC
  Administered 2020-07-10: 10 meq via INTRAVENOUS
  Filled 2020-07-10: qty 100

## 2020-07-10 MED ORDER — MONTELUKAST SODIUM 10 MG PO TABS
10.0000 mg | ORAL_TABLET | Freq: Every day | ORAL | Status: DC
  Filled 2020-07-10 (×2): qty 1

## 2020-07-10 MED ORDER — BACLOFEN 10 MG PO TABS
10.0000 mg | ORAL_TABLET | Freq: Four times a day (QID) | ORAL | Status: DC
  Administered 2020-07-10 – 2020-07-11 (×3): 10 mg via ORAL
  Filled 2020-07-10 (×7): qty 1

## 2020-07-10 MED ORDER — IPRATROPIUM-ALBUTEROL 0.5-2.5 (3) MG/3ML IN SOLN
3.0000 mL | Freq: Four times a day (QID) | RESPIRATORY_TRACT | Status: DC
  Administered 2020-07-10 – 2020-07-11 (×6): 3 mL via RESPIRATORY_TRACT
  Filled 2020-07-10 (×9): qty 3

## 2020-07-10 MED ORDER — IVABRADINE HCL 5 MG PO TABS: 5.0000 mg | ORAL_TABLET | Freq: Once | ORAL | Status: DC

## 2020-07-10 MED ORDER — ENOXAPARIN SODIUM 40 MG/0.4ML ~~LOC~~ SOLN: 40.0000 mg | SUBCUTANEOUS | Status: DC

## 2020-07-10 MED ORDER — MOMETASONE FURO-FORMOTEROL FUM 100-5 MCG/ACT IN AERO
2.0000 | INHALATION_SPRAY | Freq: Two times a day (BID) | RESPIRATORY_TRACT | Status: DC
  Administered 2020-07-11: 2 via RESPIRATORY_TRACT
  Filled 2020-07-10: qty 8.8

## 2020-07-10 MED ORDER — ALBUTEROL SULFATE HFA 108 (90 BASE) MCG/ACT IN AERS
1.0000 | INHALATION_SPRAY | Freq: Four times a day (QID) | RESPIRATORY_TRACT | Status: DC | PRN
  Filled 2020-07-10: qty 6.7

## 2020-07-10 NOTE — Progress Notes (Signed)
Patient started hitting herself in the head when I explained that the on call doctor will not make changes at this time to her medication requests.  On-call MD stated to have the admitting doctor to address these medications issues in the morning.  Patient started hitting her self in the head and wants to leave AMA but need a ride in order to leave.  She stated that she needs stretcher transport which we do not provide when I patient decide they want to leave against medical advice.  As staff walked out of the room, patient took pitcher of ice water and threw it at the door, spilling water and ice all on the floor.

## 2020-07-10 NOTE — ED Notes (Signed)
Started pt's nebulizers, pt asked to stop, states, "I can't right now."  Neb turned off, will check back with pt.

## 2020-07-10 NOTE — ED Notes (Signed)
Pt continuously manipulating GJ tube. Pt c/o drainage and pain. Pt requested to clean and dress GJ tube herself. Area cleaned with sterile water and non-adherent dressing placed around tube. Informed pt to not keep touching area and leave the tube alone.

## 2020-07-10 NOTE — Evaluation (Signed)
Physical Therapy Evaluation Patient Details Name: Brandy [name] MRN: [id number] DOB: [birth year] Today's Date: 07/10/2020   History of Present Illness  Pt is a 30 y/o female admitted for G tube dysfunction. 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  Clinical Impression  Pt admitted secondary to problem above with deficits below. Pt extremely anxious throughout and very limited in what mobility she would perform. Was able to come to long sitting with min A on stretcher; pt refusing further mobility. Pt with generalized weakness throughout BUE and BLE. Pt reports she normally uses power WC but has days where she can not get out of bed. PT has concerns about pt not being able to care for herself, and pt with limited tolerance for discussion about these things. Reports she has a son she has to get home to. Feel pt would benefit from therapies in a post acute rehab setting, however, pt will refuse and is wanting to go home with max caregivers. Pt reports she has no family that can assist. Requesting adaptive equipment and tub lift. Feel pt would benefit from a hospital bed and hoyer lift should she go home, but will need to ensure caregiver support to use hoyer. Will continue to follow acutely.     Follow Up Recommendations Supervision/Assistance - 24 hour (Pt refusing SNF but wanting max HH services; HHPT/OT/SLP/RN/Aide/social worker)    Equipment Recommendations  3in1 (PT);Hospital bed;Other (comment) (drop arm BSC; hoyer lift with pad)    Recommendations for Other Services       Precautions / Restrictions Precautions Precautions:  Fall Restrictions Weight Bearing Restrictions: No      Mobility  Bed Mobility Overal bed mobility: Needs Assistance             General bed mobility comments: Min A to come to long sitting using BUE to pull on bed rails. Pt refusing further mobility secondary to anxiety.    Transfers                    Ambulation/Gait                Stairs            Wheelchair Mobility    Modified Rankin (Stroke Patients Only)       Balance                                             Pertinent Vitals/Pain Pain Assessment: No/denies pain    Home Living Family/patient expects to be discharged to:: Private residence Living Arrangements: Children Available Help at Discharge: Other (Comment) (reports no one available to help) Type of Home: House Home Access: Ramped entrance     Home Layout: One level Home Equipment: Wheelchair - power;Other (comment) (slide board)      Prior Function           Comments: Pt reports using power WC at baseline, but sometimes cannot get out of bed on her own. When asked about caregivers, reports she does not have one here and is looking for help to get one. When she is able to transfer she  will either use slide board or can stand.     Hand Dominance        Extremity/Trunk Assessment   Upper Extremity Assessment Upper Extremity Assessment: Defer to OT evaluation;RUE deficits/detail;LUE deficits/detail RUE Deficits / Details: Limited ROM at shoulders LUE Deficits / Details: Limited ROM at shoulders; reports she has scapular dysfunction.    Lower Extremity Assessment Lower Extremity Assessment: Generalized weakness (Was able to perform ankle pumps, and was able to lift legs minimally off the bed)    Cervical / Trunk Assessment Cervical / Trunk Assessment: Other exceptions Cervical / Trunk Exceptions: Poor trunk control  Communication   Communication: No difficulties;Expressive difficulties  (Anxiety very limiting with communication; pt reporting she could not talk much as she had not had her meds.)  Cognition Arousal/Alertness: Awake/alert Behavior During Therapy: Anxious Overall Cognitive Status: No family/caregiver present to determine baseline cognitive functioning                                 General Comments: Pt extremely anxious throughout. Required cues for deep breathing to calm. Tearful throughout. Limited participation secondary to anxiety.      General Comments General comments (skin integrity, edema, etc.): Lengthy discussion about what DME pt is wanting. Pt reports she needs shower lift and adaptive equimpment like long handled sponge, grabber, etc to care for herself.    Exercises     Assessment/Plan    PT Assessment Patient needs continued PT services  PT Problem List Decreased strength;Decreased balance;Decreased activity tolerance;Decreased mobility;Decreased safety awareness;Decreased knowledge of precautions;Decreased knowledge of use of DME;Cardiopulmonary status limiting activity       PT Treatment Interventions DME instruction;Functional mobility training;Therapeutic exercise;Therapeutic activities;Balance training;Neuromuscular re-education;Patient/family education    PT Goals (Current goals can be found in the Care Plan section)  Acute Rehab PT Goals Patient Stated Goal: to go home PT Goal Formulation: With patient Time For Goal Achievement: 07/24/20 Potential to Achieve Goals: Fair    Frequency Min 3X/week   Barriers to discharge Decreased caregiver support      Co-evaluation               AM-PAC PT "6 Clicks" Mobility  Outcome Measure Help needed turning from your back to your side while in a flat bed without using bedrails?: A Little Help needed moving from lying on your back to sitting on the side of a flat bed without using bedrails?: A Lot Help needed moving to and from a bed to a chair (including a  wheelchair)?: A Lot Help needed standing up from a chair using your arms (e.g., wheelchair or bedside chair)?: A Lot Help needed to walk in hospital room?: Total Help needed climbing 3-5 steps with a railing? : Total 6 Click Score: 11    End of Session   Activity Tolerance: Treatment limited secondary to medical complications (Comment) (limited by anxiety) Patient left: in bed;with call bell/phone within reach (on stretcher in ED) Nurse Communication: Mobility status;Other (comment) (Pt taking her home meds) PT Visit Diagnosis: Unsteadiness on feet (R26.81);Muscle weakness (generalized) (M62.81);Difficulty in walking, not elsewhere classified (R26.2)    Time: 1950-9326 PT Time Calculation (min) (ACUTE ONLY): 29 min   Charges:   PT Evaluation $PT Eval Moderate Complexity: 1 Mod PT Treatments $Self Care/Home Management: 8-22        Cindee Salt, DPT  Acute Rehabilitation Services  Pager: [phone] Office: [phone]   Brandy [name]  Brandy [name] 07/10/2020, 10:15 AM

## 2020-07-10 NOTE — H&P (Signed)
History and Physical    Brandy [name] WCH:[id number] DOB: [birth year] DOA: 07/09/2020  PCP: Marcine Matar, MD Patient coming from: Home  Chief Complaint: Feeding tube infected  HPI: Brandy [name] is a 30 y.o. 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  History limited as patient was wearing a respirator and speaking softly which made it difficult to understand her speech.  Reports noticing drainage and redness around her feeding tube site.  She has not used the feeding tube for several days since this problem started and there is tenderness around the feeding tube site.  Patient states she is currently taking Mestinon and prednisone for her myasthenia gravis and is concerned that it is not being managed properly.  She is feeling weak and having some difficulty speaking and shortness of breath intermittently.  States she tried to make an appointment with a neurologist here but they did not allow a virtual visit.  She is hesitant going into the clinic as they use scent diffusers/air fresheners.  States she was seen by her primary care physician who then referred her to another practice as her case was felt to be complex.  It was difficult to understand the patient but it seems she has not had primary care visits since then.  She did mention an upcoming appointment next month.  She was also referred to an allergist at [facility].  ED Course: Vital signs stable. SPO2 100% on room air. WBC 6.2, hemoglobin 9.3 (at baseline), platelet count 256K. Sodium 140,  potassium 3.3, chloride 104, bicarb 26, BUN <5, creatinine 0.5, glucose 98. No elevation of LFTs. Beta-hCG negative. Lactic acid within normal range. SARS-CoV-2 PCR test negative. Magnesium level normal. Phosphorus level normal. TSH normal. UA without signs of infection. Group A strep PCR negative. Chest x-ray showing no active disease. CT abdomen pelvis showing gastrojejunostomy tube in place, however, the retention bulb is retracted and now resides within the subcutaneous soft tissues. Neurology consulted due to concern for myasthenia gravis flare and recommended hospital admission.  Review of Systems:  All systems reviewed and apart from history of presenting illness, are negative.  Past Medical History:  Diagnosis Date  . Myasthenia gravis [facility])     Past Surgical History:  Procedure Laterality Date  . IR REMOVAL TUN CV CATH W/O FL  06/03/2020     reports that she has quit smoking. She has never used smokeless tobacco. No history on file for alcohol use and drug use.  Allergies  Allergen Reactions  . Aspirin   . Nsaids   . Quinolones   . Sulfa Antibiotics   . Telithromycin   . Fludrocortisone   . Ambien [Zolpidem]   . Beef-Potatoes-Spinach [Compleat]   . Botulinum Toxins   . Chlorhexidine Hives  . Codeine   . Contrast Media [Iodinated Diagnostic Agents]   . Creon [Pancrelipase (Lip-Prot-Amyl)]   . Cymbalta [Duloxetine Hcl]   . Dextrans   . Ergotamine   . Gabapentin   . Gallamine   . Hydrocodone   . Lactose Intolerance (Gi)   . Lunesta [Eszopiclone]   . Macrolides And Ketolides   . Maprotiline   . Marplan [Isocarboxazid]  Any MAOI contraindicated  . Midodrine   . Morphine And Related   . Nitrous Oxide   . Oxycodone   . Pethidine [Meperidine]   . Phenergan [Promethazine]   . Phenobarbital   . Pyridium [Phenazopyridine]   . Reglan [Metoclopramide]   . Remeron [Mirtazapine]     Noradrenergic antagonist  . Salicylates   . Savella [Milnacipran]   . Scopolamine    . Sumatriptan   . Tape   . Topiramate   . Tricyclic Antidepressants   . Tums [Calcium Carbonate]     Patient reports allergic.  RN to find out adverse reaction and update allergy field.   . Vancomycin     Family History  Adopted: Yes    Prior to Admission medications   Medication Sig Start Date End Date Taking? Authorizing Provider  albuterol (VENTOLIN HFA) 108 (90 Base) MCG/ACT inhaler Inhale into the lungs every 6 (six) hours as needed for wheezing or shortness of breath.    [provider]  baclofen (LIORESAL) 10 MG tablet Take 10 mg by mouth See admin instructions. Take one 10 mg tablet four times a day at 6AM, 12PM, 6PM, and 12AM    [provider]  cromolyn (GASTROCROM) 100 MG/5ML solution Take 100 mg by mouth See admin instructions. 2 ampule in water taken 4 times a day before meals/bed and PRN    [provider]  cromolyn (INTAL) 20 MG/2ML nebulizer solution Take 20 mg by nebulization See admin instructions. 1 vial taken 3 times a day and PRN    [provider]  diphenhydrAMINE-zinc acetate (BENADRYL) cream Apply 1-2 application topically daily as needed (Rash from Mast Cell and Allergies).    [provider]  EPINEPHrine 0.3 mg/0.3 mL IJ SOAJ injection epinephrine 0.3 mg/0.3 mL injection, auto-injector  USE AS DIRECTED FOR ALLERGIC REACTION    [provider]  famotidine (PEPCID) 40 MG tablet Take 40 mg by mouth See admin instructions. Take one 40 mg tablet twice a day at 6AM and 6PM.    [provider]  Fluticasone-Salmeterol (ADVAIR) 100-50 MCG/DOSE AEPB Inhale into the lungs. 06/01/20   [provider]  ipratropium-albuterol (DUONEB) 0.5-2.5 (3) MG/3ML SOLN ipratropium 0.5 mg-albuterol 3 mg (2.5 mg base)/3 mL nebulization soln    [provider]  ketotifen (ZADITOR) 0.025 % ophthalmic solution Place 1 drop into both eyes daily as needed (Mast cell, Allergies, MCS/EI).    [provider]   levocetirizine (XYZAL) 5 MG tablet Take by mouth. 06/01/20   [provider]  LORazepam (ATIVAN) 0.5 MG tablet Take 0.5 mg by mouth every 6 (six) hours as needed for anxiety.    [provider]  ondansetron (ZOFRAN) 4 MG tablet Zofran 4 mg tablet  Take 2 tablets by oral route.    [provider]  ondansetron (ZOFRAN-ODT) 8 MG disintegrating tablet Take 8 mg by mouth every 8 (eight) hours as needed for nausea or vomiting.    [provider]  potassium chloride (KLOR-CON) 10 MEQ tablet potassium chloride ER 10 mEq tablet,extended release  TAKE 1 TABLET BY MOUTH EVERY DAY    [provider]  predniSONE (DELTASONE) 5 MG tablet Take 5 tablets (25 mg total) by mouth daily with breakfast. 05/22/20   Lewie ChamberGirguis, David, MD  pyridostigmine (MESTINON) 60 MG tablet Take 1.5 tablets (90 mg total) by mouth every 3 (three) hours. 05/21/20   Lewie ChamberGirguis, David, MD  sodium chloride 1 g tablet sodium chloride 1 gram tablet  TAKE 1  TABLET BY MOUTH EVERY DAY    [provider]  vitamin B-12 (CYANOCOBALAMIN) 1000 MCG tablet Take 1,000 mcg by mouth daily.    [provider]  Vitamin D, Ergocalciferol, (DRISDOL) 1.25 MG (50000 UNIT) CAPS capsule Take 50,000 Units by mouth 2 (two) times a week.    [provider]  zafirlukast (ACCOLATE) 20 MG tablet zafirlukast 20 mg tablet  TAKE 1 TABLET BY MOUTH 1 HOUR BEFORE OR 2 HOURS AFTER MEALS TWICE A DAY FOR 30 DAYS 02/17/20   [provider]    Physical Exam: Vitals:   07/09/20 2130 07/09/20 2200 07/09/20 2334 07/09/20 2345  BP: 127/78 127/66 126/82 (!) 142/95  Pulse: 62 65 73 61  Resp:  15 19 18   Temp:  98.2 F (36.8 C)    TempSrc:  Oral    SpO2: 100% 100% 96% 100%    Physical Exam Constitutional:      General: She is not in acute distress. HENT:     Head: Normocephalic and atraumatic.  Eyes:     Extraocular Movements: Extraocular movements intact.     Conjunctiva/sclera: Conjunctivae  normal.  Cardiovascular:     Rate and Rhythm: Normal rate and regular rhythm.     Pulses: Normal pulses.  Pulmonary:     Effort: Pulmonary effort is normal. No respiratory distress.     Breath sounds: No wheezing or rales.  Abdominal:     General: Bowel sounds are normal. There is no distension.     Palpations: Abdomen is soft.     Tenderness: There is no abdominal tenderness. There is no guarding or rebound.     Comments: Granulation tissue noted around the feeding tube site   Musculoskeletal:        General: No swelling or tenderness.     Cervical back: Normal range of motion and neck supple.  Skin:    General: Skin is warm and dry.  Neurological:     Mental Status: She is alert and oriented to person, place, and time.     Labs on Admission: I have personally reviewed following labs and imaging studies  CBC: Recent Labs  Lab 07/09/20 2132  WBC 6.2  NEUTROABS 5.3  HGB 9.3*  HCT 29.8*  MCV 87.4  PLT 256   Basic Metabolic Panel: Recent Labs  Lab 07/09/20 2132  NA 140  K 3.3*  CL 104  CO2 26  GLUCOSE 98  BUN <5*  CREATININE 0.59  CALCIUM 8.6*  MG 2.1  PHOS 4.0   GFR: CrCl cannot be calculated (Unknown ideal weight.). Liver Function Tests: Recent Labs  Lab 07/09/20 2132  AST 14*  ALT 15  ALKPHOS 56  BILITOT 0.8  PROT 5.4*  ALBUMIN 3.1*   No results for input(s): LIPASE, AMYLASE in the last 168 hours. No results for input(s): AMMONIA in the last 168 hours. Coagulation Profile: No results for input(s): INR, PROTIME in the last 168 hours. Cardiac Enzymes: No results for input(s): CKTOTAL, CKMB, CKMBINDEX, TROPONINI in the last 168 hours. BNP (last 3 results) No results for input(s): PROBNP in the last 8760 hours. HbA1C: No results for input(s): HGBA1C in the last 72 hours. CBG: No results for input(s): GLUCAP in the last 168 hours. Lipid Profile: No results for input(s): CHOL, HDL, LDLCALC, TRIG, CHOLHDL, LDLDIRECT in the last 72 hours. Thyroid  Function Tests: Recent Labs    07/09/20 2132  TSH 0.365   Anemia Panel: No results for input(s): VITAMINB12, FOLATE, FERRITIN, TIBC, IRON,  RETICCTPCT in the last 72 hours. Urine analysis:    Component Value Date/Time   COLORURINE STRAW (A) 07/09/2020 2254   APPEARANCEUR CLEAR 07/09/2020 2254   LABSPEC 1.006 07/09/2020 2254   PHURINE 9.0 (H) 07/09/2020 2254   GLUCOSEU NEGATIVE 07/09/2020 2254   HGBUR NEGATIVE 07/09/2020 2254   BILIRUBINUR NEGATIVE 07/09/2020 2254   KETONESUR NEGATIVE 07/09/2020 2254   PROTEINUR NEGATIVE 07/09/2020 2254   NITRITE NEGATIVE 07/09/2020 2254   LEUKOCYTESUR NEGATIVE 07/09/2020 2254    Radiological Exams on Admission: CT ABDOMEN PELVIS WO CONTRAST  Result Date: 07/09/2020 CLINICAL DATA:  Pain around a tube site. EXAM: CT ABDOMEN AND PELVIS WITHOUT CONTRAST TECHNIQUE: Multidetector CT imaging of the abdomen and pelvis was performed following the standard protocol without IV contrast. COMPARISON:  None. FINDINGS: Lower chest: The lung bases are clear. The heart size is normal. The intracardiac blood pool is hypodense relative to the adjacent myocardium consistent with anemia. Hepatobiliary: The liver is normal. Status post cholecystectomy.There is no biliary ductal dilation. Pancreas: Normal contours without ductal dilatation. No peripancreatic fluid collection. Spleen: Unremarkable. Adrenals/Urinary Tract: --Adrenal glands: Unremarkable. --Right kidney/ureter: There appears to be a nonobstructing stone in the interpolar region of the right kidney. --Left kidney/ureter: No hydronephrosis or radiopaque kidney stones. --Urinary bladder: Unremarkable. Stomach/Bowel: --Stomach/Duodenum: There is a gastrojejunostomy tube in place. The retention bulb has retracted and now resides within the subcutaneous soft tissues. Repositioning is recommended. The tube is otherwise well position with the tip terminating in the proximal jejunum. --Small bowel: Unremarkable. --Colon:  Unremarkable. --Appendix: Normal. Vascular/Lymphatic: Normal course and caliber of the major abdominal vessels. --No retroperitoneal lymphadenopathy. --No mesenteric lymphadenopathy. --No pelvic or inguinal lymphadenopathy. Reproductive: Both ovaries appear slightly enlarged with multiple follicles or cysts, not well evaluated in the absence of IV contrast. Other: There is a small volume of pelvic free fluid which is likely physiologic. No free air. The abdominal wall is normal. Musculoskeletal. No acute displaced fractures. IMPRESSION: 1. There is a gastrojejunostomy tube in place. The retention bulb has retracted and now resides within the subcutaneous soft tissues. Repositioning is recommended. The tube is otherwise well positioned with the tip terminating in the proximal jejunum. 2. Both ovaries appear slightly enlarged with multiple follicles or cysts, not well evaluated in the absence of IV contrast. Consider PCOS. 3. Nonobstructive right nephrolithiasis. 4. Anemia. Electronically Signed   By: Katherine Mantlehristopher  Green M.D.   On: 07/09/2020 22:47   DG Chest Portable 1 View  Result Date: 07/09/2020 CLINICAL DATA:  Shortness of breath EXAM: PORTABLE CHEST 1 VIEW COMPARISON:  None. FINDINGS: The heart size and mediastinal contours are within normal limits. A right-sided MediPort catheter seen with the tip at the superior cavoatrial junction. Both lungs are clear. The visualized skeletal structures are unremarkable. IMPRESSION: No active disease. Electronically Signed   By: Jonna ClarkBindu  Avutu M.D.   On: 07/09/2020 19:23    Assessment/Plan Principal Problem:   Feeding tube dysfunction Active Problems:   Protein calorie malnutrition (HCC)   Myasthenia gravis (HCC)   Cervical spine instability   Dysautonomia (HCC)   Protein calorie malnutrition Concern for G-J-tube malfunction Patient has multiple allergies to medicines and her diet is very restricted due to intolerance of many foods. She has difficulty  maintaining her nutrition. She feeds p.o. and via G-J-tube. Feeding tube insertion site without obvious signs of infection or drainage. There are gastric contents within the feeding tube. CT showing G-J-tube in place, however, the retention bulb has retracted and now resides within the  subcutaneous soft tissues. This is likely what is causing her discomfort and needs to be repositioned. -Consult IR in the morning. Nutrition consult placed.  Myasthenia gravis with concern for flare -Neurology has been consulted and recommendations pending. Will place PT/OT consult and care management consult as she will require home health care and assistive devices.  Cervical instability: Secondary to Ehlers-Danlos and myasthenia gravis. -Continue c-collar  Mast cell activation syndrome Asthma Multiple chemical sensitivity syndrome -Resume home meds after pharmacy med rec is done. Outpatient follow-[facility] allergist at [facility].  POTS/dysautonomia -Liberal sodium intake. Outpatient follow-up with her cardiologist at [facility].  PTSD Anxiety Depression -Pharmacy med rec pending  Abnormal CT finding: CT showing slightly enlarged ovaries with multiple follicles or cysts, not well visualized in the absence of IV contrast, findings suspicious for possible PCOS. -Outpatient gynecology follow-up.  Mild hypokalemia: Potassium 3.3. Magnesium level normal. -Replete potassium and continue to monitor electrolytes  DVT prophylaxis: SCDs at this time Code Status: Full code Family Communication: No family available at this time. Disposition Plan: Status is: Observation  The patient remains OBS appropriate and will d/c before 2 midnights.  Dispo: The patient is from: Home              Anticipated d/c is to: Home              Anticipated d/c date is: 1 day              Patient currently is not medically stable to d/c.  The medical decision making on this patient was of high complexity and the patient is at high risk  for clinical deterioration, therefore this is a level 3 visit.  John Giovanni MD [facility]  If 7PM-7AM, please contact night-coverage [email or web address]  07/10/2020, 1:20 AM

## 2020-07-10 NOTE — ED Notes (Signed)
Pt severely anxious on assessment. Pt refused to let me put a pulse ox on her. Keeps removing blood pressure cuff. At one moment states she wants to leave AMA, next she says she just needs a minute. Requesting help with taking care of her son who is at home with a babysitter that will be leaving today. Pt unable to get sedation for IR procedure at this time due to unknown status for patients son. Pt hyperventilating requesting social work come see her right now. Explained to patient consult has been placed by admitting MD. Pt medicated per protocol.

## 2020-07-10 NOTE — Progress Notes (Signed)
On my entering the room, the patient was very agitated and anxious regarding the delay in her discharge due to needed procedure for her J-tube.  She was hyperventilating, and throughout the episode her voice remains strong, her breathing remained strong, with no evidence of fatigability.  At this time, I do not think she has any active myasthenia issues.  Neurology will be available on an as-needed basis, continue her home medications at this time.  Ritta Slot, MD [facility] [phone]  If 7pm- 7am, please page neurology on call as listed in AMION.

## 2020-07-10 NOTE — ED Notes (Signed)
Pt upset that she did not at home meds scheduled. RN to call pharmacy and request meds. PT took at home meds that she had brought with her in order to stay on schedule.

## 2020-07-10 NOTE — Progress Notes (Signed)
Patient is refusing telemetry at this time.

## 2020-07-10 NOTE — Care Management (Signed)
ED CM contacted [facility] to check on PCS services and was made aware that they are waiting on patient to select agency.  CM met with patient at bedside and provided number to contact [facility] to start the services patient verbalizes understanding but states she does not have the list here with her encouraged her to do as soon as she is able to do so. No further ED RN CM needs identified.

## 2020-07-10 NOTE — ED Notes (Signed)
MD Rai notified about pt wanting to leave. MD Rai also notified that pt is requesting home meds.

## 2020-07-10 NOTE — ED Notes (Signed)
Attempted to call report, unsuccessful, will call back. 

## 2020-07-10 NOTE — ED Notes (Addendum)
Pt requesting update, MD notified. Pt's J tube is not in correct location and will need to stay for procedure tomorrow. Informed pt, pt has multiple complaints regarding her medications, diet, treatment and not wanting to stay. Pt very vocal, crying, shaking in bed, and hitting self in head. Pt states she needs to get home to her child and that we do not have food here to accommodate her needs. Neuro MD at bedside. After discussion with MD, pt agrees to stay. Pt then on phone each time this RN enters to discuss medication orders. Will continue to monitor, admissions notified that pt will need bed for tonight.

## 2020-07-10 NOTE — Progress Notes (Signed)
Patient is requesting and increase in Zofran from 4mg  to 8 mg.  Patient is requesting an increase of Ativan to 1 mg.  Patient has extensive medication list that she wants corrected NOW.  She is high anxious and stated that they promised her in the emergency room that she would get her medications and at the times she need it.  Corlanor 5mg  tab 6am and 6pm  Zafirlukast 20 mg tab 6am and 6pm  Famotidine 40 mg tab 6am and 6pm  Levocetirizine 5mg  (x2) 6am and 6pm  Baclofen 10mg  4x per day 6am, 12pm, 6pm, 12 am  Prednisone 25 mg PO 6am  Mestinon 90mg  every 3 hours already listed on [facility]

## 2020-07-10 NOTE — ED Notes (Signed)
Pt to CT scan.

## 2020-07-10 NOTE — ED Notes (Addendum)
In CT (previous comment from 1530)

## 2020-07-10 NOTE — Progress Notes (Signed)
Patient has arrived to 5N22, she is extremely, grossly anxious.  Everything that staff is doing is causing her high anxiety.  She has complained non stop and has all kinds of demands in regards to everything including medications at the top of her list.

## 2020-07-10 NOTE — Progress Notes (Addendum)
2:50  CSW received call from foster sister stating that she is concerned about child welfare and that she has place a CPS report.       CSW met with at bedside along with CSW Wiley.  Pt states that she is working to get PCS services in home and has filled out an application. ToC Team will attempt to set up HH services for Pt.  Pt has a child at home whom she left with a neighbor, Pt is concerned that she will have child care issues if admitted, wishes to be discharged due to these issues.  CSW expained to Pt that medical needs must be met and if she is admitted CSW will then address issues of possible childcare needs.   CSW staffed case with Clinical Supervisor who agrees with CSW  That this CSW has no evidence that Child Protective Services report is necessary at this time.  

## 2020-07-10 NOTE — Progress Notes (Signed)
RT had pt perform VC with decent effort and best of 1000 ml. RT will continue to monitor.

## 2020-07-10 NOTE — Discharge Planning (Signed)
Pt has been declined by several home health agencies due to high pt demands.  RNCM will provide private pay resources.

## 2020-07-10 NOTE — ED Notes (Signed)
Attempted report 

## 2020-07-10 NOTE — ED Notes (Signed)
PT getting visibly upset after being told she would be transferred to a room. Pt stating that she needs to get home to see her son and that she does not have childcare. This RN counseled pt, about the need to stay and encouraged pt to find additional childcare. PT still stating that she would like to leave via taxi.

## 2020-07-10 NOTE — Progress Notes (Signed)
SLP Cancellation Note  Patient Details Name: Brandy [name] MRN: [id number] DOB: December 20, 1990   Cancelled treatment:       Reason Eval/Treat Not Completed: Other (comment) Pt was admitted secondary to G-tube infection. Pt was last seen by this SLP on 05/17/20 due to myasthenic exacerbation. A soft diet was recommended at that time. However, pt stated that she cannot/does not eat hospital foods due to potential mast cell activation. Pt stated at that time that she typically brings her own "safe" food to the hospital or orders "safe" groceries from [place] to be delivered to the hospital. SLP was consulted for swallowing evaluation on 07/10/20. Pt's case was discussed with the referring MD, Dr. Isidoro Donning, who indicated that the pt is requesting SLP services via Home Health. It was agreed that completion of a bedside swallow evaluation will be deferred and that orders for Home Health SLP services will be placed at the time of discharge.   Lanesha Azzaro I. Vear Clock, MS, CCC-SLP Acute Rehabilitation Services Office number [phone] Pager [phone]  Scheryl Marten 07/10/2020, 2:25 PM

## 2020-07-10 NOTE — Progress Notes (Signed)
Patient obtained 1.58L with a great effort on the vital capacity.
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
## 2020-07-10 NOTE — Progress Notes (Addendum)
2:24pm-This CSW and 2nd shift CSW followed up with pt at bedside. 2nd shift ED CSW to continue to assist with further needs of pt at this time. CSW also provided ED CSW with contact information for pt's sister That was sent to CSW in secure chat.   This CSW went to speak with pt at bedside however CSW was asked by staff to return at a later time.  Claude Manges Cordelia Bessinger, MSW, LCSW [facility] [phone]

## 2020-07-10 NOTE — ED Notes (Signed)
Pt refused VS  

## 2020-07-10 NOTE — H&P (Signed)
Chief Complaint: Malpositioned GJ tube. Buried bumper. Request is for GJ tube reposition  Referring Physician(s): Dr. Isidoro Donning  Supervising Physician: Gilmer Mor  Patient Status: [facility] - In-pt  History of Present Illness: Brandy [name] is a 30 y.o. female history of Ehlers-Danlos syndrome with cervical instability, with cervical collar, mast cell activation syndrome, POTS, fibromyalgia, PTSD, anxiety and depression s/p GJ tube  MIC 22 Fr placed at OSH. Patient presented to the ED at [facility] with concern for an infected GJ tube. CT Abd pelvis reads There is a gastrojejunostomy tube in place. The retention bulb has retracted and now resides within the subcutaneous soft tissues. Repositioning is recommended. The tube is otherwise well position with the tip terminating in the proximal jejunum. Team is requesting GJ tube reposition.    Past Medical History:  Diagnosis Date  . Myasthenia gravis [facility])     Past Surgical History:  Procedure Laterality Date  . IR REMOVAL TUN CV CATH W/O FL  06/03/2020    Allergies: Aspirin, Nsaids, Quinolones, Sulfa antibiotics, Telithromycin, Fludrocortisone, Ambien [zolpidem], Amitriptyline, Beef-potatoes-spinach [compleat], Botulinum toxins, Chlorhexidine, Ciprofloxacin, Codeine, Contrast media [iodinated diagnostic agents], Creon [pancrelipase (lip-prot-amyl)], Cymbalta [duloxetine hcl], Dextrans, Ergotamine, Fluoxetine, Gabapentin, Gallamine, Hydrocodone, Iodine, Lactose intolerance (gi), Levofloxacin, Lunesta [eszopiclone], Macrolides and ketolides, Maprotiline, Marplan [isocarboxazid], Midodrine, Morphine and related, Nitrous oxide, Oxycodone, Pethidine [meperidine], Phenergan [promethazine], Phenobarbital, Pyridium [phenazopyridine], Reglan [metoclopramide], Remeron [mirtazapine], Salicylates, Savella [milnacipran], Scopolamine, Sumatriptan, Tape, Topiramate, Tricyclic antidepressants, Tums [calcium carbonate], Vancomycin, and  Verapamil  Medications: Prior to Admission medications   Medication Sig Start Date End Date Taking? Authorizing Provider  albuterol (VENTOLIN HFA) 108 (90 Base) MCG/ACT inhaler Inhale into the lungs every 6 (six) hours as needed for wheezing or shortness of breath.   Yes [provider]  baclofen (LIORESAL) 10 MG tablet Take 10 mg by mouth See admin instructions. Take one 10 mg tablet four times a day at 6AM, 12PM, 6PM, and 12AM   Yes [provider]  CORLANOR 5 MG TABS tablet Take 5 mg by mouth 2 (two) times daily. 6am,6pm 05/28/20  Yes [provider]  cromolyn (GASTROCROM) 100 MG/5ML solution Take 100 mg by mouth See admin instructions. 2 ampule in water taken 4 times a day before meals/bed and PRN   Yes [provider]  cromolyn (INTAL) 20 MG/2ML nebulizer solution Take 20 mg by nebulization See admin instructions. 1 vial taken 3 times a day and PRN   Yes [provider]  diphenhydrAMINE-zinc acetate (BENADRYL) cream Apply 1-2 application topically daily as needed (Rash from Mast Cell and Allergies).   Yes [provider]  EPINEPHrine 0.3 mg/0.3 mL IJ SOAJ injection Inject 0.3 mg into the muscle as needed for anaphylaxis.   Yes [provider]  famotidine (PEPCID) 40 MG tablet Take 40 mg by mouth See admin instructions. Take one 40 mg tablet twice a day at 6AM and 6PM.   Yes [provider]  Fluticasone-Salmeterol (ADVAIR) 100-50 MCG/DOSE AEPB Inhale 1 puff into the lungs in the morning and at bedtime. 6am and 6pm 06/01/20  Yes [provider]  ipratropium-albuterol (DUONEB) 0.5-2.5 (3) MG/3ML SOLN Inhale 3 mLs into the lungs See admin instructions. Qid and prn   Yes [provider]  ketotifen (ZADITOR) 0.025 % ophthalmic solution Place 1 drop into both eyes daily as needed (Mast cell, Allergies, MCS/EI).   Yes [provider]  levocetirizine (XYZAL) 5 MG tablet Take 10 mg by mouth in the morning and  at bedtime. 6am,6pm  06/01/20  Yes [provider]  LORazepam (ATIVAN) 0.5 MG tablet Take 0.5 mg by mouth every 6 (six) hours as needed for anxiety.   Yes [provider]  ondansetron (ZOFRAN-ODT) 8 MG disintegrating tablet Take 8 mg by mouth every 8 (eight) hours as needed for nausea or vomiting.   Yes [provider]  Potassium Citrate 99 MG CAPS Take 99 mg by mouth daily. 6 or 9pm   Yes [provider]  predniSONE (DELTASONE) 5 MG tablet Take 5 tablets (25 mg total) by mouth daily with breakfast. Patient taking differently: Take 25 mg by mouth daily with breakfast. 6am 05/22/20  Yes Lewie Chamber, MD  pyridostigmine (MESTINON) 60 MG tablet Take 1.5 tablets (90 mg total) by mouth every 3 (three) hours. Patient taking differently: Take 90 mg by mouth every 3 (three) hours. 12am,3am,6am,9am,12pm,3pm,6pm,9pm 05/21/20  Yes Lewie Chamber, MD  vitamin B-12 (CYANOCOBALAMIN) 1000 MCG tablet Take 1,000 mcg by mouth daily. 6 or 9pm   Yes [provider]  Vitamin D, Ergocalciferol, (DRISDOL) 1.25 MG (50000 UNIT) CAPS capsule Take 50,000 Units by mouth 2 (two) times a week.   Yes [provider]  zafirlukast (ACCOLATE) 20 MG tablet Take 20 mg by mouth 2 (two) times daily before a meal. 6am 6pm 02/17/20  Yes [provider]     Family History  Adopted: Yes    Social History   Socioeconomic History  . Marital status: Single    Spouse name: Not on file  . Number of children: Not on file  . Years of education: Not on file  . Highest education level: Not on file  Occupational History  . Not on file  Tobacco Use  . Smoking status: Former Games developer  . Smokeless tobacco: Never Used  Substance and Sexual Activity  . Alcohol use: Not on file  . Drug use: Not on file  . Sexual activity: Not on file  Other Topics Concern  . Not on file  Social History Narrative  . Not on file   Social Determinants of Health   Financial Resource Strain: Not  on file  Food Insecurity: Not on file  Transportation Needs: Not on file  Physical Activity: Not on file  Stress: Not on file  Social Connections: Not on file    Review of Systems: A 12 point ROS discussed and pertinent positives are indicated in the HPI above.  All other systems are negative.  Review of Systems  Constitutional: Negative for fatigue and fever.  HENT: Negative for congestion.   Respiratory: Negative for cough and shortness of breath.   Gastrointestinal: Positive for abdominal pain ( leaking around G tube ). Negative for diarrhea, nausea and vomiting.    Vital Signs: BP 121/79   Pulse 80   Temp 98.2 F (36.8 C) (Oral)   Resp 16   Ht 5\' 4"  (1.626 m)   Wt 108 lb 0.4 oz (49 kg)   SpO2 95%   BMI 18.54 kg/m   Physical Exam Vitals and nursing note reviewed.  Constitutional:      Appearance: She is well-developed.  HENT:     Head: Normocephalic and atraumatic.  Eyes:     Conjunctiva/sclera: Conjunctivae normal.  Pulmonary:     Effort: Pulmonary effort is normal.  Musculoskeletal:        General: Normal range of motion.     Cervical back: Normal range of motion.  Skin:    General: Skin is warm.  Neurological:  Mental Status: She is alert and oriented to person, place, and time.     Imaging: CT ABDOMEN PELVIS WO CONTRAST  Result Date: 07/09/2020 CLINICAL DATA:  Pain around a tube site. EXAM: CT ABDOMEN AND PELVIS WITHOUT CONTRAST TECHNIQUE: Multidetector CT imaging of the abdomen and pelvis was performed following the standard protocol without IV contrast. COMPARISON:  None. FINDINGS: Lower chest: The lung bases are clear. The heart size is normal. The intracardiac blood pool is hypodense relative to the adjacent myocardium consistent with anemia. Hepatobiliary: The liver is normal. Status post cholecystectomy.There is no biliary ductal dilation. Pancreas: Normal contours without ductal dilatation. No peripancreatic fluid collection. Spleen: Unremarkable.  Adrenals/Urinary Tract: --Adrenal glands: Unremarkable. --Right kidney/ureter: There appears to be a nonobstructing stone in the interpolar region of the right kidney. --Left kidney/ureter: No hydronephrosis or radiopaque kidney stones. --Urinary bladder: Unremarkable. Stomach/Bowel: --Stomach/Duodenum: There is a gastrojejunostomy tube in place. The retention bulb has retracted and now resides within the subcutaneous soft tissues. Repositioning is recommended. The tube is otherwise well position with the tip terminating in the proximal jejunum. --Small bowel: Unremarkable. --Colon: Unremarkable. --Appendix: Normal. Vascular/Lymphatic: Normal course and caliber of the major abdominal vessels. --No retroperitoneal lymphadenopathy. --No mesenteric lymphadenopathy. --No pelvic or inguinal lymphadenopathy. Reproductive: Both ovaries appear slightly enlarged with multiple follicles or cysts, not well evaluated in the absence of IV contrast. Other: There is a small volume of pelvic free fluid which is likely physiologic. No free air. The abdominal wall is normal. Musculoskeletal. No acute displaced fractures. IMPRESSION: 1. There is a gastrojejunostomy tube in place. The retention bulb has retracted and now resides within the subcutaneous soft tissues. Repositioning is recommended. The tube is otherwise well positioned with the tip terminating in the proximal jejunum. 2. Both ovaries appear slightly enlarged with multiple follicles or cysts, not well evaluated in the absence of IV contrast. Consider PCOS. 3. Nonobstructive right nephrolithiasis. 4. Anemia. Electronically Signed   By: Katherine Mantle M.D.   On: 07/09/2020 22:47   DG Chest Portable 1 View  Result Date: 07/09/2020 CLINICAL DATA:  Shortness of breath EXAM: PORTABLE CHEST 1 VIEW COMPARISON:  None. FINDINGS: The heart size and mediastinal contours are within normal limits. A right-sided MediPort catheter seen with the tip at the superior cavoatrial  junction. Both lungs are clear. The visualized skeletal structures are unremarkable. IMPRESSION: No active disease. Electronically Signed   By: Jonna Clark M.D.   On: 07/09/2020 19:23    Labs:  CBC: Recent Labs    05/26/20 1500 05/28/20 0215 06/03/20 1521 07/09/20 2132  WBC 4.8 6.2 4.3 6.2  HGB 9.1* 9.3* 9.5* 9.3*  HCT 29.3* 29.0* 31.4* 29.8*  PLT 205 201 252 256    COAGS: Recent Labs    05/28/20 0215  INR 1.8*  APTT 43*    BMP: Recent Labs    05/28/20 0215 06/03/20 1521 07/09/20 2132 07/10/20 0644  NA 144 140 140 143  K 2.7* 3.7 3.3* 3.2*  CL 109 105 104 105  CO2 26 28 26 25   GLUCOSE 96 121* 98 89  BUN <5* <5* <5* <5*  CALCIUM 8.3* 8.7* 8.6* 8.6*  CREATININE 0.61 0.58 0.59 0.61  GFRNONAA >60 >60 >60 >60    LIVER FUNCTION TESTS: Recent Labs    05/21/20 0500 05/28/20 0215 06/03/20 1521 07/09/20 2132  BILITOT 2.2* 1.2 1.2 0.8  AST 11* 15 17 14*  ALT 10 11 19 15   ALKPHOS 21* 17* 40 56  PROT 4.5* 4.2* 5.2*  5.4*  ALBUMIN 3.7 3.8 3.7 3.1*    Assessment and Plan:  30 y.o. female. History of Ehlers-Danlos syndrome with cervical instability, with cervical collar, mast cell activation syndrome, POTS, fibromyalgia, PTSD, anxiety and depression s/p GJ tube  MIC 22 Fr placed at OSH. [facility] with concern for an infected GJ tube. CT Abd pelvis reads There is a gastrojejunostomy tube in place. The retention bulb has retracted and now resides within the subcutaneous soft tissues. Repositioning is recommended. The tube is otherwise well position with the tip terminating in the proximal jejunum. Team is requesting GJ tube reposition.   Patient seen at bedside expressing concern about her admission into the hospital. Brandy [name] is reporting  has childcare issues and is concerned for the safety of her son with a stranger. Brandy [name] is discussing the possibility of her son being dropped off at the hospital. Patient states that she does not want to e  admitted and would like to go home to care for her son. Furthermore Brandy. Claris [name] has a motorized wheelchair and is being transported by a wheelchair service which would limit the sedation given if she is to leave AMA. The existing GJ  tube appears to be  Functional. Upon assessment the Patient is hooked up to a kangaroo pump with no alarm sounding. After discussion with IR Attending due to the present social issues recommend that patient's existing GJ tube be repositioned at bedside with diagnostic confirmation post reposition.  Although the patient would prefer to be sedated and then leave AMA post procedure she is in agreement with plan of care.   The existing GJ tube repositioned at bedside. 8 ml of saline was removed and the balloon retention was deflated.  Using a stiff amplatz wire the tube was reposition approximately 5 cm further into the stomach. The balloon retention was filled back with 8 ml of normal saline. Per the Patient's preference the flange was adjusted by the patient herself with possible further repositioning.  Due to patient's allergy to contrast and reported inability to take oral contrast a CT limited abdomen was ordered for position confirmation. Ct abd limited shoes the gastrostomy tube in good position but the jejonostomy has flipped into the stomach. Patient to be scheduled for GJ tube exchange with sedation and premedication on 1.22.21.   IR consulted for possible GJ tube exchange. Case has been reviewed and procedure approved by Dr. Loreta AveWagner / Miles CostainShick.  Patient tentatively scheduled for 1.22.21.  Team instructed to: Keep Patient to be NPO after midnight Hold prophylactic anticoagulation 24 hours prior to scheduled procedure. Prednisone to be given 13 hours, 7 hours and 1 hour prior to procedure.   IR will call patient when ready.  Thank you for this interesting consult.  I greatly enjoyed meeting Brandy [name] and look forward to participating in their care.  A copy of this  report was sent to the requesting provider on this date.  Electronically Signed: Alene MiresJennifer C Chad Tiznado, NP 07/10/2020, 9:09 AM   I spent a total of 40 Minutes    in face to face in clinical consultation, greater than 50% of which was counseling/coordinating care for GJ tube malposition

## 2020-07-10 NOTE — ED Notes (Signed)
Pt assisted on bedpan.

## 2020-07-10 NOTE — Progress Notes (Addendum)
Patient seen and examined, admitted by Dr. Loney Loh this morning.  Briefly 30 year old female with history of myasthenia gravis, Ehlers-Danlos syndrome with cervical instability, with cervical collar, mast cell activation syndrome, POTS, fibromyalgia, PTSD, anxiety and depression, severe PCM, recently moved from [place] presented with abdominal pain and presumed Gtube infection and dislodgment. Patient was last admitted at [facility] 11/27-12/3 for myasthenia gravis flare, received plasmapheresis. Her hospitalization was complicated with severe anxiety. Neurology recommended that patient would be best served in a tertiary care center with multiple specialties available as her medical diagnoses are rather complex. Per patient, she was established with PCP but was also recommended to be established with [facility] tertiary care centers. Patient had an appointment with Dr. Nita Sickle, neurology on 1/19, however patient insisted on virtual new patient appointment for MG and was recommended to reschedule for another time for in person appointment  BP 129/80   Pulse 65   Temp 98.2 F (36.8 C) (Oral)   Resp 16   Ht 5\' 4"  (1.626 m)   Wt 49 kg   SpO2 100%   BMI 18.54 kg/m    Agree with assessment and plan per Dr. . Neurology was consulted by EDP, reviewed neurology recommendations for admission  Concern for G-tube malfunction/displacement -Patient on examination is tearful and wants the G-tube to be replaced or repositioned correctly asap so she can leave as she is having childcare issues -I consulted interventional radiology, discussed with Dr. Loney Loh, spoke to IR nursing, will expedite the procedure. I explained to the patient that we will do everything to help her and expedite, however the procedure and timing is not under our control given there are ongoing procedures.  Myasthenia gravis with concern for flare -Neurology recommendations reviewed, continue prednisone, baclofen,  pyridostigmine -Patient does not want to stay in the hospital. All the recommendations by neurology were explained to her. She wants PT OT, SLP, SW, outpatient referral to neurology arranged -Again recommended patient that she will need PT evaluation before I can put in home health orders. PT evaluation recommended home health PT as she declines SNF. -Home health ordered, [facility] consulted for needs. -Again she was referred to outpatient neurology with Dr. [facility]. Patient has to call the office to reschedule her appointment. She states that she has another PCP appointment in February2021  Mast cell activation syndrome, asthma -Outpatient medications resumed  POTS/dysautonomia -Follow-up with her cardiologist at [facility]   Patient wants to be discharged home after her G-tube is replaced or repositioned. States she has all her medications at home except running out of Ativan. Will follow-up after IR evaluation for G-tube.   [facility] M.D.  [facility] 07/10/2020, 10:47 AM    Addendum I was notified by IR that gastrostomy tube was successfully repositioned however her J portion has since flipped into her stomach and will need exchange. The procedure is now scheduled tomorrow morning at [facility] as patient needs to be premedicated with prednisone due to contrast allergy. She will be NPO after MN.   Patient can be discharged tomorrow after the procedure. She needs to call [facility] (neurology) to reschedule her appointment for follow-up. HH orders have been placed.   Allena Katz M.D.  [facility] 07/10/2020, 7:04 PM

## 2020-07-11 ENCOUNTER — Observation Stay (HOSPITAL_COMMUNITY): Payer: Medicare Other

## 2020-07-11 DIAGNOSIS — T85598A Other mechanical complication of other gastrointestinal prosthetic devices, implants and grafts, initial encounter: Secondary | ICD-10-CM

## 2020-07-11 DIAGNOSIS — K9423 Gastrostomy malfunction: Secondary | ICD-10-CM | POA: Diagnosis not present

## 2020-07-11 HISTORY — PX: IR GJ TUBE CHANGE: IMG1440

## 2020-07-11 MED ORDER — IVABRADINE HCL 5 MG PO TABS
5.0000 mg | ORAL_TABLET | ORAL | Status: DC
  Administered 2020-07-11 (×2): 5 mg via ORAL
  Filled 2020-07-11 (×3): qty 1

## 2020-07-11 MED ORDER — LORAZEPAM 1 MG PO TABS: 1.0000 mg | ORAL_TABLET | Freq: Four times a day (QID) | ORAL | Status: DC | PRN

## 2020-07-11 MED ORDER — BACLOFEN 10 MG PO TABS
10.0000 mg | ORAL_TABLET | ORAL | 0 refills | Status: AC
Start: 2020-07-11 — End: ?

## 2020-07-11 MED ORDER — ONDANSETRON 4 MG PO TBDP
8.0000 mg | ORAL_TABLET | ORAL | Status: DC | PRN
  Administered 2020-07-11: 8 mg via ORAL
  Filled 2020-07-11: qty 2

## 2020-07-11 MED ORDER — METHYLPREDNISOLONE SODIUM SUCC 125 MG IJ SOLR
125.0000 mg | Freq: Once | INTRAMUSCULAR | Status: AC
  Administered 2020-07-11: 125 mg via INTRAVENOUS
  Filled 2020-07-11: qty 2

## 2020-07-11 MED ORDER — PYRIDOSTIGMINE BROMIDE 60 MG PO TABS: 90.0000 mg | ORAL_TABLET | ORAL | 0 refills | Status: AC

## 2020-07-11 MED ORDER — TETRACAINE HCL 1 % IJ SOLN
100.0000 mg | Freq: Once | INTRAMUSCULAR | Status: AC
  Administered 2020-07-11: 10 mL
  Filled 2020-07-11: qty 10

## 2020-07-11 MED ORDER — CORLANOR 5 MG PO TABS: 5.0000 mg | ORAL_TABLET | Freq: Two times a day (BID) | ORAL | 0 refills | Status: AC

## 2020-07-11 MED ORDER — LIDOCAINE HCL 1 % IJ SOLN
INTRAMUSCULAR | Status: AC
  Filled 2020-07-11: qty 20

## 2020-07-11 MED ORDER — LORAZEPAM 2 MG/ML IJ SOLN
0.5000 mg | Freq: Once | INTRAMUSCULAR | Status: AC
  Administered 2020-07-11: 0.5 mg via INTRAVENOUS
  Filled 2020-07-11: qty 1

## 2020-07-11 MED ORDER — ONDANSETRON 4 MG PO TBDP
4.0000 mg | ORAL_TABLET | Freq: Once | ORAL | Status: AC
  Administered 2020-07-11: 4 mg via ORAL
  Filled 2020-07-11: qty 1

## 2020-07-11 MED ORDER — LORAZEPAM 0.5 MG PO TABS: 0.5000 mg | ORAL_TABLET | Freq: Four times a day (QID) | ORAL | 0 refills | Status: DC | PRN

## 2020-07-11 MED ORDER — ZAFIRLUKAST 20 MG PO TABS
20.0000 mg | ORAL_TABLET | Freq: Two times a day (BID) | ORAL | 0 refills | Status: AC
Start: 2020-07-11 — End: ?

## 2020-07-11 MED ORDER — FENTANYL CITRATE (PF) 100 MCG/2ML IJ SOLN
INTRAMUSCULAR | Status: AC
  Filled 2020-07-11: qty 4

## 2020-07-11 MED ORDER — FAMOTIDINE IN NACL 20-0.9 MG/50ML-% IV SOLN
20.0000 mg | Freq: Once | INTRAVENOUS | Status: AC
  Administered 2020-07-11: 20 mg via INTRAVENOUS
  Filled 2020-07-11: qty 50

## 2020-07-11 MED ORDER — TETRACAINE HCL 1 % IJ SOLN
10.0000 mg | Freq: Once | INTRAMUSCULAR | Status: DC
  Filled 2020-07-11: qty 2

## 2020-07-11 MED ORDER — VITAMIN B-12 1000 MCG PO TABS
1000.0000 ug | ORAL_TABLET | Freq: Every day | ORAL | Status: DC
  Administered 2020-07-11: 1000 ug via ORAL
  Filled 2020-07-11: qty 1

## 2020-07-11 MED ORDER — DIPHENHYDRAMINE HCL 50 MG/ML IJ SOLN
50.0000 mg | Freq: Once | INTRAMUSCULAR | Status: AC
  Administered 2020-07-11: 50 mg via INTRAVENOUS
  Filled 2020-07-11: qty 1

## 2020-07-11 MED ORDER — PYRIDOSTIGMINE BROMIDE 60 MG PO TABS
90.0000 mg | ORAL_TABLET | ORAL | Status: DC
  Administered 2020-07-11 (×4): 90 mg via ORAL
  Filled 2020-07-11 (×8): qty 1.5

## 2020-07-11 MED ORDER — TETRACAINE HCL 1 % IJ SOLN
100.0000 mg | Freq: Once | INTRAMUSCULAR | Status: DC
  Filled 2020-07-11: qty 10

## 2020-07-11 MED ORDER — MIDAZOLAM HCL 2 MG/2ML IJ SOLN
INTRAMUSCULAR | Status: AC
  Filled 2020-07-11: qty 6

## 2020-07-11 MED ORDER — BACLOFEN 10 MG PO TABS
10.0000 mg | ORAL_TABLET | ORAL | Status: DC
  Administered 2020-07-11 (×3): 10 mg via ORAL
  Filled 2020-07-11 (×2): qty 1

## 2020-07-11 MED ORDER — MIDAZOLAM HCL 2 MG/2ML IJ SOLN
INTRAMUSCULAR | Status: AC | PRN
  Administered 2020-07-11 (×4): 0.5 mg via INTRAVENOUS

## 2020-07-11 NOTE — Procedures (Signed)
Interventional Radiology Procedure Note  Procedure: Replacement of gastrojejunal feeding tube under fluoroscopy  Complications: None  Estimated Blood Loss: None  Findings: New 22 Fr balloon retention GJ tube advanced over wire into proximal jejunum. OK to use immediately.  Jodi Marble. Fredia Sorrow, M.D Pager:  [phone]

## 2020-07-11 NOTE — Progress Notes (Signed)
Pt refused to take her Mestinon. Stated" it was due at 9 pm and I already took my own". Explained to pt that she must not take her home meds while in the hossiptal and that the nurse would not be able to get to each pt at the exact time meds are prescribed as there are others who get meds at the same time and there is a window of time that meds are given. Patient stated "I have to have mine at 9pm."

## 2020-07-11 NOTE — Discharge Instructions (Signed)
Thank you for letting us care for you during your stay.  You were admitted to the [facility]  You were admitted for nutritional tub malfunction .  We found the following during your tube was malpositioned and this was fixed--it is good for immediate use.   Other important diagnoses identified during your stay were low potassium--which improved--please resume you usual home medications and your specific diet--please adhere to your special dietary needs .  Return if severe pain, fevers, vomiting, or other concerning symptoms    Please follow up with your primary care physician in 1 week.   If your symptoms worsen or return, please return to the hospital.  Please let us know if you have questions about your stay at [facility].      [employer] Resources  [facility] Address: [address], [place], [place] [address] Phone: [phone]  [facility] Address: [address] [place], [place] [address] Phone: [phone]  [facility] Address: [address] [place], [place], [place] [address] Phone: [phone]  [email or web address]  [facility] [place] [phone]

## 2020-07-11 NOTE — Discharge Summary (Signed)
Physician Discharge Summary  Brandy [name] MVH:[id number] DOB: 1990/07/17 DOA: 07/09/2020  PCP: Brandy [name], Brandy B, MD  Admit date: 07/09/2020 Discharge date: 07/11/2020  Admitted From: home  Disposition:  Home with home health   Recommendations for Outpatient Follow-up:  1. Follow up with PCP in 1-2 weeks 2. Please obtain BMP/CBC in one week 3. Please follow up on the following pending results:  Home Health:[facility]   Equipment/Devices:Power tub seat and long shower handle   Discharge Condition:Stable  CODE STATUS:FULL   Diet recommendation: Per patient allergies/preferences   Brief/Interim Summary:  Dr. Gardiner Rhymeathore's admission history: Brandy [name] is a 30 y.o. 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  History limited as patient was wearing a respirator and speaking softly which made it difficult to understand her speech.  Reports noticing drainage and redness around her feeding tube site.  She has not used the feeding tube for several days since this problem started and there is tenderness around the feeding tube site.  Patient states she is currently taking Mestinon and prednisone for her myasthenia gravis and is concerned that it is not being managed properly.  She is feeling weak and having some difficulty speaking and shortness of breath intermittently.  States she tried to make an appointment with a neurologist here but they did not allow a virtual visit.  She is hesitant going into the clinic as they use scent diffusers/air fresheners.   States she was seen by her primary care physician who then referred her to another practice as her case was felt to be complex.  It was difficult to understand the patient but it seems she has not had primary care visits since then.  She did mention an upcoming appointment next month.  [facility] was also [facility]  ED course was notable for mild hypokalemia and CT abdomen pelvis with retracted bulb of her gastrojejunostomy tube.  Feeding Tube Issue after appropriate pretreatment with steroids the patient had change of her tube with replacement of her gastrojejunostomy tube in appropriate position for immediate use.  The patient also has a port in place which she requested we leave access that she uses for IV fluids, risks were discussed it does appear that the patient uses this for intravenous fluids at home per her most recent allergy note.  Possible Myasthenia Gravis Flare evaluated by neurology and found not to have a flare.  She continued on her usual medications and was discharged in stable condition doing well from the standpoint.  Of note the patient additionally has a dysautonomia that affects her ability to walk and she is in an electric wheelchair at baseline.  Social stressor the patient has an 29-year-old son and she recently obtained custody of him.  During her hospital stay she had difficulty with childcare and notes such requested to leave AGAINST MEDICAL ADVICE multiple times.  We discussed this and we are able to meet her needs and have her tube fixed with appropriate safe discharge.  She will need electrolyte follow-up in the near future for hypokalemia.  She uses intravenous fluids at home to maintain her hydration and her electrolyte status. She declined labs and  medications on day of discharge.   The patient had ovarian cysts that were noted on imaging and primary can consider  gynecology evaluation as an outpatient. Pregnancy test negative.   Normocytic anemia also  noted during hospital stay and recommend repeat as an outpatient as well.  Likely due to malnutrition.   Of note the patient uses home oxygen, an electric wheelchair and has a number of medications which she prefers to be given at very specific times in the future recommend timing to use immediately upon admission to reduce any anxiety related to timing of medications.  Discharge Diagnoses:  Principal Problem:   Feeding tube dysfunction Active Problems:   Protein calorie malnutrition (HCC)   Myasthenia gravis (HCC)   Cervical spine instability   Dysautonomia (HCC)    Discharge Instructions  Discharge Instructions    Call MD for:  hives   Complete by: As directed    Call MD for:  redness, tenderness, or signs of infection (pain, swelling, redness, odor or green/yellow discharge around incision site)   Complete by: As directed    Call MD for:  severe uncontrolled pain   Complete by: As directed    Face-to-face encounter (required for Medicare/Medicaid patients)   Complete by: As directed    I Westley Chandler certify that this patient is under my care and that I, or a nurse practitioner or physician's assistant working with me, had a face-to-face encounter that meets the physician face-to-face encounter requirements with this patient on 07/11/2020. The encounter with the patient was in whole, or in part for the following medical condition(s) which is the primary reason for home health care (List medical condition): [name], POTS   The encounter with the patient was in whole, or in part, for the following medical condition, which is the primary reason for home health care: malnutrition, POTS   I certify that, based on my findings, the following services are medically necessary home health services:  Nursing Physical therapy     Reason for Medically Necessary Home Health Services: Therapy- Home [facility] to Facilitate Safety   My clinical findings support the need for the above  services: Unable to leave home safely without assistance and/or assistive device   Further, I certify that my clinical findings support that this patient is homebound due to: Unable to leave home safely without assistance   For home use only DME Other see comment   Complete by: As directed    Power bath life   Length of Need: 12 Months   For home use only DME Other see comment   Complete by: As directed    Long shower handle   Length of Need: 12 Months   Home Health   Complete by: As directed    To provide the following care/treatments:  PT OT RN Social work     Increase activity slowly   Complete by: As directed      Allergies as of 07/11/2020      Reactions   Aspirin    Nsaids    Quinolones    Sulfa Antibiotics    Telithromycin    Fludrocortisone    Ambien [zolpidem]    Amitriptyline    Beef-potatoes-spinach [compleat]    Botulinum Toxins    Chlorhexidine Hives   Ciprofloxacin    Codeine    Contrast Media [iodinated Diagnostic Agents]    Creon [pancrelipase (lip-prot-amyl)]    Cymbalta [duloxetine Hcl]    Dextrans    Ergotamine  Fluoxetine    All SSRIs All SSRIs   Gabapentin    Gallamine    Hydrocodone    Iodine    Lactose Intolerance (gi)    Levofloxacin    Lunesta [eszopiclone]    Macrolides And Ketolides    Maprotiline    Marplan [isocarboxazid]    Any MAOI contraindicated   Midodrine    Morphine And Related    Nitrous Oxide    Oxycodone    Pethidine [meperidine]    Phenergan [promethazine]    Phenobarbital    Pyridium [phenazopyridine]    Reglan [metoclopramide]    Remeron [mirtazapine]    Noradrenergic antagonist   Salicylates    Savella [milnacipran]    Scopolamine    Sumatriptan    Tape    Topiramate    Tricyclic Antidepressants    Tums [calcium Carbonate]    Patient reports allergic.  RN to find out adverse reaction and update allergy field.    Vancomycin    Verapamil       Medication List    TAKE these medications    albuterol 108 (90 Base) MCG/ACT inhaler Commonly known as: VENTOLIN HFA Inhale into the lungs every 6 (six) hours as needed for wheezing or shortness of breath.   baclofen 10 MG tablet Commonly known as: LIORESAL Take 1 tablet (10 mg total) by mouth See admin instructions. Take one 10 mg tablet four times a day at 6AM, 12PM, 6PM, and 12AM   Corlanor 5 MG Tabs tablet Generic drug: ivabradine Take 1 tablet (5 mg total) by mouth 2 (two) times daily. 6am,6pm   cromolyn 100 MG/5ML solution Commonly known as: GASTROCROM Take 100 mg by mouth See admin instructions. 2 ampule in water taken 4 times a day before meals/bed and PRN   cromolyn 20 MG/2ML nebulizer solution Commonly known as: INTAL Take 20 mg by nebulization See admin instructions. 1 vial taken 3 times a day and PRN   diphenhydrAMINE-zinc acetate cream Commonly known as: BENADRYL Apply 1-2 application topically daily as needed (Rash from Mast Cell and Allergies).   EPINEPHrine 0.3 mg/0.3 mL Soaj injection Commonly known as: EPI-PEN Inject 0.3 mg into the muscle as needed for anaphylaxis.   famotidine 40 MG tablet Commonly known as: PEPCID Take 40 mg by mouth See admin instructions. Take one 40 mg tablet twice a day at 6AM and 6PM.   Fluticasone-Salmeterol 100-50 MCG/DOSE Aepb Commonly known as: ADVAIR Inhale 1 puff into the lungs in the morning and at bedtime. 6am and 6pm   ipratropium-albuterol 0.5-2.5 (3) MG/3ML Soln Commonly known as: DUONEB Inhale 3 mLs into the lungs See admin instructions. Qid and prn   ketotifen 0.025 % ophthalmic solution Commonly known as: ZADITOR Place 1 drop into both eyes daily as needed (Mast cell, Allergies, MCS/EI).   levocetirizine 5 MG tablet Commonly known as: XYZAL Take 10 mg by mouth in the morning and at bedtime. 6am,6pm   LORazepam 0.5 MG tablet Commonly known as: ATIVAN Take 1 tablet (0.5 mg total) by mouth every 6 (six) hours as needed for anxiety.   ondansetron 8 MG  disintegrating tablet Commonly known as: ZOFRAN-ODT Take 8 mg by mouth every 8 (eight) hours as needed for nausea or vomiting.   Potassium Citrate 99 MG Caps Take 99 mg by mouth daily. 6 or 9pm   predniSONE 5 MG tablet Commonly known as: DELTASONE Take 5 tablets (25 mg total) by mouth daily with breakfast. What changed: additional instructions   pyridostigmine 60 MG  tablet Commonly known as: MESTINON Take 1.5 tablets (90 mg total) by mouth every 3 (three) hours. 12am,3am,6am,9am,12pm,3pm,6pm,9pm   vitamin [name]-12 1000 MCG tablet Commonly known as: CYANOCOBALAMIN Take 1,000 mcg by mouth daily. 6 or 9pm   Vitamin D (Ergocalciferol) 1.25 MG (50000 UNIT) Caps capsule Commonly known as: DRISDOL Take 50,000 Units by mouth 2 (two) times a week.   zafirlukast 20 MG tablet Commonly known as: ACCOLATE Take 1 tablet (20 mg total) by mouth 2 (two) times daily before a meal. 6am 6pm            Durable Medical Equipment  (From admission, onward)         Start     Ordered   07/11/20 0000  For home use only DME Other see comment       Comments: Power bath life  Question:  Length of Need  Answer:  12 Months   07/11/20 1341   07/11/20 0000  For home use only DME Other see comment       Comments: Long shower handle  Question:  Length of Need  Answer:  12 Months   07/11/20 1341          Allergies  Allergen Reactions  . Aspirin   . Nsaids   . Quinolones   . Sulfa Antibiotics   . Telithromycin   . Fludrocortisone   . Ambien [Zolpidem]   . Amitriptyline   . Beef-Potatoes-Spinach [Compleat]   . Botulinum Toxins   . Chlorhexidine Hives  . Ciprofloxacin   . Codeine   . Contrast Media [Iodinated Diagnostic Agents]   . Creon [Pancrelipase (Lip-Prot-Amyl)]   . Cymbalta [Duloxetine Hcl]   . Dextrans   . Ergotamine   . Fluoxetine     All SSRIs All SSRIs   . Gabapentin   . Gallamine   . Hydrocodone   . Iodine   . Lactose Intolerance (Gi)   . Levofloxacin   . Lunesta  [Eszopiclone]   . Macrolides And Ketolides   . Maprotiline   . Marplan [Isocarboxazid]     Any MAOI contraindicated  . Midodrine   . Morphine And Related   . Nitrous Oxide   . Oxycodone   . Pethidine [Meperidine]   . Phenergan [Promethazine]   . Phenobarbital   . Pyridium [Phenazopyridine]   . Reglan [Metoclopramide]   . Remeron [Mirtazapine]     Noradrenergic antagonist  . Salicylates   . Savella [Milnacipran]   . Scopolamine   . Sumatriptan   . Tape   . Topiramate   . Tricyclic Antidepressants   . Tums [Calcium Carbonate]     Patient reports allergic.  RN to find out adverse reaction and update allergy field.   . Vancomycin   . Verapamil     Consultations:  Neurology- signed off  IR    Procedures/Studies: CT ABDOMEN PELVIS WO CONTRAST  Result Date: 07/09/2020 CLINICAL DATA:  Pain around a tube site. EXAM: CT ABDOMEN AND PELVIS WITHOUT CONTRAST TECHNIQUE: Multidetector CT imaging of the abdomen and pelvis was performed following the standard protocol without IV contrast. COMPARISON:  None. FINDINGS: Lower chest: The lung bases are clear. The heart size is normal. The intracardiac blood pool is hypodense relative to the adjacent myocardium consistent with anemia. Hepatobiliary: The liver is normal. Status post cholecystectomy.There is no biliary ductal dilation. Pancreas: Normal contours without ductal dilatation. No peripancreatic fluid collection. Spleen: Unremarkable. Adrenals/Urinary Tract: --Adrenal glands: Unremarkable. --Right kidney/ureter: There appears to be a nonobstructing stone  in the interpolar region of the right kidney. --Left kidney/ureter: No hydronephrosis or radiopaque kidney stones. --Urinary bladder: Unremarkable. Stomach/Bowel: --Stomach/Duodenum: There is a gastrojejunostomy tube in place. The retention bulb has retracted and now resides within the subcutaneous soft tissues. Repositioning is recommended. The tube is otherwise well position with the tip  terminating in the proximal jejunum. --Small bowel: Unremarkable. --Colon: Unremarkable. --Appendix: Normal. Vascular/Lymphatic: Normal course and caliber of the major abdominal vessels. --No retroperitoneal lymphadenopathy. --No mesenteric lymphadenopathy. --No pelvic or inguinal lymphadenopathy. Reproductive: Both ovaries appear slightly enlarged with multiple follicles or cysts, not well evaluated in the absence of IV contrast. Other: There is a small volume of pelvic free fluid which is likely physiologic. No free air. The abdominal wall is normal. Musculoskeletal. No acute displaced fractures. IMPRESSION: 1. There is a gastrojejunostomy tube in place. The retention bulb has retracted and now resides within the subcutaneous soft tissues. Repositioning is recommended. The tube is otherwise well positioned with the tip terminating in the proximal jejunum. 2. Both ovaries appear slightly enlarged with multiple follicles or cysts, not well evaluated in the absence of IV contrast. Consider PCOS. 3. Nonobstructive right nephrolithiasis. 4. Anemia. Electronically Signed   By: Katherine Mantle M.D.   On: 07/09/2020 22:47   IR GJ Tube Change  Result Date: 07/11/2020 INDICATION: Displaced balloon retention gastrojejunal feeding tube which has retracted completely into the stomach. EXAM: EXCHANGE OF GASTROJEJUNAL FEEDING TUBE UNDER FLUOROSCOPY MEDICATIONS: 2.0 mg IV Versed ANESTHESIA/SEDATION: Formal moderate IV conscious sedation was not administered and the patient received Versed for anxiety. CONTRAST:  10 mL Omnipaque 300-administered into the gastric lumen. FLUOROSCOPY TIME:  Fluoroscopy Time: 3 minutes and 42 seconds. 9.1 mGy. COMPLICATIONS: None immediate. PROCEDURE: Informed written consent was obtained from the patient after a thorough discussion of the procedural risks, benefits and alternatives. All questions were addressed. Maximal Sterile Barrier Technique was utilized including caps, mask, sterile  gowns, sterile gloves, sterile drape, hand hygiene and skin antiseptic. A timeout was performed prior to the initiation of the procedure. The pre-existing gastrojejunal tube was removed after deflation of the retention balloon. A 5 French catheter was introduced via the exit site into the stomach and further advanced to the level of the proximal jejunum over a hydrophilic guidewire. A new 22 French balloon retention gastrojejunal tube was advanced over the wire. Catheter position was confirmed by fluoroscopy after contrast injection. The retention balloon was inflated with 8 mL of saline. FINDINGS: The gastrojejunal tube coiled in the stomach was removed. A new catheter was advanced such that the jejunal lumen tip lies well into the proximal jejunum. The tube is ready for immediate use. IMPRESSION: Exchange of malpositioned gastrojejunal feeding tube with placement of new 22 French balloon retention gastrojejunal tube to the level of the proximal jejunum. Electronically Signed   By: Irish Lack M.D.   On: 07/11/2020 12:26   CT ABDOMEN LIMITED WO CONTRAST  Result Date: 07/10/2020 CLINICAL DATA:  Check gastrojejunostomy catheter placement EXAM: CT ABDOMEN WITHOUT CONTRAST LIMITED TECHNIQUE: Multidetector CT imaging of the abdomen was performed following the standard protocol without IV contrast. COMPARISON:  CT from the previous day. FINDINGS: Limited scanning of the abdomen was performed for evaluation of the gastrostomy tube with jejunal tail. The gastrostomy catheter balloon has been deflated and the gastrostomy portion of the catheter advanced into the stomach. The balloon has been reinflated and the balloon could be withdrawn slightly to singe against the anterior aspect of gastric wall. The jejunal tail is now  predominately coiled within the stomach although the tip appears to lie within the region of the duodenal bulb significantly withdrawn when compared with the prior exam. IMPRESSION: Interval  advancement of gastrostomy catheter into the stomach. This could be withdrawn slightly with the balloon inflated to singe against anterior aspect of the gastric wall. The jejunal tail has withdrawn and now all predominately coiled within the stomach with the tip lying within the region of the duodenal bulb on the scout film. Electronically Signed   By: Alcide CleverMark  Lukens M.D.   On: 07/10/2020 15:56   DG Chest Portable 1 View  Result Date: 07/09/2020 CLINICAL DATA:  Shortness of breath EXAM: PORTABLE CHEST 1 VIEW COMPARISON:  None. FINDINGS: The heart size and mediastinal contours are within normal limits. A right-sided MediPort catheter seen with the tip at the superior cavoatrial junction. Both lungs are clear. The visualized skeletal structures are unremarkable. IMPRESSION: No active disease. Electronically Signed   By: Jonna ClarkBindu  Avutu M.D.   On: 07/09/2020 19:23       Subjective:   Discharge Exam: Vitals:   07/11/20 1155 07/11/20 1219  BP: 124/80 122/70  Pulse: (!) 57 60  Resp: (!) 26 20  Temp:  98 F (36.7 C)  SpO2: 100% 95%   Vitals:   07/11/20 1145 07/11/20 1150 07/11/20 1155 07/11/20 1219  BP: 124/87 127/81 124/80 122/70  Pulse: (!) 58 (!) 58 (!) 57 60  Resp: (!) 21 18 (!) 26 20  Temp:    98 F (36.7 C)  TempSrc:    Oral  SpO2: 100% 100% 100% 95%  Weight:      Height:        General: Pt is alert, awake, not in acute distress, thin appearing, port C/D/I  Cardiovascular: RRR, S1/S2 +, no rubs, no gallops Respiratory: CTA bilaterally, no wheezing, no rhonchi Abdominal: Soft, NT, ND, bowel sounds +, abdomen with dressing in place, GJ tube intact  Extremities: no edema, no cyanosis    The results of significant diagnostics from this hospitalization (including imaging, microbiology, ancillary and laboratory) are listed below for reference.     Microbiology: Recent Results (from the past 240 hour(s))  SARS Coronavirus 2 by RT PCR (hospital order, performed in [facility]  hospital lab) Nasopharyngeal Nasopharyngeal Swab     Status: None   Collection Time: 07/09/20  9:32 PM   Specimen: Nasopharyngeal Swab  Result Value Ref Range Status   SARS Coronavirus 2 NEGATIVE NEGATIVE Final    Comment: (NOTE) SARS-CoV-2 target nucleic acids are NOT DETECTED.  The SARS-CoV-2 RNA is generally detectable in upper and lower respiratory specimens during the acute phase of infection. The lowest concentration of SARS-CoV-2 viral copies this assay can detect is 250 copies / mL. A negative result does not preclude SARS-CoV-2 infection and should not be used as the sole basis for treatment or other patient management decisions.  A negative result may occur with improper specimen collection / handling, submission of specimen other than nasopharyngeal swab, presence of viral mutation(s) within the areas targeted by this assay, and inadequate number of viral copies (<250 copies / mL). A negative result must be combined with clinical observations, patient history, and epidemiological information.  Fact Sheet for Patients:   BoilerBrush.com.cyhttps://www.fda.gov/media/136312/download  Fact Sheet for Healthcare Providers: https://pope.com/https://www.fda.gov/media/136313/download  This test is not yet approved or  cleared by the [place] FDA and has been authorized for detection and/or diagnosis of SARS-CoV-2 by FDA under an Emergency Use Authorization (EUA).  This EUA  will remain in effect (meaning this test can be used) for the duration of the COVID-19 declaration under Section 564([name])(1) of the Act, 21 U.S.C. section 360bbb-3([name])(1), unless the authorization is terminated or revoked sooner.  Performed at [facility], [address], [place], [place] [address]   Group A Strep by PCR     Status: None   Collection Time: 07/09/20 11:53 PM   Specimen: Throat; Sterile Swab  Result Value Ref Range Status   Group A Strep by PCR NOT DETECTED NOT DETECTED Final    Comment: Performed at [facility], [address] [place], [place] [address]     Labs: BNP (last 3 results) No results for input(s): BNP in the last 8760 hours. Basic Metabolic Panel: Recent Labs  Lab 07/09/20 2132 07/10/20 0644  NA 140 143  K 3.3* 3.2*  CL 104 105  CO2 26 25  GLUCOSE 98 89  BUN <5* <5*  CREATININE 0.59 0.61  CALCIUM 8.6* 8.6*  MG 2.1  --   PHOS 4.0  --    Liver Function Tests: Recent Labs  Lab 07/09/20 2132  AST 14*  ALT 15  ALKPHOS 56  BILITOT 0.8  PROT 5.4*  ALBUMIN 3.1*   No results for input(s): LIPASE, AMYLASE in the last 168 hours. No results for input(s): AMMONIA in the last 168 hours. CBC: Recent Labs  Lab 07/09/20 2132  WBC 6.2  NEUTROABS 5.3  HGB 9.3*  HCT 29.8*  MCV 87.4  PLT 256   Cardiac Enzymes: No results for input(s): CKTOTAL, CKMB, CKMBINDEX, TROPONINI in the last 168 hours. BNP: Invalid input(s): POCBNP CBG: No results for input(s): GLUCAP in the last 168 hours. D-Dimer No results for input(s): DDIMER in the last 72 hours. Hgb A1c No results for input(s): HGBA1C in the last 72 hours. Lipid Profile No results for input(s): CHOL, HDL, LDLCALC, TRIG, CHOLHDL, LDLDIRECT in the last 72 hours. Thyroid function studies Recent Labs    07/09/20 2132  TSH 0.365   Anemia work up No results for input(s): VITAMINB12, FOLATE, FERRITIN, TIBC, IRON, RETICCTPCT in the last 72 hours. Urinalysis    Component Value Date/Time   COLORURINE STRAW (A) 07/09/2020 2254   APPEARANCEUR CLEAR 07/09/2020 2254   LABSPEC 1.006 07/09/2020 2254   PHURINE 9.0 (H) 07/09/2020 2254   GLUCOSEU NEGATIVE 07/09/2020 2254   HGBUR NEGATIVE 07/09/2020 2254   BILIRUBINUR NEGATIVE 07/09/2020 2254   KETONESUR NEGATIVE 07/09/2020 2254   PROTEINUR NEGATIVE 07/09/2020 2254   NITRITE NEGATIVE 07/09/2020 2254   LEUKOCYTESUR NEGATIVE 07/09/2020 2254   Sepsis Labs Invalid input(s): PROCALCITONIN,  WBC,  LACTICIDVEN Microbiology Recent Results (from the past 240 hour(s))  SARS  Coronavirus 2 by RT PCR (hospital order, performed in [facility] hospital lab) Nasopharyngeal Nasopharyngeal Swab     Status: None   Collection Time: 07/09/20  9:32 PM   Specimen: Nasopharyngeal Swab  Result Value Ref Range Status   SARS Coronavirus 2 NEGATIVE NEGATIVE Final    Comment: (NOTE) SARS-CoV-2 target nucleic acids are NOT DETECTED.  The SARS-CoV-2 RNA is generally detectable in upper and lower respiratory specimens during the acute phase of infection. The lowest concentration of SARS-CoV-2 viral copies this assay can detect is 250 copies / mL. A negative result does not preclude SARS-CoV-2 infection and should not be used as the sole basis for treatment or other patient management decisions.  A negative result may occur with improper specimen collection / handling, submission of specimen other  than nasopharyngeal swab, presence of viral mutation(s) within the areas targeted by this assay, and inadequate number of viral copies (<250 copies / mL). A negative result must be combined with clinical observations, patient history, and epidemiological information.  Fact Sheet for Patients:   BoilerBrush.com.cy  Fact Sheet for Healthcare Providers: https://pope.com/  This test is not yet approved or  cleared by the [place] FDA and has been authorized for detection and/or diagnosis of SARS-CoV-2 by FDA under an Emergency Use Authorization (EUA).  This EUA will remain in effect (meaning this test can be used) for the duration of the COVID-19 declaration under Section 564([name])(1) of the Act, 21 U.S.C. section 360bbb-3([name])(1), unless the authorization is terminated or revoked sooner.  Performed at [facility], [address], [place], [place] [address]   Group A Strep by PCR     Status: None   Collection Time: 07/09/20 11:53 PM   Specimen: Throat; Sterile Swab  Result Value Ref Range Status   Group A Strep by PCR NOT  DETECTED NOT DETECTED Final    Comment: Performed at [facility], [address] [place], [place] [address]     Time coordinating discharge: Over 30 minutes  SIGNED:   Bonner Puna. Manson Passey,  MD  [facility] 07/11/2020, 5:20 PM Pager [phone]   If 7PM-7AM, please contact night-coverage [email or web address] Password [email or web address]

## 2020-07-11 NOTE — Progress Notes (Signed)
Pt refused to get her chest port deaccessed. She stated that she came in from home with the port already accessed, so she doesn't want anyone touching it. She also states that she has a home health RN that comes in every few days to give her fluids through the port. I notified the MD and she was fine with leaving the port in.

## 2020-07-11 NOTE — Progress Notes (Signed)
Patient performed vital capacity with good effort  VC .8L

## 2020-07-11 NOTE — Progress Notes (Signed)
Patient had multiple episodes of anxiety attacks to where patient smacked herself multiple times in the head along with pulling her hair out. Patient arrived to floor extremely mad due to the ED not providing her with her appropriate medication per patient  (dose and time wise). Jasmine December RN paged on on-call doctor, on-call doctor stated the on coming attending for day shift will handle the meds. I too paged [facility] for a few meds patient mentioned she needed by morning, did not get a response. Around 0730 patient found at the door on the floor. Patient snapped IV tubing in half and was smacking herself in the head Patient stated she was not hurt, just her incision site from where she believed her tubing was infected. Patient placed back in the bed, no complaints of head injury nor body injury. Morning meds establish with pharm and attending.

## 2020-07-11 NOTE — Progress Notes (Signed)
Two nurses went into give patient her 0000 medications and her respiratory meds that was past due.  Patient has very specific requests for taking meds and has her own mask for neb treatment.  Patient c/o that we turned the oxygen up too high for the neb treatment.  Then she started hitting her self in the head again and started throwing items from her table at staff.  This nurse will not enter room without another staff member present because of her unstable and reckless behavior.  Patient continues to refuse telemetry, she refuse to allow staff to do a head to toe skin assessment, she refuses to answer admission questions.  She just wants whatever medications she wants and to be left alone.

## 2020-07-11 NOTE — Progress Notes (Signed)
Pt refused VC for the AM. Will attempt at a later time.

## 2020-07-11 NOTE — Progress Notes (Signed)
Patient did not want to be touched, RT unable to obtain vitals with treatments.

## 2020-07-11 NOTE — Progress Notes (Signed)
Patient discharged to home and transported by [facility]. Pt is stable and voices no complaints.

## 2020-07-11 NOTE — TOC Transition Note (Signed)
Transition of Care [facility]) - CM/SW Discharge Note   Patient Details  Name: Brandy [name] MRN: [id number] Date of Birth: 14-Jul-1990  Transition [facility]) CM/SW Contact:  Kallie Locks, RN Phone Number: [phone] 07/11/2020, 4:04 PM   Clinical Narrative:   Received message from MD that patient has transition needs. Reviewed chart. ED RN case managers addressed patient's home health needs on 07/10/20. Unable to find home health agency to accept. Patient reminded of this.  Provided patient with [facility] number to find new PCP. Has PCP appointment with [facility] on Feb. 1st per patient. [name] states she will contact [facility] again for [facility] agency choice.  States she is anxious to return home to her son.   PTAR contacted at 1546 pm. Patient and nursing made aware.  PTAR form in shadow chart. Nursing made aware.  No further needs assessed.             Patient Goals and CMS Choice        Discharge Placement                       Discharge Plan and Services                                     Social Determinants of Health (SDOH) Interventions     Readmission Risk Interventions No flowsheet data found.   Raiford Noble, MSN, RN,BSN [facility] Case Manager [phone]

## 2020-07-11 NOTE — Progress Notes (Signed)
Pt performed VC with good effort.   VC: 1.3L

## 2020-07-11 NOTE — Progress Notes (Signed)
Patient has belongings in the room, multiple bags of materials, and medications, and not sure what else.  She refuses to go through what medications she has at the bedside. She refuses any kind of assessment, she refuses admission inquiries. She refuses telemetry that is ordered.  As mentioned in an earlier note, patient just wants her medications how she see fit and for staff to leave her alone or she starts nagging, crying, temple tantrums, and hitting herself in the head, throwing items at staff.

## 2020-07-12 ENCOUNTER — Telehealth: Payer: Self-pay | Admitting: Family Medicine

## 2020-07-12 MED ORDER — LORAZEPAM 0.5 MG PO TABS: 0.5000 mg | ORAL_TABLET | Freq: Four times a day (QID) | ORAL | 0 refills | Status: DC | PRN

## 2020-07-12 NOTE — Progress Notes (Addendum)
07/12/20 @1100  I got a call from this pt that she did not have the Lorazepam script. Per pt, the night nurse was not able to find the Ativan script upon discharge.  I found the Ativan script at the discharge medical record bin at [facility]. I paged and spoke to Dr and she will do an electronic script to pt's pharmacy. I shredded the Ativan script.

## 2020-07-12 NOTE — Telephone Encounter (Signed)
Received call from nurse that patient's printed ativan Rx was not given to her. It is at desk at [facility]N. Nursing to shred document, file SZP as this was given to unit coordinator to file.   Rx to pharmacy electronically, PMP reviewed.  Terisa Starr, MD  Family Medicine Teaching Service

## 2020-07-13 ENCOUNTER — Telehealth: Payer: Self-pay

## 2020-07-13 NOTE — Telephone Encounter (Signed)
Transition Care Management Unsuccessful Follow-up Telephone Call  Date of discharge and from where:  07/11/2020, [facility]   Attempts:  1st Attempt  Reason for unsuccessful TCM follow-up call:  Unable to leave message - call placed to # [phone] x 3 and each time, the message stated that the call cannot be completed at this time.   Need to inform the patient that Dr Laural Benes wants to remind her that it is important that she keep the appointment 07/21/2020 @ [facility] to establish care with new PCP.

## 2020-07-14 ENCOUNTER — Telehealth: Payer: Self-pay

## 2020-07-14 NOTE — Telephone Encounter (Signed)
Patient returned call and would like a call back. She can be reached at [phone]. Please advise

## 2020-07-14 NOTE — Telephone Encounter (Signed)
Call returned to patient and explained that the nurse case manager suggested she contact Interventional Radiology at the hospital for guidance regarding her j tube. The patient had concerns about her neck brace that was broken in the hospital. Instructed her to contact Patient Experience about this issue.  She then said that she spoke to the home care company about [facility] services.  They will need to have her sign documents prior to starting services.  She also is concerned about getting a neurologist that will see her virtually because of her multiple fragrance allergies. She is hoping she will be able to find one at [facility]

## 2020-07-14 NOTE — Telephone Encounter (Signed)
Transition Care Management Follow-up Telephone Call  Date of discharge and from where: 07/11/2020, [facility]  Explained to her that Dr Laural Benes wants to remind her that she needs to keep the appointment with the PCP at [facility] that is scheduled for 07/21/2020 and she said she plans to keep it. This is very important not to miss this appointment.  She said that her j-tube has been leaking and she didn't know who to contact. She explained that she left messages for the case manager that assisted her at the hospital # [phone] but she has not heard back.  Informed her that this CM would contact the hospital CM and inquire who she should reach out to . She said that she contacted [facility] and they would like her to find someone in [place] to follow her ; but she has not yet been able to find one.  The patient is aware that the hospital was not able to identify a home health agency to provide PT/OT for her.  The patient said that her insurance is changing 07/21/2020.  This CM instructed her to call her insurance company at that time to inquire what agency(s) are in network with her insurer. She will need to  discuss the services needed with her new PCP.   She said that she has been contacted about [facility] and will be receiving 2-3 hours/day but she needs to contact the agency again that she has chosen to provide her services.   She also reported that the hospital broke her neck brace.  Instructed her to contact [facility]. She said she has tried to contact them , but has not been able to speak to anyone.  Besides the broken neck brace, she spoke about multiple problems that occurred during her hospital stay - not receiving medications as ordered, not being able to leave the hospital, violating her patient rights. Again, this CM encouraged her to continue to try to contact [facility].    Call placed to Raiford Noble, RN CM.  She suggested that the patient contact Interventional  Radiology for the issue with her feeding tube.

## 2020-07-14 NOTE — Telephone Encounter (Incomplete)
Transition Care Management Follow-up Telephone Call  Date of discharge and from where: 07/11/2020, [facility]  How have you been since you were released from the hospital? ***  Any questions or concerns? {YES/NO TOC TRACKING USE FOR MANAGED MEDICAID REPORTING:[id number]}  Items Reviewed:  Did the pt receive and understand the discharge instructions provided? {YES/NO:21197}  Medications obtained and verified? {YES/NO:21197}  Other? {YES/NO:21197}  Any new allergies since your discharge? {YES/NO:21197}  Dietary orders reviewed? {YES/NO TITLE CASE:22902}  Do you have support at home? {YES/NO:21197}  Home Care and Equipment/Supplies: Were home health services ordered? {Response; yes/no/na:63} If so, what is the name of the agency? ***  Has the agency set up a time to come to the patient's home? {Response; yes/no/na:63} Were any new equipment or medical supplies ordered?  {QVZ/DG:[id number]} What is the name of the medical supply agency? *** Were you able to get the supplies/equipment? {Response; yes/no/na:63} Do you have any questions related to the use of the equipment or supplies? {RJJ/OA:416606301}  Functional Questionnaire: (I = Independent and D = Dependent) ADLs: ***  Bathing/Dressing- ***  Meal Prep- ***  Eating- ***  Maintaining continence- ***  Transferring/Ambulation- ***  Managing Meds- ***  Follow up appointments reviewed:   PCP Hospital f/u appt confirmed? {YES/NO:21197} Scheduled to see *** on *** @ ***.  Specialist Hospital f/u appt confirmed? {YES/NO:21197} Scheduled to see *** on *** @ ***.  Are transportation arrangements needed? {YES/NO:21197}  If their condition worsens, is the pt aware to call PCP or go to the Emergency Dept.? {YES/NO TITLE CASE:22902}  Was the patient provided with contact information for the PCP's office or ED? {YES/NO TITLE CASE:22902}  Was to pt encouraged to call back with questions or concerns? {YES/NO TITLE  CASE:22902}

## 2020-07-17 LAB — THIOPURINE METHYLTRANSFERASE (TPMT), RBC: TPMT Activity:: 19.5 Units/mL RBC

## 2020-07-22 ENCOUNTER — Ambulatory Visit (HOSPITAL_COMMUNITY): Payer: Medicaid Other | Admitting: Professional

## 2020-07-23 ENCOUNTER — Other Ambulatory Visit: Payer: Self-pay

## 2020-07-23 ENCOUNTER — Ambulatory Visit (INDEPENDENT_AMBULATORY_CARE_PROVIDER_SITE_OTHER): Payer: 59 | Admitting: Family Medicine

## 2020-07-23 DIAGNOSIS — E43 Unspecified severe protein-calorie malnutrition: Secondary | ICD-10-CM

## 2020-07-23 NOTE — Patient Instructions (Signed)
Dietary Recommendations remain the same: - Eat beyond discomfort as appropriate.  Always ask yourself, "Can I take one more bite?"  Aim for getting some foods by mouth every day, using tube feeding as needed to add to your intake (once it is replaced).     - Be as consistent as possible with respect to eating times each day.  At scheduled eating times, even when you don't feel like eating, can you take one bite?    - Plan tomorrow's first foods of the day, and do whatever prep you can do the night before.    - Start eating as early in the day as possible.  You may have best success with a cup of hot broth or tea (if you can find one that is well tolerated) first thing in the morning to help get your GI system functioning.     - Consume small, frequent meals throughout the day, being as upright as possible after eating.  Aim for eating something no more than 4 hours apart, even if it is a small portion, e.g., 7 AM, 10 AM, 2 PM, 5 PM, 8 PM.    Follow-up: Watch for an email with available appt times.

## 2020-07-23 NOTE — Progress Notes (Signed)
Telehealth Encounter (TEXT link [phone] ) PCP Loni Muse, MD  I connected with Larkin Alfred (MRN [id number]) on 07/23/2020 by MyChart video-enabled, HIPAA-compliant telemedicine application, verified that I was speaking with the correct person using two identifiers, and that the patient was in a private environment conducive to confidentiality.  The patient agreed to proceed.   Persons participating in visit were patient and provider (registered dietitian) Kennith Center, PhD, RD, LDN, CEDRD.  Provider was located at home during this telehealth encounter; patient was at home.  Appt start time: 1430 end time: 1500 (30 minutes)  Reason for telehealth visit: Referred upon hospital discharge by RD Larkin Ina for Medical Nutrition Therapy related to food sensitivities and eating behavior concerns.  Relevant history/background: Medical hx includes:  . Asthma  . Cervical spine instability  . Chronic PTSD  . Dysautonomia    . Fibromyalgia  . GERD with hiatal hernia  . Hypermobile Ehlers-Danlos syndrome  . IBS (irritable bowel syndrome)  . Malabsorption  . 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 Myasthenia gravis . POTS (postural orthostatic tachycardia syndrome)  . Seasonal allergies  . Severe protein-calorie malnutrition  Cardiologist Harriette Bouillon, MD provided a provisional dx on 06/14/20 of dysautonomia probably secondary to neurocardiogenic syncope vs. POTS.  For a comprehensive list of symptoms, see Dr. Gwenyth Bender encounter notes from 06/16/20.  Symptoms most relevant to nutrition concerns include nausea, diarrhea, constipation, heart burn, abdominal bloating and cramping, feeling full quickly after eating, difficulty swallowing, weight loss, and bad breath.  Due to fatigue, light-headedness, and syncope, patient is wheelchair-bound.  Ms. Chambers reports numerous food allergies/intolerances in addition to the 42 drug-related allergies  documented in EMR.   Ms. Jhene Westmoreland RD Larkin Ina during her hospitalization in November 2021.  Nutrition concerns at that time included severe malnutrition, confirmed by labs showing low K+, Ca+, BUN, total protein, alk phos, AST, hgb, hct, ferritin, RBC, vit's A and D, and Cu.  WNR were vit's B6, B12, folate, thiamin, vit C, and Zn.  Ms. Rieger had in place a J-tube, but used primarily a G-tube for tube feeding while hospitalized, which she continues at home.  She rejected all tube feeding options on the hospital's formulary (including [employer]), using instead her own TF blend, which typically includes celery, pears, rutabaga, rice, lentils, and water of which she gets at most 167m /day.  She consumes PO a limited range of foods such as dried beans made from scratch, homemade cassava root chips, brussels sprouts, rice crisps, rice pasta, and steamed cabbage.    Assessment: Ms. GLuerawas overwhelmed with questions during today's MNT appt.  Half-way through the appt, she received a call from the medical office of today's 3:30 appt.  She did not answer the call, but was clearly distracted and agitated by all that was going on (7-YO son also home today with runny nose and cough; has not yet been tested for COVID), so we agreed to end appt early, and R/S.  She said she is not using her feeding tube b/c it is missing a piece (at the end of the J-tube), so is leaking.  Is reluctant to get new tube inserted at the hospital b/c of bad experiences there  previously.  Does not feel she has been listened to.  She feels she has only enough energy to either do normal ADLs OR to digest food, but runs out of energy if she has to do both.  With respect to dietary goals established on 06/25/20, Jyasia has been variably successful:   Small, frequent meals ~every 4 hrs:  Aims for consistent schedule planned around her frequent medications, but she was unsure how many times/day she is eating.     Plan (& prep, as possible)  tomorrow's breakfast: Has been able to do some food prep, but was non-specific with respect to quantifying this.  Eat as early in the day as possible: Could not say the earliest she has managed to eat on any given day, although she usually targets 10 AM.    Eat beyond discomfort (as appropriate): ???  Weight (self-report): Reported stable weight (106 lb; ht 64" on 06/23/20), but did not indicate date of last wt check.   Usual eating pattern: Unable to assess.   Avoided foods/substances: nightshades, salicylates, citrus, dyes, artificial flavors, dairy foods, most meats (but would like to find fresh low-histamine meat, quail, chicken eggs, fish/seafood.  Only fruit she eats are peeled, cooked Bartlet pears, cooked blueberries, homemade applesauce, cooked apple.     Sleep: Estimates average of ~5 hours of sleep/night.  Takes medication every 3 hours per 24 hrs.   No food recall before abrupt end of visit today:   Intervention: Partially reviewed diet history, and was unable to provide further recommendations.  Committed to sending patient follow-up appt options.    For recommendations and goals, see Patient Instructions.    Follow-up: Telehealth visit TBD.   Lejon Afzal,JEANNIE

## 2020-08-17 ENCOUNTER — Other Ambulatory Visit: Payer: Self-pay

## 2020-08-17 ENCOUNTER — Ambulatory Visit (INDEPENDENT_AMBULATORY_CARE_PROVIDER_SITE_OTHER): Payer: 59 | Admitting: Family Medicine

## 2020-08-17 DIAGNOSIS — E43 Unspecified severe protein-calorie malnutrition: Secondary | ICD-10-CM | POA: Diagnosis not present

## 2020-08-17 NOTE — Progress Notes (Addendum)
Telehealth Encounter (TEXT link [phone] ) PCP Brandy Muse, MD  I connected with Brandy [name] (MRN [id number]) on 08/17/2020 by MyChart video-enabled, HIPAA-compliant telemedicine application, verified that I was speaking with the correct person using two identifiers, and that the patient was in a private environment conducive to confidentiality.  The patient agreed to proceed.  Video connection was lost after about 40 minutes; we reconnected by phone, which ended soon after when patient's phone battery died.    Persons participating in visit were patient and provider (registered dietitian) Brandy Center, PhD, RD, LDN, CEDRD.  Provider was located at home during this telehealth encounter; patient was at home.  Appt start time: 1315 end time: 1400 (45 minutes)  Reason for telehealth visit: Referred upon hospital discharge by RD Brandy [name] for Medical Nutrition Therapy related to food sensitivities and eating behavior concerns. Relevant history/background: Medical hx includes:  . Asthma  . Cervical spine instability  . Chronic PTSD  . Dysautonomia    . Fibromyalgia  . GERD with hiatal hernia  . Hypermobile Ehlers-Danlos syndrome  . IBS (irritable bowel syndrome)  . Malabsorption  . 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 Myasthenia gravis . POTS (postural orthostatic tachycardia syndrome)  . Seasonal allergies  . 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 POTS.  For a comprehensive list of symptoms, see Dr. Gwenyth [name] encounter notes from 06/16/20.  Symptoms most relevant to nutrition concerns include nausea, diarrhea, constipation, heart burn, abdominal bloating and cramping, feeling full quickly after eating, difficulty swallowing, weight loss, and bad breath.  Due to fatigue, light-headedness, and syncope,  patient is wheelchair-bound.  Brandy [name] reports numerous food allergies/intolerances in addition to the 42 drug-related allergies documented in EMR.   Brandy [name] RD Brandy [name] during her hospitalization in November 2021.  Nutrition concerns at that time included severe malnutrition, confirmed by labs showing low K+, Ca+, BUN, total protein, alk phos, AST, hgb, hct, ferritin, RBC, vit's A and D, and Cu.  WNR were vit's B6, B12, folate, thiamin, vit C, and Zn.  Brandy [name] had in place a J-tube, but used primarily a G-tube for tube feeding while hospitalized, which she continues at home.  She rejected all tube feeding options on the hospital's formulary (including [employer]), using instead her own TF blend, which typically includes celery, pears, rutabaga, rice, lentils, and water of which she gets at most 164m /day.  She consumes PO a limited range of foods such as dried beans made from scratch, homemade cassava root chips, Brussels sprouts, rice crisps, rice pasta, and steamed cabbage.    Assessment: Brandy [name] recovering from the flu.  She saw her PCP a couple of weeks ago, and also saw a Brandy [name] 08/05/20.  The RD will speak to a GI physician, and f/u with Brandy [name]  Meanwhile, she advised continuing with current efforts.  Brandy [name] ~1/2 artichoke, after taking  Benadryl.  She said she had minor symptoms - mouth burning, nausea, throat and headache, and slight throat constriction.  She is not currently using her feeding tube.  Brandy [name] for a few hours each day starting today, which should help with feeling so overwhelmed with her ADLs, especially food preparation.    Progress on dietary goals established on 06/25/20:   Eat beyond discomfort (as appropriate): Most eating is uncomfortable most  of the time.  Has no appetite without medical marijuana, which she cannot use in Brandy [name].   Consistent eating times: Unable to be consistent day-to-day.  Plan (& prep, as possible)  tomorrow's breakfast: Has sometimes been able to do so, but is usually too fatigued.  Eat as early in the day as possible: Usually targets 10 AM, but it just depends on how she is doing each day.    Small, frequent meals ~every 4 hrs: Also no consistency.      Weight (self-report): 100 lb on 08-15-20 (self-report). (106 lb; ht 64" on 06/23/20).   Usual eating pattern: Erratic, just eating what and when she can manage.   Sleep: Estimates average of ~5 hours of sleep/night.  Takes medication every 3 hours per 24 hrs.  Occasionally "crashes," and doesn't wake for med's, for which she pays a price the next day, feeling and functioning even worse.   24-hr recall; phone cut off abruptly when phone battery died.    Barrier to progress: Brandy [name] said she cannot eat adequate kcal from foods she tolerates b/c of needing to avoid both high-histamine foods and salicylate sources.    Intervention: Reviewed diet history, and attempted to provide suggestions for ways to increase kcal intake.    For recommendations and goals, see Patient Instructions.    Follow-up: Telehealth visit in 2 weeks.   Brandy [name],Brandy [name]

## 2020-08-17 NOTE — Patient Instructions (Addendum)
Schedule your appt with RD Dorien Chihuahua.  Provide her with the release-of-info form you have signed, allowing me to talk with her.    Some foods to try [in small amounts!] once you get approval from your [facility] RD: - Ingredients to add calories to beverages or foods:    - Ground oats    - Casava flour     - Sunflower oil   - Ghee - Additional foods to consider:    - Sweet potato (not sure if your one experience with it is indicative)   - Winter squash (acorn, butternut)  Make a list of foods not currently eaten, but which may add some meaningful calories to your diet.  Start with those you you'd like to try.  We can look up the histamine and salicylate content of those foods once you are ready to try some of these new foods.   - Freeze foods in small quantities (for quicker freezing) to help minimize histamines.    Goals remain the same:  - Plan tomorrow's first foods of the day, and do whatever prep you can do the night before.  - Start eating as early in the day as possible. You may have best success with a cup of hot broth or tea (if you can find one that is well tolerated) first thing in the morning to help get your GI system functioning.  - Eat beyond discomfort as appropriate. Always ask yourself, "Can I take one more bite?" Aim for getting some foods by mouth every day, using tube feeding as needed to add to your intake (once it is replaced).  - Be as consistent as possible with respect to eating times each day. At scheduled eating times, even when you don't feel like eating, can you take one bite? - Consume small, frequent meals throughout the day, being as upright as possible after eating. Aim for eating something no more than 4 hours apart, even if it is a small portion, e.g., 7 AM, 10 AM, 2 PM, 5 PM, 8 PM.   Follow-up: Telehealth appt on Tuesday, 3/15 at 2:30 PM.

## 2020-08-26 ENCOUNTER — Telehealth (INDEPENDENT_AMBULATORY_CARE_PROVIDER_SITE_OTHER): Payer: 59 | Admitting: Psychiatry

## 2020-08-26 ENCOUNTER — Other Ambulatory Visit: Payer: Self-pay

## 2020-08-26 ENCOUNTER — Encounter (HOSPITAL_COMMUNITY): Payer: Self-pay | Admitting: Psychiatry

## 2020-08-26 DIAGNOSIS — F411 Generalized anxiety disorder: Secondary | ICD-10-CM | POA: Diagnosis not present

## 2020-08-26 DIAGNOSIS — F4310 Post-traumatic stress disorder, unspecified: Secondary | ICD-10-CM | POA: Diagnosis not present

## 2020-08-26 MED ORDER — LORAZEPAM 0.5 MG PO TABS: 0.5000 mg | ORAL_TABLET | Freq: Two times a day (BID) | ORAL | 1 refills | Status: DC | PRN
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
## 2020-08-27 ENCOUNTER — Ambulatory Visit (INDEPENDENT_AMBULATORY_CARE_PROVIDER_SITE_OTHER): Payer: 59 | Admitting: Licensed Clinical Social Worker

## 2020-08-27 ENCOUNTER — Other Ambulatory Visit: Payer: Self-pay

## 2020-08-27 ENCOUNTER — Encounter: Payer: Self-pay | Admitting: Licensed Clinical Social Worker

## 2020-08-27 DIAGNOSIS — F4310 Post-traumatic stress disorder, unspecified: Secondary | ICD-10-CM | POA: Diagnosis not present

## 2020-08-27 DIAGNOSIS — F411 Generalized anxiety disorder: Secondary | ICD-10-CM
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
## 2020-09-01 ENCOUNTER — Other Ambulatory Visit: Payer: Self-pay

## 2020-09-01 ENCOUNTER — Ambulatory Visit (INDEPENDENT_AMBULATORY_CARE_PROVIDER_SITE_OTHER): Payer: 59 | Admitting: Family Medicine

## 2020-09-01 DIAGNOSIS — E43 Unspecified severe protein-calorie malnutrition: Secondary | ICD-10-CM | POA: Diagnosis not present

## 2020-09-01 NOTE — Patient Instructions (Addendum)
Some foods to try [in small amounts!] once you get approval from your [facility] RD: - Ingredients to add calories to beverages or foods:    - Ground oats    - Casava flour     - Sunflower oil   - Ghee - Additional foods to consider:    - Sweet potato (not sure if your one experience with it is indicative)   - Winter squash (acorn, butternut)  I will see what I can find regarding histamine and salicylate content of Jerusalem artichokes, amaranth, tigernut flour, and chayote, and will let you know.    Recommendations remain the same: - Start IV fluids as early in the AM as possible.   - Continue to freeze foods in small quantities (for quicker freezing) to help minimize histamines.   - Start eating as early in the day as possible (before 10 AM, if you can).  Try a cup of hot broth or tea (if you find one that is well tolerated) first thing in the morning.  -Eat beyond discomfort as appropriate. Always ask yourself, "Can I take one more bite?"   - Eat as much food by mouth as possible.  Use tube feedingonly as a last resort.  - Be as consistent as possible with respect to eating times each day. At scheduled eating times, even when you don't feel like eating, can you take one bite? - Consume small, frequent meals throughout the day, being as upright as possible after eating. Aim for eating at least every 4 hours, even if it is a small portion, e.g., 9 AM, 11 AM, 3 PM, 6 PM, 9 PM.   Follow-up: Telehealth appt Monday, May 23 at 11 AM.

## 2020-09-01 NOTE — Progress Notes (Signed)
Telehealth Encounter (TEXT link [phone] ) PCP Brandy Muse, MD  I connected with Brandy [name] (MRN [id number]) on 09/01/2020 by MyChart video-enabled, HIPAA-compliant telemedicine application, verified that I was speaking with the correct person using two identifiers, and that the patient was in a private environment conducive to confidentiality.  The patient agreed to proceed.  Video connection was lost after about 40 minutes; we reconnected by phone, which ended soon after when patient's phone battery died.    Persons participating in visit were patient and provider (registered dietitian) Brandy Center, PhD, RD, LDN, CEDRD.  Provider was located at home during this telehealth encounter; patient was at home.  Appt start time: 1330 end time: 1430 (1 hour)  Reason for telehealth visit: Referred upon hospital discharge by RD Brandy [name] for Medical Nutrition Therapy related to food sensitivities and eating behavior concerns. Relevant history/background: Medical hx includes:  . Asthma  . Cervical spine instability  . Chronic PTSD  . Dysautonomia    . Fibromyalgia  . GERD with hiatal hernia  . Hypermobile Ehlers-Danlos syndrome  . IBS (irritable bowel syndrome)  . Malabsorption  . 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 Myasthenia gravis . POTS (postural orthostatic tachycardia syndrome)  . Seasonal allergies  . 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 POTS.  For a comprehensive list of symptoms, see Dr. Gwenyth [name] encounter notes from 06/16/20.  Symptoms most relevant to nutrition concerns include nausea, diarrhea, constipation, heart burn, abdominal bloating and cramping, feeling full quickly after eating, difficulty swallowing, weight loss, and bad breath.  Due to fatigue, light-headedness, and syncope,  patient is wheelchair-bound.  Ms. Brandy [name] reports numerous food allergies/intolerances in addition to the 42 drug-related allergies documented in EMR.   Ms. Brandy [name] RD Brandy [name] during her hospitalization in November 2021.  Nutrition concerns at that time included severe malnutrition, confirmed by labs showing low K+, Ca+, BUN, total protein, alk phos, AST, hgb, hct, ferritin, RBC, vit's A and D, and Cu.  WNR were vit's B6, B12, folate, thiamin, vit C, and Zn.  Ms. Brandy [name] had in place a J-tube, but used primarily a G-tube for tube feeding while hospitalized, which she continues at home.  She rejected all tube feeding options on the hospital's formulary (including [employer] [place]), using instead her own TF blend, which typically includes celery, pears, rutabaga, rice, lentils, and water of which she gets at most 153m /day.  She consumes PO a limited range of foods such as dried beans made from scratch, homemade cassava root chips, Brussels sprouts, rice crisps, rice pasta, and steamed cabbage.    Assessment: SAmberlehas been able to eat a bit better, but still has a limited variety of foods.  She has had a a home health aide for 2 hours/day for a couple of weeks now, helping her with ADLs, including food preparation.  The only "new" food she has tried is  Brussels sprouts from [employer] which are different (bigger) than [employer] ones.  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  Foods she'd like to try adding to her diet include fish (or meat) that is flash-frozen upon catch (to help limit histamines), chayote, tigernut flour, amaranth, and jerusalem artichokes.  She has continued to experience diarrhea for the past 4 days.  Feels better if she can start IV fluids soon after getting up.   [name] has a f/u appt with Brandy [name] on March 21.  Brandy [name] signed an ROI so I can speak w/ Brandy [name].  She called on 08/19/20, I  returned her call and left a message, but have not heard back.)    Progress on dietary goals established on 06/25/20:   Eat beyond discomfort (as appropriate): Most eating is uncomfortable most of the time.  Has no appetite without medical marijuana, which she cannot use in .   Consistent eating times: Consistency has improved a bit; eating bkfast ~10 AM.  Plan (& prep, as possible) tomorrow's breakfast: Breakfast is same most days, and beans, rice, waffles, and veg's are prepped in freezer in advance.   Eat as early in the day as possible: Usually targets 10 AM.    Small, frequent meals ~every 4 hrs: Hasn't yet achieved consistency.  Every day is different in how she responds to food and what her energy level is.   Weight (self-report): Had achieved 105, but back to 100 lb on 08-31-20 (ht 64"; self-reported wt of 100 lb on 08-15-20; measured wt of 108 lb on 07/09/20).   Usual eating pattern: Erratic, just eating what and when she can manage.   Sleep: Estimates average of ~5 hours of sleep/night.  Takes medication every 3 hours per 24 hrs.  Occasionally "crashes," and doesn't wake for med's, for which she pays a price the next day, feeling and functioning even worse.   No food recall today; patient could not recall.   General routine: 6 AM: Awake; takes med's with water __ AM: starts IV fluids; get's her son ready for school __ AM: +/- nap or phone calls 9 AM: Takes med's 10 AM: Some combination of waffle or rice & beans or cassava tortilla or couple of rice crackers for breakfast __ AM: Phone calls, prep for caregiver arrival at ~11 AM; aide does housework and helps with food prep for freezer 2 PM: Eats 3 PM: Takes med's  6 PM: Takes med's  4 PM: Sometimes eats small amt   7 or 8 PM: Eats at 7 or 8 PM 11 PM: Sometimes eats     Barrier to progress: [place] said she cannot eat adequate kcal from foods she tolerates b/c of needing to avoid both high-histamine foods and salicylate sources.     Intervention: Reviewed diet history, and attempted to provide suggestions for ways to increase kcal intake.    For recommendations and goals, see Patient Instructions.    Follow-up: Telehealth visit in 10 weeks (provider out of the office).   SYKES,JEANNIE

## 2020-09-03 ENCOUNTER — Ambulatory Visit (INDEPENDENT_AMBULATORY_CARE_PROVIDER_SITE_OTHER): Payer: 59 | Admitting: Licensed Clinical Social Worker

## 2020-09-03 ENCOUNTER — Other Ambulatory Visit: Payer: Self-pay

## 2020-09-03 ENCOUNTER — Encounter: Payer: Self-pay | Admitting: Licensed Clinical Social Worker

## 2020-09-03 DIAGNOSIS — F411 Generalized anxiety disorder: Secondary | ICD-10-CM | POA: Diagnosis not present

## 2020-09-03 DIAGNOSIS — F4310 Post-traumatic stress disorder, unspecified: Secondary | ICD-10-CM
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
## 2020-09-10 ENCOUNTER — Encounter: Payer: Self-pay | Admitting: Licensed Clinical Social Worker

## 2020-09-10 ENCOUNTER — Other Ambulatory Visit: Payer: Self-pay

## 2020-09-10 ENCOUNTER — Ambulatory Visit (INDEPENDENT_AMBULATORY_CARE_PROVIDER_SITE_OTHER): Payer: 59 | Admitting: Licensed Clinical Social Worker

## 2020-09-10 DIAGNOSIS — F4310 Post-traumatic stress disorder, unspecified: Secondary | ICD-10-CM | POA: Diagnosis not present

## 2020-09-10 DIAGNOSIS — F411 Generalized anxiety disorder: Secondary | ICD-10-CM

## 2020-09-10 NOTE — Progress Notes (Signed)
Virtual Visit via Video Note  I connected with Brandy [name] on 09/10/20 at 10:00 AM EDT by a video enabled telemedicine application and verified that I am speaking with the correct person using two identifiers.  Participating Parties Patient Provider  Location: Patient: Home Provider: Home Office   I discussed the limitations of evaluation and management by telemedicine and the availability of in person appointments. The patient expressed understanding and agreed to proceed.  THERAPY PROGRESS NOTE  Session Time: 30 Minutes  Participation Level: Active  Behavioral Response: CasualAlertAnxious  Type of Therapy: Individual Therapy  Treatment Goals addressed:  Pt will report decrease in symptomology 50% of the time.  Pt will use 2-3 coping skills 3-5x per week.  Pt will report being less "reactive" to stressors at least 50% of the time.  Pt will improve communication with son at least 1-3x per week.  Interventions: CBT  Summary: Brandy [name] is a 30 y.o. female who presents with PTSD and anxiety sxs. Pt had difficulty staying connected to session due to phone dying and not recharging fast enough to continue session leaving only 30 minutes out of the full hour to communicate with therapist. Pt provided the following information to complete assessment and treatment plan goals:  CCA Employment/Education Employment/Work Situation: Employment / Work Situation Employment situation: On disability How long has patient been on disability: Applied for disability in 2018, I didn't get approved until this past year, but unable to work job since 2016. 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 Has  patient ever been in the [employer]?: No  Education: Education Is Patient Currently Attending School?: No Did Teacher, adult education From [employer]?: Yes Did You Attend College?: Yes What Type of College Degree Do you Have?: Pt reported "I went to school at a university as a Physics major. 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 Pt reported in community college she completed extra credits in psychology. Pt reported due to "life events" she did not complete all levels of higher education attempts. Did You Have An Individualized Education Program (IIEP): Yes Did You Have Any Difficulty At School?: Yes Were Any Medications Ever Prescribed For These Difficulties?: Yes   CCA Substance Use Alcohol/Drug Use: Alcohol / Drug Use Pain Medications: SEE MAR Prescriptions: SEE MAR Over the Counter: SEE MAR History of alcohol / drug use?: Yes (Pt reported she has a medical marijuana card from [facility]. "In college I struggled" w/ drinking as a way to cope  Pt reported she "got in check with drinking and hasn't been a problem since then".) Longest period of sobriety (when/how long): 2.5 Years Negative Consequences of Use:  (Pt denied) Withdrawal Symptoms:  (N/A) Substance #1 Name of Substance 1: Alcohol 1 - Age of First Use: 11-12 1 - Amount (size/oz): n/a 1 - Frequency: n/a 1 - Duration: n/a 1 - Last Use / Amount: Pt reported last drink was 2.5 years ago - I tried to have a sip and made me sick (mixed drink/hard liquor). 1 - Method of Aquiring: Legal purchase 1- Route of Use: Oral  Substance use Disorder (SUD) Substance Use Disorder (SUD)  Checklist Symptoms of Substance Use:  (n/a)  Patient Centered Plan: Patient is on the following Treatment Plan(s):  Post Traumatic Stress Disorder  Pt reported she would like to work on "improving communication with my son" and provided examples of problems around this.  Pt reported she would also like to work on being "less reactive" when interacting with others and environmental stressors. Pt reported she believes she also has ADHD or possible Autism diagnoses, however no official diagnoses around this. Pt reported difficulty focusing and staying on topic and identified some sensory-processing issues. Pt reported through her continued treatment at [facility] for severe medical issues, she hopes to obtain further testing for these other diagnoses in order to address in therapy.  Suicidal/Homicidal: No  Therapist Response: Therapist met with patient for follow up. Therapist and patient discussed and reviewed education/work history, hx of substance use, and completed treatment plan goals. Therapist informed patient regarding initial phase of terminating services with this therapist due to leaving the practice in the next 5 weeks.  Plan: Return again in 1 week.  Diagnosis: Axis I: Generalized Anxiety Disorder and Post Traumatic Stress Disorder    Axis II: N/A  Josephine Igo, LCSW, LCAS 09/10/2020

## 2020-09-16 ENCOUNTER — Ambulatory Visit: Payer: 59 | Admitting: Licensed Clinical Social Worker

## 2020-09-23 ENCOUNTER — Ambulatory Visit (INDEPENDENT_AMBULATORY_CARE_PROVIDER_SITE_OTHER): Payer: 59 | Admitting: Licensed Clinical Social Worker

## 2020-09-23 ENCOUNTER — Encounter: Payer: Self-pay | Admitting: Licensed Clinical Social Worker

## 2020-09-23 ENCOUNTER — Other Ambulatory Visit: Payer: Self-pay

## 2020-09-23 DIAGNOSIS — F411 Generalized anxiety disorder: Secondary | ICD-10-CM

## 2020-09-23 DIAGNOSIS — F4310 Post-traumatic stress disorder, unspecified: Secondary | ICD-10-CM | POA: Diagnosis not present

## 2020-09-23 NOTE — Progress Notes (Signed)
Virtual Visit via Video Note  I connected with Niyana Chesbro on 09/23/20 at 11:00 AM EDT by a video enabled telemedicine application and verified that I am speaking with the correct person using two identifiers.  Participating Parties Patient Provider  Location: Patient: Home Provider: Home Office   I discussed the limitations of evaluation and management by telemedicine and the availability of in person appointments. The patient expressed understanding and agreed to proceed.  THERAPY PROGRESS NOTE  Session Time: 68 Minutes  Participation Level: Active  Behavioral Response: CasualAlertAnxious, Depressed and Tearful  Type of Therapy: Individual Therapy  Treatment Goals addressed: Coping  Interventions: CBT  Summary: Brandy [name] is a 30 y.o. female who presents with depression and anxiety sxs. Pt reported feeling "tired", "frustrated" and "stuck". Pt explained that things have not gone the way she hoped in the last year since moving from [facility] to [facility]. Pt reported difficulty building rapport and trusting current treatment team and "having to really advocate for myself". Pt reported hx of bad experiences with healthcare providers which has  "further added to my trauma". Pt reported having regrets about relocating and worries about the future "not only my quality of life, but that of my son's". Pt reported she utilizes coping thoughts s/ "I try to focus energy on what I have control over" and knowing that "my son is doing great". Pt recognized her accomplishments despite her limitations due to significant health issues and lack of social supports. Pt explained that she initially had support from friends to care for her son and thought they would be able to provide continued support when she moved to be closer to them. Pt reported after she uprooted herself from [place], her supports were no longer able to assist and stopped communicating s/ "they told me I had violated their  boundaries" by having to care for her son longer than expected due to winding up in ICU "through no fault of my own". Pt reported she was hopeful that she could reconnect with her foster sister that lives 2 hours away, but that didn't work out either.   Suicidal/Homicidal: No  Therapist Response: Therapist met with patient for follow up. Therapist and patient processed thoughts, feelings and reactions to current stressors. Therapist validated patient feelings/concerns. Pt to follow up next week.  Plan: Return again in 1 week.  Diagnosis: Axis I: Generalized Anxiety Disorder and Post Traumatic Stress Disorder    Axis II: N/A  Josephine Igo, LCSW, LCAS 09/23/2020

## 2020-10-01 ENCOUNTER — Ambulatory Visit (INDEPENDENT_AMBULATORY_CARE_PROVIDER_SITE_OTHER): Payer: 59 | Admitting: Licensed Clinical Social Worker

## 2020-10-01 ENCOUNTER — Other Ambulatory Visit: Payer: Self-pay

## 2020-10-01 ENCOUNTER — Encounter: Payer: Self-pay | Admitting: Licensed Clinical Social Worker

## 2020-10-01 DIAGNOSIS — F411 Generalized anxiety disorder: Secondary | ICD-10-CM | POA: Diagnosis not present

## 2020-10-01 DIAGNOSIS — F4310 Post-traumatic stress disorder, unspecified: Secondary | ICD-10-CM | POA: Diagnosis not present

## 2020-10-01 NOTE — Progress Notes (Signed)
Virtual Visit via Video Note  I connected with Brandy [name] on 10/01/20 at 10:00 AM EDT by a video enabled telemedicine application and verified that I am speaking with the correct person using two identifiers.  Participating Parties Patient Provider  Location: Patient: Home Provider: Home Office   I discussed the limitations of evaluation and management by telemedicine and the availability of in person appointments. The patient expressed understanding and agreed to proceed.  THERAPY PROGRESS NOTE  Session Time: 45 Minutes  Participation Level: Active  Behavioral Response: CasualAlertAnxious and Depressed  Type of Therapy: Individual Therapy  Treatment Goals addressed: Anxiety and Coping  Interventions: CBT  Summary: Brandy [name] is a 30 y.o. female who presents with depression and anxiety sxs. Pt was tearful throughout session s/ "I am feeling so burnt out and hate change. I struggle with it. It triggers things for me. I finally get settled in and now caregiver has to take a step back to care for her ill cousin. I hate putting on the stupid neck brace, but hurts without it. Everything is on hold". Pt reported she is not able to sleep "more than 4 hours at a time" and wakes up in a sweat. Pt reported that she continues to push herself s/ "I am just trying to make my body survive. I still have hope" and recognized "its just all my needs aren't being met". Pt has been struggling to establish care and have her medical team members all on the same page. Pt reported it is difficult not to get emotional when talking with her providers while still trying to build rapport. Pt does not want to end up back in the hospital due to her significant health issues and continues to be monitored and have visits with her care team. Pt reported being able to have her voice heard is important and plans on communicating this to her doctor at next visit. Pt to follow up for last session with this  therapist in one week.  Suicidal/Homicidal: No  Therapist Response: Therapist met with patient for follow up. Therapist and patient processed thoughts, feelings and reactions to current stressors. Therapist validated patient feelings/concerns and provided feedback. Pt was receptive.  Plan: Return again in 1 week.  Diagnosis: Axis I: Generalized Anxiety Disorder and Post Traumatic Stress Disorder    Axis II: N/A  Lauren P O'Reilly, LCSW, LCAS 10/01/2020  

## 2020-10-08 ENCOUNTER — Encounter: Payer: Self-pay | Admitting: Licensed Clinical Social Worker

## 2020-10-08 ENCOUNTER — Other Ambulatory Visit: Payer: Self-pay

## 2020-10-08 ENCOUNTER — Ambulatory Visit (INDEPENDENT_AMBULATORY_CARE_PROVIDER_SITE_OTHER): Payer: 59 | Admitting: Licensed Clinical Social Worker

## 2020-10-08 DIAGNOSIS — F411 Generalized anxiety disorder: Secondary | ICD-10-CM | POA: Diagnosis not present

## 2020-10-08 DIAGNOSIS — F4310 Post-traumatic stress disorder, unspecified: Secondary | ICD-10-CM

## 2020-10-08 NOTE — Progress Notes (Signed)
Virtual Visit via Video Note  I connected with Kelsi Marie Fauth on 10/08/20 at 11:00 AM EDT by a video enabled telemedicine application and verified that I am speaking with the correct person using two identifiers.  Participating Parties Patient Provider  Location: Patient: Home Provider: Home Office   I discussed the limitations of evaluation and management by telemedicine and the availability of in person appointments. The patient expressed understanding and agreed to proceed.  THERAPY PROGRESS NOTE  Session Time: 5 Minutes  Participation Level: Active  Behavioral Response: CasualLethargicAnxious  Type of Therapy: Individual Therapy  Treatment Goals addressed: Coping  Interventions: Supportive  Summary: Brandy [name] is a 30 y.o. female who presents with depression and anxiety sxs. Pt apologize for responding late to session and reported that she forgot about appointment. Pt reported that her son is out of school for spring break and caregiver will be showing up any minute so she would like to just do a quick check-in due to lack of privacy. Pt reported feeling "burnout and not able to keep up with emails and phone calls. If I push myself any further, I will lose my voice.   Suicidal/Homicidal: No  Therapist Response: Therapist met briefly with patient for check-in. Therapist and patient discussed options for continued care as this therapist is leaving the practice on 10/09/20.  Plan: Return again as needed to resume therapy with new therapist/pursue outside therapy resources. Follow up with psychiatrist.  Diagnosis: Axis I: Generalized Anxiety Disorder and Post Traumatic Stress Disorder    Axis II: N/A  Lauren P O'Reilly, LCSW, LCAS 10/08/2020  

## 2020-10-20 ENCOUNTER — Encounter (HOSPITAL_COMMUNITY): Payer: Self-pay | Admitting: Psychiatry

## 2020-10-20 ENCOUNTER — Other Ambulatory Visit: Payer: Self-pay

## 2020-10-20 ENCOUNTER — Telehealth (INDEPENDENT_AMBULATORY_CARE_PROVIDER_SITE_OTHER): Payer: 59 | Admitting: Psychiatry

## 2020-10-20 DIAGNOSIS — F411 Generalized anxiety disorder: Secondary | ICD-10-CM

## 2020-10-20 DIAGNOSIS — F4310 Post-traumatic stress disorder, unspecified: Secondary | ICD-10-CM | POA: Diagnosis not present

## 2020-10-20 MED ORDER — LORAZEPAM 0.5 MG PO TABS
0.5000 mg | ORAL_TABLET | Freq: Two times a day (BID) | ORAL | 0 refills | Status: AC | PRN
Start: 2020-10-20 — End: ?

## 2020-10-20 NOTE — Progress Notes (Signed)
BH OP Progress Note   Virtual Visit via Telephone Note  I connected with Brandy [name] on 10/20/20 at 10:30 AM EDT by telephone and verified that I am speaking with the correct person using two identifiers.  Location: Patient: home Provider: Clinic   I discussed the limitations, risks, security and privacy concerns of performing an evaluation and management service by telephone and the availability of in person appointments. I also discussed with the patient that there may be a patient responsible charge related to this service. The patient expressed understanding and agreed to proceed.   I provided 14 minutes of non-face-to-face time during this encounter.  The appt was cut short as pt kept repeating that she could not hear the Clinical research associate.      Patient Identification: Brandy [name] MRN:  [id number] Date of Evaluation:  10/20/2020    Chief Complaint:   " I have been sick. "  Visit Diagnosis:    ICD-10-CM   1. PTSD (post-traumatic stress disorder)  F43.10   2. 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 Patient is allergic to 49 different medications and other things.  Patient was contacted via phone for today's follow-up.  Patient reported that she had forgotten about today's appointment with writer.  Patient stated that she has been very sick.  She stated that her ears have been clogged up and she can barely hear the writer during the telephone conversation. Writer asked her how she was doing and she stated that she is not doing well because of her health  conditions. Writer asked about refills she stated that she needs refills for Ativan.  Writer noted that patient is scheduled to see a psychiatrist with [facility] psychiatry on May 24 as per EMR.  Writer asked her regarding that appointment and she kept stating that she can hear the Clinical research associate.  Writer will send a 30-day prescription for Ativan to her pharmacy to get her through her upcoming visit with [facility] psychiatry.  Past Psychiatric History: Depression, anxiety, PTSD  Previous Psychotropic Medications: Yes   Substance Abuse History in the last 12 months:  No.  Consequences of Substance Abuse: NA  Past Medical History:  Past Medical History:  Diagnosis Date  . Myasthenia gravis [facility])     Past Surgical History:  Procedure Laterality Date  . IR GJ TUBE CHANGE  07/11/2020  . IR REMOVAL TUN CV CATH W/O [facility]  06/03/2020    Family Psychiatric History: denied  Family History:  Family History  Adopted: Yes    Social History:   Social History   Socioeconomic History  . Marital status: Single    Spouse name: Not on file  . Number of children: Not on file  . Years of education: Not on file  . Highest education level: Not on file  Occupational History  . Not on file  Tobacco Use  . Smoking status: Former Games developer  . Smokeless tobacco: Never Used  Substance and Sexual Activity  . Alcohol use: Not on file  . Drug use: Not on file  . Sexual activity: Not on file  Other Topics Concern  . Not on file  Social History Narrative  . Not on file  Social Determinants of Health   Financial Resource Strain: Not on file  Food Insecurity: Not on file  Transportation Needs: Not on file  Physical Activity: Not on file  Stress: Not on file  Social Connections: Not on file    Additional Social History: Currently lives with her 4-year-old son in an apartment that she is renting.  Recently started getting home health nurse services.  Allergies:   Allergies  Allergen Reactions  . Aspirin    . Nsaids   . Quinolones   . Sulfa Antibiotics   . Telithromycin   . Fludrocortisone   . Ambien [Zolpidem]   . Amitriptyline   . Beef-Potatoes-Spinach [Compleat]   . Botulinum Toxins   . Chlorhexidine Hives  . Ciprofloxacin   . Codeine   . Contrast Media [Iodinated Diagnostic Agents]   . Creon [Pancrelipase (Lip-Prot-Amyl)]   . Cymbalta [Duloxetine Hcl]   . Dextrans   . Ergotamine   . Fluoxetine     All SSRIs All SSRIs   . Gabapentin   . Gallamine   . Hydrocodone   . Iodine   . Lactose Intolerance (Gi)   . Levofloxacin   . Lunesta [Eszopiclone]   . Macrolides And Ketolides   . Maprotiline   . Marplan [Isocarboxazid]     Any MAOI contraindicated  . Midodrine   . Morphine And Related   . Nitrous Oxide   . Oxycodone   . Pethidine [Meperidine]   . Phenergan [Promethazine]   . Phenobarbital   . Pyridium [Phenazopyridine]   . Reglan [Metoclopramide]   . Remeron [Mirtazapine]     Noradrenergic antagonist  . Salicylates   . Savella [Milnacipran]   . Scopolamine   . Sumatriptan   . Tape   . Topiramate   . Tricyclic Antidepressants   . Tums [Calcium Carbonate]     Patient reports allergic.  RN to find out adverse reaction and update allergy field.   . Vancomycin   . Verapamil     Metabolic Disorder Labs: Lab Results  Component Value Date   HGBA1C 4.7 (L) 05/16/2020   MPG 88.19 05/16/2020   No results found for: PROLACTIN No results found for: CHOL, TRIG, HDL, CHOLHDL, VLDL, LDLCALC Lab Results  Component Value Date   TSH 0.365 07/09/2020    Therapeutic Level Labs: No results found for: LITHIUM No results found for: CBMZ No results found for: VALPROATE  Current Medications: Current Outpatient Medications  Medication Sig Dispense Refill  . albuterol (VENTOLIN HFA) 108 (90 Base) MCG/ACT inhaler Inhale into the lungs every 6 (six) hours as needed for wheezing or shortness of breath.    . baclofen (LIORESAL) 10 MG tablet Take 1 tablet (10 mg total) by  mouth See admin instructions. Take one 10 mg tablet four times a day at 6AM, 12PM, 6PM, and 12AM 90 each 0  . CORLANOR 5 MG TABS tablet Take 1 tablet (5 mg total) by mouth 2 (two) times daily. 6am,6pm 60 tablet 0  . cromolyn (GASTROCROM) 100 MG/5ML solution Take 100 mg by mouth See admin instructions. 2 ampule in water taken 4 times a day before meals/bed and PRN    . cromolyn (INTAL) 20 MG/2ML nebulizer solution Take 20 mg by nebulization See admin instructions. 1 vial taken 3 times a day and PRN    . diphenhydrAMINE-zinc acetate (BENADRYL) cream Apply 1-2 application topically daily as needed (Rash from Mast Cell and Allergies).    . EPINEPHrine 0.3 mg/0.3 mL IJ SOAJ injection Inject 0.3  mg into the muscle as needed for anaphylaxis.    . famotidine (PEPCID) 40 MG tablet Take 40 mg by mouth See admin instructions. Take one 40 mg tablet twice a day at 6AM and 6PM.    . Fluticasone-Salmeterol (ADVAIR) 100-50 MCG/DOSE AEPB Inhale 1 puff into the lungs in the morning and at bedtime. 6am and 6pm    . ipratropium-albuterol (DUONEB) 0.5-2.5 (3) MG/3ML SOLN Inhale 3 mLs into the lungs See admin instructions. Qid and prn    . ketotifen (ZADITOR) 0.025 % ophthalmic solution Place 1 drop into both eyes daily as needed (Mast cell, Allergies, MCS/EI).    Marland Kitchen levocetirizine (XYZAL) 5 MG tablet Take 10 mg by mouth in the morning and at bedtime. 6am,6pm    . LORazepam (ATIVAN) 0.5 MG tablet Take 1 tablet (0.5 mg total) by mouth 2 (two) times daily as needed for anxiety. 60 tablet 1  . ondansetron (ZOFRAN-ODT) 8 MG disintegrating tablet Take 8 mg by mouth every 8 (eight) hours as needed for nausea or vomiting.    . Potassium Citrate 99 MG CAPS Take 99 mg by mouth daily. 6 or 9pm    . predniSONE (DELTASONE) 5 MG tablet Take 5 tablets (25 mg total) by mouth daily with breakfast. (Patient taking differently: Take 25 mg by mouth daily with breakfast. 6am) 150 tablet 1  . pyridostigmine (MESTINON) 60 MG tablet Take 1.5  tablets (90 mg total) by mouth every 3 (three) hours. 12am,3am,6am,9am,12pm,3pm,6pm,9pm 90 tablet 0  . vitamin B-12 (CYANOCOBALAMIN) 1000 MCG tablet Take 1,000 mcg by mouth daily. 6 or 9pm    . Vitamin D, Ergocalciferol, (DRISDOL) 1.25 MG (50000 UNIT) CAPS capsule Take 50,000 Units by mouth 2 (two) times a week.    . zafirlukast (ACCOLATE) 20 MG tablet Take 1 tablet (20 mg total) by mouth 2 (two) times daily before a meal. 6am 6pm 60 tablet 0   No current facility-administered medications for this visit.      Psychiatric Specialty Exam: Review of Systems  There were no vitals taken for this visit.There is no height or weight on file to calculate BMI.  General Appearance: Unable to assess due to phone visit, sounded to be in distress due to constant coughing  Eye Contact:  Unable to assess due to phone visit  Speech:  Clear and Coherent and Slow  Volume:  Normal  Mood:  Anxious  Affect:  Congruent  Thought Process:  Goal Directed and Descriptions of Associations: Intact  Orientation:  Full (Time, Place, and Person)  Thought Content:  Logical  Suicidal Thoughts:  No  Homicidal Thoughts:  No  Memory:  Immediate;   Good Recent;   Good  Judgement:  Fair  Insight:  Fair  Psychomotor Activity:  Decreased  Concentration:  Concentration: Fair and Attention Span: Fair  Recall:  Good  Fund of Knowledge:Good  Language: Good  Akathisia:  Negative  Handed:  Right  AIMS (if indicated):  Not done due to telemed visit  Assets:  Communication Skills Desire for Improvement Financial Resources/Insurance Housing  ADL's:  Intact  Cognition: WNL  Sleep:  Fair   Screenings: GAD-7   Flowsheet Row Video Visit from 08/26/2020 in [facility] from 06/04/2020 in [facility]  Total GAD-7 Score 12 0    PHQ2-9   Flowsheet Row Video [facility]/26/2020 [facility] from 06/04/2020 in [facility]  PHQ-2 Total Score 1 0  PHQ-9 Total Score 9 --    Flowsheet Row Counselor [facility]  C-SSRS RISK CATEGORY No Risk No Risk      Assessment and Plan: Patient seems to be in mild distress due to being sick.  She complained of clogged ears and had a hard time hearing the Clinical research associatewriter.  Writer informed her that since she [facility] writer will send 1 prescription for lorazepam.  The plan was for her to taper it down however based on PDMP review it seems like she is taking it regularly.   1. PTSD (post-traumatic stress disorder) -  Patient does not want to retry Effexor but she has responded to fairly well in the past.  2. GAD (generalized anxiety disorder)  - Continue LORazepam (ATIVAN) 0.5 MG tablet; Take 1 tablet (0.5 mg total) by mouth 2 (two) times daily as needed for anxiety.  Dispense: 60 tablet; Refill: 0  1 prescription for Ativan 0.5 mg twice daily sent to her pharmacy. [facility] May [facility] as per EMR.  Zena AmosMandeep Kayra Crowell, MD 5/3/202210:22 AM

## 2020-10-26 ENCOUNTER — Encounter (HOSPITAL_COMMUNITY): Payer: Self-pay | Admitting: Student

## 2020-10-26 ENCOUNTER — Inpatient Hospital Stay (HOSPITAL_COMMUNITY)
Admission: EM | Admit: 2020-10-26 | Discharge: 2020-11-06 | DRG: 193 | Disposition: A | Discharge status: Home Health | Payer: 59 | Attending: Internal Medicine | Admitting: Internal Medicine

## 2020-10-26 DIAGNOSIS — A419 Sepsis, unspecified organism: Secondary | ICD-10-CM

## 2020-10-26 DIAGNOSIS — R509 Fever, unspecified: Secondary | ICD-10-CM | POA: Diagnosis present

## 2020-10-26 DIAGNOSIS — Z888 Allergy status to other drugs, medicaments and biological substances status: Secondary | ICD-10-CM

## 2020-10-26 DIAGNOSIS — Z91018 Allergy to other foods: Secondary | ICD-10-CM

## 2020-10-26 DIAGNOSIS — Z886 Allergy status to analgesic agent status: Secondary | ICD-10-CM

## 2020-10-26 DIAGNOSIS — R0602 Shortness of breath: Secondary | ICD-10-CM | POA: Diagnosis not present

## 2020-10-26 DIAGNOSIS — E878 Other disorders of electrolyte and fluid balance, not elsewhere classified: Secondary | ICD-10-CM | POA: Diagnosis present

## 2020-10-26 DIAGNOSIS — D638 Anemia in other chronic diseases classified elsewhere: Secondary | ICD-10-CM | POA: Diagnosis present

## 2020-10-26 DIAGNOSIS — Z881 Allergy status to other antibiotic agents status: Secondary | ICD-10-CM

## 2020-10-26 DIAGNOSIS — T85848A Pain due to other internal prosthetic devices, implants and grafts, initial encounter: Secondary | ICD-10-CM | POA: Diagnosis present

## 2020-10-26 DIAGNOSIS — R54 Age-related physical debility: Secondary | ICD-10-CM | POA: Diagnosis present

## 2020-10-26 DIAGNOSIS — N390 Urinary tract infection, site not specified: Secondary | ICD-10-CM | POA: Diagnosis present

## 2020-10-26 DIAGNOSIS — M532X2 Spinal instabilities, cervical region: Secondary | ICD-10-CM | POA: Diagnosis present

## 2020-10-26 DIAGNOSIS — D8940 Mast cell activation, unspecified: Secondary | ICD-10-CM | POA: Diagnosis present

## 2020-10-26 DIAGNOSIS — E869 Volume depletion, unspecified: Secondary | ICD-10-CM | POA: Diagnosis present

## 2020-10-26 DIAGNOSIS — J189 Pneumonia, unspecified organism: Secondary | ICD-10-CM | POA: Diagnosis not present

## 2020-10-26 DIAGNOSIS — Z885 Allergy status to narcotic agent status: Secondary | ICD-10-CM

## 2020-10-26 DIAGNOSIS — E43 Unspecified severe protein-calorie malnutrition: Secondary | ICD-10-CM

## 2020-10-26 DIAGNOSIS — F4310 Post-traumatic stress disorder, unspecified: Secondary | ICD-10-CM | POA: Diagnosis present

## 2020-10-26 DIAGNOSIS — R6510 Systemic inflammatory response syndrome (SIRS) of non-infectious origin without acute organ dysfunction: Secondary | ICD-10-CM | POA: Diagnosis present

## 2020-10-26 DIAGNOSIS — Z681 Body mass index (BMI) 19 or less, adult: Secondary | ICD-10-CM

## 2020-10-26 DIAGNOSIS — Z79899 Other long term (current) drug therapy: Secondary | ICD-10-CM

## 2020-10-26 DIAGNOSIS — Q796 Ehlers-Danlos syndrome, unspecified: Secondary | ICD-10-CM

## 2020-10-26 DIAGNOSIS — Z20822 Contact with and (suspected) exposure to covid-19: Secondary | ICD-10-CM | POA: Diagnosis present

## 2020-10-26 DIAGNOSIS — Z7951 Long term (current) use of inhaled steroids: Secondary | ICD-10-CM

## 2020-10-26 DIAGNOSIS — R0902 Hypoxemia: Secondary | ICD-10-CM | POA: Diagnosis present

## 2020-10-26 DIAGNOSIS — E876 Hypokalemia: Secondary | ICD-10-CM | POA: Diagnosis present

## 2020-10-26 DIAGNOSIS — G901 Familial dysautonomia [Riley-Day]: Secondary | ICD-10-CM

## 2020-10-26 DIAGNOSIS — Y833 Surgical operation with formation of external stoma as the cause of abnormal reaction of the patient, or of later complication, without mention of misadventure at the time of the procedure: Secondary | ICD-10-CM | POA: Diagnosis not present

## 2020-10-26 DIAGNOSIS — F411 Generalized anxiety disorder: Secondary | ICD-10-CM | POA: Diagnosis present

## 2020-10-26 DIAGNOSIS — Z91041 Radiographic dye allergy status: Secondary | ICD-10-CM

## 2020-10-26 DIAGNOSIS — Z882 Allergy status to sulfonamides status: Secondary | ICD-10-CM

## 2020-10-26 DIAGNOSIS — R64 Cachexia: Secondary | ICD-10-CM | POA: Diagnosis present

## 2020-10-26 DIAGNOSIS — G90A Postural orthostatic tachycardia syndrome (POTS): Secondary | ICD-10-CM | POA: Diagnosis present

## 2020-10-26 DIAGNOSIS — L03311 Cellulitis of abdominal wall: Secondary | ICD-10-CM | POA: Diagnosis present

## 2020-10-26 DIAGNOSIS — L8990 Pressure ulcer of unspecified site, unspecified stage: Secondary | ICD-10-CM | POA: Insufficient documentation

## 2020-10-26 DIAGNOSIS — Z7952 Long term (current) use of systemic steroids: Secondary | ICD-10-CM

## 2020-10-26 DIAGNOSIS — Z993 Dependence on wheelchair: Secondary | ICD-10-CM

## 2020-10-26 DIAGNOSIS — I498 Other specified cardiac arrhythmias: Secondary | ICD-10-CM | POA: Diagnosis present

## 2020-10-26 DIAGNOSIS — Z9189 Other specified personal risk factors, not elsewhere classified: Secondary | ICD-10-CM

## 2020-10-26 DIAGNOSIS — G7000 Myasthenia gravis without (acute) exacerbation: Secondary | ICD-10-CM | POA: Diagnosis present

## 2020-10-26 DIAGNOSIS — B965 Pseudomonas (aeruginosa) (mallei) (pseudomallei) as the cause of diseases classified elsewhere: Secondary | ICD-10-CM | POA: Diagnosis present

## 2020-10-26 DIAGNOSIS — E871 Hypo-osmolality and hyponatremia: Secondary | ICD-10-CM | POA: Diagnosis present

## 2020-10-26 DIAGNOSIS — Z87891 Personal history of nicotine dependence: Secondary | ICD-10-CM

## 2020-10-26 MED ORDER — SODIUM CHLORIDE 0.9 % IV BOLUS (SEPSIS)
1000.0000 mL | Freq: Once | INTRAVENOUS | Status: AC
  Administered 2020-10-27: 1000 mL via INTRAVENOUS

## 2020-10-26 MED ORDER — AEROCHAMBER PLUS FLO-VU LARGE MISC
Status: AC
  Filled 2020-10-26: qty 1

## 2020-10-26 MED ORDER — CEFTRIAXONE SODIUM 2 G IJ SOLR
2.0000 g | INTRAMUSCULAR | Status: DC
  Administered 2020-10-27 (×2): 2 g via INTRAVENOUS
  Filled 2020-10-26 (×2): qty 20

## 2020-10-26 MED ORDER — SODIUM CHLORIDE 0.9 % IV BOLUS (SEPSIS)
500.0000 mL | Freq: Once | INTRAVENOUS | Status: AC
Start: 2020-10-26 — End: 2020-10-27
  Administered 2020-10-27: 500 mL via INTRAVENOUS

## 2020-10-26 MED ORDER — ACETAMINOPHEN 500 MG PO TABS
1000.0000 mg | ORAL_TABLET | Freq: Once | ORAL | Status: DC
  Filled 2020-10-26: qty 2

## 2020-10-26 MED ORDER — ALBUTEROL SULFATE HFA 108 (90 BASE) MCG/ACT IN AERS
2.0000 | INHALATION_SPRAY | Freq: Once | RESPIRATORY_TRACT | Status: AC
  Administered 2020-10-27: 2 via RESPIRATORY_TRACT
  Filled 2020-10-26: qty 6.7

## 2020-10-26 MED ORDER — PYRIDOSTIGMINE BROMIDE 60 MG PO TABS
90.0000 mg | ORAL_TABLET | Freq: Once | ORAL | Status: AC
  Administered 2020-10-27: 90 mg via ORAL
  Filled 2020-10-26: qty 1.5

## 2020-10-26 NOTE — ED Notes (Signed)
Pt refused to have electrodes put on for an EKG. PA Lelon Mast was notified.

## 2020-10-26 NOTE — ED Provider Notes (Signed)
30 year old female, multiple medical problems including Ehlers-Danlos syndrome, myasthenia gravis, she has cervical instability and wears an Biochemist, clinical, she has dysautonomia, she also has anxiety disorder.  She presents to the hospital with a complaint of fever and shortness of breath.  This is gradually worsening, became severe, she called for EMS transport.  They found the patient on her home oxygen wearing a scuba mask, she has decreased lung sounds at the left base, she has tachypnea and a slight increased work of breathing, she has no edema, she is not tachycardic, she is hot to the touch and has a temperature of 101.2.  Her mental status seems to be alert but she is slightly slow to respond.  Sepsis protocol used, cultures, chest x-ray, antibiotics, anticipate admission given the patient's multiple underlying comorbidities.  The patient is presumed septic.  I agree with critical care documentation as documented by physician assistant   Medical screening examination/treatment/procedure(s) were conducted as a shared visit with non-physician practitioner(s) and myself.  I personally evaluated the patient during the encounter.  Clinical Impression:   Final diagnoses:  Community acquired pneumonia, unspecified laterality  Sepsis, due to unspecified organism, unspecified whether acute organ dysfunction present [facility])         Eber Hong, MD 10/27/20 1544

## 2020-10-26 NOTE — Progress Notes (Signed)
Elink following for Sepsis Protocol 

## 2020-10-26 NOTE — ED Provider Notes (Signed)
[facility] EMERGENCY DEPARTMENT Provider Note   CSN: [id number] Arrival date & time: 10/26/20  2213     History Chief Complaint  Patient presents with  . Shortness of Breath    Brandy [name] is a 30 y.o. 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 Patient reports she has generally not been feeling well over the past few days, reports subjective fever, chills, fatigue, generalized achiness, congestion, cough productive of phlegm sputum (no hemoptysis), mild abdominal discomfort, vomiting, & diarrhea. She has been keeping some things down between episodes of emesis. No alleviating/aggravating factors to her sxs. She denies chest pain, hemoptysis, hematemesis, melena, hematochezia, dysuria, or leg pain/swelling.   Patient wears 2L O2, utilizes an electric wheel chair, wears an aspen collar, and has a port through which she receives IVFs at baseline.   HPI     Past Medical History:  Diagnosis Date  . Myasthenia gravis [facility])     Patient Active Problem List   Diagnosis Date Noted  . Feeding tube dysfunction 07/10/2020  . 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 Ehlers-Danlos syndrome 06/28/2020  . Uses feeding tube 06/28/2020  . Restricted diet 06/28/2020  . 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 Chronic idiopathic urticaria 06/28/2020  . POTS (postural orthostatic tachycardia syndrome) 06/28/2020  . Dysautonomia (HCC) 06/28/2020  . PTSD (post-traumatic stress disorder) 06/28/2020  . GAD (generalized anxiety disorder) 06/28/2020  . History of major depression 06/28/2020  . Absolute anemia  06/28/2020  . On home oxygen therapy 06/28/2020  . Fibromyalgia 06/28/2020  . Myasthenia gravis (HCC) 05/23/2020  . Protein calorie malnutrition (HCC) 05/20/2020  . Myasthenia exacerbation (HCC) 05/16/2020    Past Surgical History:  Procedure Laterality Date  . IR GJ TUBE CHANGE  07/11/2020  . IR REMOVAL TUN CV CATH W/O FL  06/03/2020     OB History   No obstetric history on file.     Family History  Adopted: Yes    Social History   Tobacco Use  . Smoking status: Former Games developer  . Smokeless tobacco: Never Used    Home Medications Prior to Admission medications   Medication Sig Start Date End Date Taking? Authorizing Provider  albuterol (VENTOLIN HFA) 108 (90 Base) MCG/ACT inhaler Inhale into the lungs every 6 (six) hours as needed for wheezing or shortness of breath.    [provider]  baclofen (LIORESAL) 10 MG tablet Take 1 tablet (10 mg total) by mouth See admin instructions. Take one 10 mg tablet four times a day at 6AM, 12PM, 6PM, and 12AM 07/11/20   Westley Chandler, MD  CORLANOR 5 MG TABS tablet Take 1 tablet (5 mg total) by mouth 2 (two) times daily. 6am,6pm 07/11/20   Westley Chandler, MD  cromolyn (GASTROCROM) 100 MG/5ML solution Take 100 mg by mouth See admin instructions. 2 ampule in water taken 4 times a day before meals/bed and PRN    [provider]  cromolyn (INTAL) 20 MG/2ML nebulizer solution Take 20 mg by nebulization See admin instructions. 1 vial taken 3 times a day and PRN    [provider]  diphenhydrAMINE-zinc acetate (BENADRYL) cream Apply 1-2 application topically daily as needed (  Rash from Mast Cell and Allergies).    [provider]  EPINEPHrine 0.3 mg/0.3 mL IJ SOAJ injection Inject 0.3 mg into the muscle as needed for anaphylaxis.    [provider]  famotidine (PEPCID) 40 MG tablet Take 40 mg by mouth See admin instructions. Take one 40 mg tablet twice a day at 6AM and 6PM.    [provider]   Fluticasone-Salmeterol (ADVAIR) 100-50 MCG/DOSE AEPB Inhale 1 puff into the lungs in the morning and at bedtime. 6am and 6pm 06/01/20   [provider]  ipratropium-albuterol (DUONEB) 0.5-2.5 (3) MG/3ML SOLN Inhale 3 mLs into the lungs See admin instructions. Qid and prn    [provider]  ketotifen (ZADITOR) 0.025 % ophthalmic solution Place 1 drop into both eyes daily as needed (Mast cell, Allergies, MCS/EI).    [provider]  levocetirizine (XYZAL) 5 MG tablet Take 10 mg by mouth in the morning and at bedtime. 6am,6pm 06/01/20   [provider]  LORazepam (ATIVAN) 0.5 MG tablet Take 1 tablet (0.5 mg total) by mouth 2 (two) times daily as needed for anxiety. 10/20/20   Zena Amos, MD  ondansetron (ZOFRAN-ODT) 8 MG disintegrating tablet Take 8 mg by mouth every 8 (eight) hours as needed for nausea or vomiting.    [provider]  Potassium Citrate 99 MG CAPS Take 99 mg by mouth daily. 6 or 9pm    [provider]  predniSONE (DELTASONE) 5 MG tablet Take 5 tablets (25 mg total) by mouth daily with breakfast. Patient taking differently: Take 25 mg by mouth daily with breakfast. 6am 05/22/20   Lewie Chamber, MD  pyridostigmine (MESTINON) 60 MG tablet Take 1.5 tablets (90 mg total) by mouth every 3 (three) hours. 12am,3am,6am,9am,12pm,3pm,6pm,9pm 07/11/20   Westley Chandler, MD  vitamin B-12 (CYANOCOBALAMIN) 1000 MCG tablet Take 1,000 mcg by mouth daily. 6 or 9pm    [provider]  Vitamin D, Ergocalciferol, (DRISDOL) 1.25 MG (50000 UNIT) CAPS capsule Take 50,000 Units by mouth 2 (two) times a week.    [provider]  zafirlukast (ACCOLATE) 20 MG tablet Take 1 tablet (20 mg total) by mouth 2 (two) times daily before a meal. 6am 6pm 07/11/20   Westley Chandler, MD    Allergies    Aspirin, Nsaids, Quinolones, Sulfa antibiotics, Telithromycin, Fludrocortisone, Ambien [zolpidem], Amitriptyline, Beef-potatoes-spinach [compleat],  Botulinum toxins, Chlorhexidine, Ciprofloxacin, Codeine, Contrast media [iodinated diagnostic agents], Creon [pancrelipase (lip-prot-amyl)], Cymbalta [duloxetine hcl], Dextrans, Ergotamine, Fluoxetine, Gabapentin, Gallamine, Hydrocodone, Iodine, Lactose intolerance (gi), Levofloxacin, Lunesta [eszopiclone], Macrolides and ketolides, Maprotiline, Marplan [isocarboxazid], Midodrine, Morphine and related, Nitrous oxide, Oxycodone, Pethidine [meperidine], Phenergan [promethazine], Phenobarbital, Pyridium [phenazopyridine], Reglan [metoclopramide], Remeron [mirtazapine], Salicylates, Savella [milnacipran], Scopolamine, Sumatriptan, Tape, Topiramate, Tricyclic antidepressants, Tums [calcium carbonate], Vancomycin, and Verapamil  Review of Systems   Review of Systems  Constitutional: Positive for chills, fatigue and fever.  HENT: Positive for congestion.   Respiratory: Positive for cough and shortness of breath.   Cardiovascular: Negative for chest pain and leg swelling.  Gastrointestinal: Positive for abdominal pain, diarrhea, nausea and vomiting. Negative for blood in stool.  Genitourinary: Negative for dysuria.  All other systems reviewed and are negative.   Physical Exam Updated Vital Signs BP (!) 158/80 (BP Location: Right Arm)   Pulse 76   Temp (!) 101.2 F (38.4 C) (Rectal)   Resp (!) 22   SpO2 96%   Physical Exam Vitals and nursing note reviewed.  Constitutional:      Appearance: She  is ill-appearing. She is not toxic-appearing.  HENT:     Head: Normocephalic and atraumatic.     Comments: Patient wearing protective respirator.  Eyes:     Pupils: Pupils are equal, round, and reactive to light.  Neck:     Comments: Aspen collar in place.  Cardiovascular:     Rate and Rhythm: Normal rate and regular rhythm.  Pulmonary:     Effort: Tachypnea present.     Breath sounds: Decreased breath sounds (left lower lobe) present. No wheezing or rales.  Abdominal:     Palpations: Abdomen is  soft.     Tenderness: There is no guarding or rebound.     Comments: GJ tube in place mild erythema inferiorly, no induration/fluctuance  Musculoskeletal:     Right lower leg: No tenderness. No edema.     Left lower leg: No tenderness. No edema.  Skin:    General: Skin is warm and dry.     Comments: Hot to the touch.   Neurological:     Mental Status: She is alert.    ED Results / Procedures / Treatments   Labs (all labs ordered are listed, but only abnormal results are displayed) Labs Reviewed  COMPREHENSIVE METABOLIC PANEL - Abnormal; Notable for the following components:      Result Value   Sodium 127 (*)    Potassium 3.1 (*)    Chloride 86 (*)    BUN 22 (*)    Creatinine, Ser 1.03 (*)    Calcium 7.2 (*)    Total Protein 5.2 (*)    Albumin 1.6 (*)    AST 56 (*)    Total Bilirubin 2.1 (*)    All other components within normal limits  CBC WITH DIFFERENTIAL/PLATELET - Abnormal; Notable for the following components:   WBC 14.9 (*)    RBC 3.69 (*)    Hemoglobin 9.7 (*)    HCT 29.3 (*)    MCV 79.4 (*)    Neutro Abs 13.6 (*)    Lymphs Abs 0.6 (*)    Abs Immature Granulocytes 0.32 (*)    All other components within normal limits  RESP PANEL BY RT-PCR (FLU A&B, COVID) ARPGX2  CULTURE, BLOOD (ROUTINE X 2)  CULTURE, BLOOD (ROUTINE X 2)  URINE CULTURE  LACTIC ACID, PLASMA  PROTIME-INR  APTT  LACTIC ACID, PLASMA  URINALYSIS, ROUTINE W REFLEX MICROSCOPIC  I-STAT BETA HCG BLOOD, ED ([facility], [facility], [facility] ONLY)    EKG None  Radiology DG Abd Acute W/Chest  Result Date: 10/27/2020 CLINICAL DATA:  Sepsis like presentation. Myasthenia gravis and dysautonomia. EXAM: DG ABDOMEN ACUTE WITH 1 VIEW CHEST COMPARISON:  Chest x-ray 05/28/2020, CT abdomen pelvis 07/09/2020 FINDINGS: Accessed right chest wall Port-A-Cath with tip overlying the superior cavoatrial junction. Interval removal of a left chest wall dialysis catheter. Likely gastrojejunostomy tube overlies the left mid abdomen. The  heart size and mediastinal contours are within normal limits. Interval development of right lower lobe opacity as well as new retrocardiac opacity. No pulmonary edema. No pleural effusion. No pneumothorax. There is no evidence of dilated bowel loops or free intraperitoneal air. No radiopaque calculi or other significant radiographic abnormality is seen. No acute osseous abnormality. IMPRESSION: 1. Development bilateral lower lung zone airspace opacities suggestive of infection or inflammation. Followup PA and lateral chest X-ray is recommended in 3-4 weeks following therapy to ensure resolution and exclude underlying malignancy. 2. Nonobstructive bowel gas pattern. Electronically Signed   By: Normajean GlasgowMorgane  Naveau M.D.  On: 10/27/2020 01:28    Procedures .Critical Care Performed by: Cherly Anderson, PA-C Authorized by: Cherly Anderson, PA-C    CRITICAL CARE Performed by: Harvie Heck   Total critical care time: 35 minutes  Critical care time was exclusive of separately billable procedures and treating other patients.  Critical care was necessary to treat or prevent imminent or life-threatening deterioration.  Critical care was time spent personally by me on the following activities: development of treatment plan with patient and/or surrogate as well as nursing, discussions with consultants, evaluation of patient's response to treatment, examination of patient, obtaining history from patient or surrogate, ordering and performing treatments and interventions, ordering and review of laboratory studies, ordering and review of radiographic studies, pulse oximetry and re-evaluation of patient's condition.    Medications Ordered in ED Medications - No data to display  ED Course  I have reviewed the triage vital signs and the nursing notes.  Pertinent labs & imaging results that were available during my care of the patient were reviewed by me and considered in my medical decision  making (see chart for details).    MDM Rules/Calculators/A&P                          Patient presents to the ED with complaints of generally not feeling well over the past few days.  On arrival patient is febrile, tachypneic, no significant tachycardia/hypotension.  She has some decreased breath sounds noted at the left base.  Her abdomen is nontender without peritoneal signs at this time.  She has no peripheral edema.  Overall concern is for sepsis with suspected pulmonary source.  Code sepsis initiated with antibiotics as well as 30 cc/kg bolus with normal saline. Findings and plan of care discussed with supervising physician Dr. Hyacinth Meeker who has evaluated patient on arrival as shared visit.   Additional history obtained:  Additional history obtained from chart review & nursing note review.   Lab Tests:  I Ordered, reviewed, and interpreted labs, which included:  CBC: Leukocytosis of 14.9, anemia similar to prior. CMP: Several mild electrolyte derangements with hyponatremia 127, hypochloremia at 86, hypokalemia 3.1, hypocalcemia 7.2.  Mild increase in BUN and creatinine compared to prior. PT/INR/APTT: Within normal limits Pregnancy test: Negative Lactic acid: Within normal limits  Imaging Studies ordered:  I ordered imaging studies which included chest/abdominal series x-ray, I independently reviewed, formal radiology impression shows:  1. Development bilateral lower lung zone airspace opacities suggestive of infection or inflammation. Followup PA and lateral chest X-ray is recommended in 3-4 weeks following therapy to ensure resolution and exclude underlying malignancy. 2. Nonobstructive bowel gas pattern  ED Course:  Plan for admission for pneumonia with SIRS criteria concerning for sepsis.  Discussed w/ hospitalist Dr. Leafy Half- with patient's complex history patient has been recommended for admission to tertiary care facility in the future by our specialist if available- will seek  possible placement @ [facility] or alternative tertiary care.   03:30: CONSULT: Discussed with Dr. Irving Copas @ [facility]- no beds at this time, can call back tomorrow AM around 8-10AM.   03:52: CONSULT: Discussed with [facility] Transfer line- no beds currently.   04:00: CONSULT: Discussed with [facility]- no beds currently.   Re-discussed with Dr. Leafy Half, given lack of availability at local tertiary care centers currently will admit to [facility] at this time. Patient is in agreement with this, resting comfortably on re-assessment, VS improved.   Blood pressure 128/73, pulse 60,  temperature 99.5 F (37.5 C), temperature source Oral, resp. rate 18, SpO2 99 %.  Portions of this note were generated with Scientist, clinical (histocompatibility and immunogenetics). Dictation errors may occur despite best attempts at proofreading.  Final Clinical Impression(s) / ED Diagnoses Final diagnoses:  Community acquired pneumonia, unspecified laterality  Sepsis, due to unspecified organism, unspecified whether acute organ dysfunction present [facility])    Rx / DC Orders ED Discharge Orders    None       Cherly Anderson, PA-C 10/27/20 0737    Eber Hong, MD 10/27/20 1544

## 2020-10-27 ENCOUNTER — Encounter (HOSPITAL_COMMUNITY): Payer: Self-pay | Admitting: Internal Medicine

## 2020-10-27 ENCOUNTER — Emergency Department (HOSPITAL_COMMUNITY): Payer: 59

## 2020-10-27 ENCOUNTER — Other Ambulatory Visit: Payer: Self-pay

## 2020-10-27 DIAGNOSIS — R0602 Shortness of breath: Secondary | ICD-10-CM | POA: Diagnosis present

## 2020-10-27 DIAGNOSIS — E871 Hypo-osmolality and hyponatremia: Secondary | ICD-10-CM

## 2020-10-27 DIAGNOSIS — R6510 Systemic inflammatory response syndrome (SIRS) of non-infectious origin without acute organ dysfunction: Secondary | ICD-10-CM | POA: Diagnosis not present

## 2020-10-27 DIAGNOSIS — J189 Pneumonia, unspecified organism: Secondary | ICD-10-CM | POA: Diagnosis present

## 2020-10-27 DIAGNOSIS — Z993 Dependence on wheelchair: Secondary | ICD-10-CM | POA: Diagnosis not present

## 2020-10-27 DIAGNOSIS — Z681 Body mass index (BMI) 19 or less, adult: Secondary | ICD-10-CM | POA: Diagnosis not present

## 2020-10-27 DIAGNOSIS — Z9189 Other specified personal risk factors, not elsewhere classified: Secondary | ICD-10-CM | POA: Diagnosis not present

## 2020-10-27 DIAGNOSIS — I498 Other specified cardiac arrhythmias: Secondary | ICD-10-CM | POA: Diagnosis present

## 2020-10-27 DIAGNOSIS — D8940 Mast cell activation, unspecified: Secondary | ICD-10-CM

## 2020-10-27 DIAGNOSIS — Q796 Ehlers-Danlos syndrome, unspecified: Secondary | ICD-10-CM

## 2020-10-27 DIAGNOSIS — R64 Cachexia: Secondary | ICD-10-CM | POA: Diagnosis present

## 2020-10-27 DIAGNOSIS — Y833 Surgical operation with formation of external stoma as the cause of abnormal reaction of the patient, or of later complication, without mention of misadventure at the time of the procedure: Secondary | ICD-10-CM | POA: Diagnosis not present

## 2020-10-27 DIAGNOSIS — M532X2 Spinal instabilities, cervical region: Secondary | ICD-10-CM | POA: Diagnosis present

## 2020-10-27 DIAGNOSIS — G901 Familial dysautonomia [Riley-Day]: Secondary | ICD-10-CM | POA: Diagnosis present

## 2020-10-27 DIAGNOSIS — E43 Unspecified severe protein-calorie malnutrition: Secondary | ICD-10-CM | POA: Diagnosis present

## 2020-10-27 DIAGNOSIS — F4310 Post-traumatic stress disorder, unspecified: Secondary | ICD-10-CM | POA: Diagnosis present

## 2020-10-27 DIAGNOSIS — F411 Generalized anxiety disorder: Secondary | ICD-10-CM | POA: Diagnosis present

## 2020-10-27 DIAGNOSIS — L03311 Cellulitis of abdominal wall: Secondary | ICD-10-CM | POA: Diagnosis present

## 2020-10-27 DIAGNOSIS — Z87891 Personal history of nicotine dependence: Secondary | ICD-10-CM | POA: Diagnosis not present

## 2020-10-27 DIAGNOSIS — R509 Fever, unspecified: Secondary | ICD-10-CM | POA: Diagnosis present

## 2020-10-27 DIAGNOSIS — G7000 Myasthenia gravis without (acute) exacerbation: Secondary | ICD-10-CM | POA: Diagnosis present

## 2020-10-27 DIAGNOSIS — D638 Anemia in other chronic diseases classified elsewhere: Secondary | ICD-10-CM | POA: Diagnosis present

## 2020-10-27 DIAGNOSIS — E878 Other disorders of electrolyte and fluid balance, not elsewhere classified: Secondary | ICD-10-CM | POA: Diagnosis present

## 2020-10-27 DIAGNOSIS — N390 Urinary tract infection, site not specified: Secondary | ICD-10-CM | POA: Diagnosis present

## 2020-10-27 DIAGNOSIS — Z7951 Long term (current) use of inhaled steroids: Secondary | ICD-10-CM | POA: Diagnosis not present

## 2020-10-27 DIAGNOSIS — Z20822 Contact with and (suspected) exposure to covid-19: Secondary | ICD-10-CM | POA: Diagnosis present

## 2020-10-27 DIAGNOSIS — Z7952 Long term (current) use of systemic steroids: Secondary | ICD-10-CM | POA: Diagnosis not present

## 2020-10-27 DIAGNOSIS — A419 Sepsis, unspecified organism: Secondary | ICD-10-CM | POA: Diagnosis not present

## 2020-10-27 DIAGNOSIS — T85848A Pain due to other internal prosthetic devices, implants and grafts, initial encounter: Secondary | ICD-10-CM

## 2020-10-27 LAB — COMPREHENSIVE METABOLIC PANEL
ALT: 15 U/L (ref 0–44)
AST: 56 U/L — ABNORMAL HIGH (ref 15–41)
Albumin: 1.6 g/dL — ABNORMAL LOW (ref 3.5–5.0)
Alkaline Phosphatase: 45 U/L (ref 38–126)
Anion gap: 15 (ref 5–15)
BUN: 22 mg/dL — ABNORMAL HIGH (ref 6–20)
CO2: 26 mmol/L (ref 22–32)
Calcium: 7.2 mg/dL — ABNORMAL LOW (ref 8.9–10.3)
Chloride: 86 mmol/L — ABNORMAL LOW (ref 98–111)
Creatinine, Ser: 1.03 mg/dL — ABNORMAL HIGH (ref 0.44–1.00)
GFR, Estimated: >60 mL/min (ref >60)
Glucose, Bld: 87 mg/dL (ref 70–99)
Potassium: 3.1 mmol/L — ABNORMAL LOW (ref 3.5–5.1)
Sodium: 127 mmol/L — ABNORMAL LOW (ref 135–145)
Total Bilirubin: 2.1 mg/dL — ABNORMAL HIGH (ref 0.3–1.2)
Total Protein: 5.2 g/dL — ABNORMAL LOW (ref 6.5–8.1)

## 2020-10-27 LAB — CBC WITH DIFFERENTIAL/PLATELET
Abs Immature Granulocytes: 0.32 10*3/uL — ABNORMAL HIGH (ref 0.00–0.07)
Basophils Absolute: 0 10*3/uL (ref 0.0–0.1)
Basophils Relative: 0 %
Eosinophils Absolute: 0 10*3/uL (ref 0.0–0.5)
Eosinophils Relative: 0 %
HCT: 29.3 % — ABNORMAL LOW (ref 36.0–46.0)
Hemoglobin: 9.7 g/dL — ABNORMAL LOW (ref 12.0–15.0)
Immature Granulocytes: 2 %
Lymphocytes Relative: 4 %
Lymphs Abs: 0.6 10*3/uL — ABNORMAL LOW (ref 0.7–4.0)
MCH: 26.3 pg (ref 26.0–34.0)
MCHC: 33.1 g/dL (ref 30.0–36.0)
MCV: 79.4 fL — ABNORMAL LOW (ref 80.0–100.0)
Monocytes Absolute: 0.4 10*3/uL (ref 0.1–1.0)
Monocytes Relative: 3 %
Neutro Abs: 13.6 10*3/uL — ABNORMAL HIGH (ref 1.7–7.7)
Neutrophils Relative %: 91 %
Platelets: 275 10*3/uL (ref 150–400)
RBC: 3.69 MIL/uL — ABNORMAL LOW (ref 3.87–5.11)
RDW: 14.6 % (ref 11.5–15.5)
WBC: 14.9 10*3/uL — ABNORMAL HIGH (ref 4.0–10.5)
nRBC: 0 % (ref 0.0–0.2)

## 2020-10-27 LAB — RESP PANEL BY RT-PCR (FLU A&B, COVID) ARPGX2
Influenza A by PCR: NEGATIVE
Influenza B by PCR: NEGATIVE
SARS Coronavirus 2 by RT PCR: NEGATIVE

## 2020-10-27 LAB — URINALYSIS, ROUTINE W REFLEX MICROSCOPIC
Bacteria, UA: NONE SEEN
Bilirubin Urine: NEGATIVE
Glucose, UA: NEGATIVE mg/dL
Hgb urine dipstick: NEGATIVE
Ketones, ur: 5 mg/dL — AB
Leukocytes,Ua: NEGATIVE
Nitrite: NEGATIVE
Protein, ur: 30 mg/dL — AB
Specific Gravity, Urine: 1.019 (ref 1.005–1.030)
pH: 6 (ref 5.0–8.0)

## 2020-10-27 LAB — I-STAT BETA HCG BLOOD, ED (MC, WL, AP ONLY): I-stat hCG, quantitative: <5 m[IU]/mL (ref <5)

## 2020-10-27 LAB — APTT: aPTT: 31 seconds (ref 24–36)

## 2020-10-27 LAB — PROTIME-INR
INR: 1 (ref 0.8–1.2)
Prothrombin Time: 13.6 seconds (ref 11.4–15.2)

## 2020-10-27 LAB — HIV ANTIBODY (ROUTINE TESTING W REFLEX): HIV Screen 4th Generation wRfx: NONREACTIVE

## 2020-10-27 LAB — LACTIC ACID, PLASMA
Lactic Acid, Venous: 1.3 mmol/L (ref 0.5–1.9)
Lactic Acid, Venous: 1.6 mmol/L (ref 0.5–1.9)

## 2020-10-27 LAB — MAGNESIUM: Magnesium: 2.2 mg/dL (ref 1.7–2.4)

## 2020-10-27 MED ORDER — ENOXAPARIN SODIUM 40 MG/0.4ML IJ SOSY
40.0000 mg | PREFILLED_SYRINGE | INTRAMUSCULAR | Status: DC
  Filled 2020-10-27 (×4): qty 0.4

## 2020-10-27 MED ORDER — SODIUM CHLORIDE 0.9 % IV SOLN: INTRAVENOUS | Status: AC

## 2020-10-27 MED ORDER — SODIUM CHLORIDE 0.9 % IV SOLN
100.0000 mg | Freq: Two times a day (BID) | INTRAVENOUS | Status: AC
  Administered 2020-10-27 – 2020-10-31 (×9): 100 mg via INTRAVENOUS
  Filled 2020-10-27 (×11): qty 100

## 2020-10-27 MED ORDER — IPRATROPIUM-ALBUTEROL 0.5-2.5 (3) MG/3ML IN SOLN
3.0000 mL | Freq: Four times a day (QID) | RESPIRATORY_TRACT | Status: DC
  Administered 2020-10-27 (×2): 3 mL via RESPIRATORY_TRACT
  Filled 2020-10-27 (×2): qty 3

## 2020-10-27 MED ORDER — FLUTICASONE FUROATE-VILANTEROL 200-25 MCG/INH IN AEPB
1.0000 | INHALATION_SPRAY | Freq: Every day | RESPIRATORY_TRACT | Status: DC
  Administered 2020-10-28 – 2020-10-31 (×4): 1 via RESPIRATORY_TRACT
  Filled 2020-10-27 (×2): qty 28

## 2020-10-27 MED ORDER — DOXYCYCLINE HYCLATE 100 MG PO TABS
100.0000 mg | ORAL_TABLET | Freq: Once | ORAL | Status: AC
  Administered 2020-10-27: 100 mg via ORAL
  Filled 2020-10-27: qty 1

## 2020-10-27 MED ORDER — LORAZEPAM 0.5 MG PO TABS
0.5000 mg | ORAL_TABLET | Freq: Two times a day (BID) | ORAL | Status: DC | PRN
  Administered 2020-10-27 – 2020-11-06 (×17): 0.5 mg via ORAL
  Filled 2020-10-27 (×20): qty 1

## 2020-10-27 MED ORDER — ONDANSETRON HCL 4 MG/2ML IJ SOLN
4.0000 mg | Freq: Four times a day (QID) | INTRAMUSCULAR | Status: DC | PRN
Start: 2020-10-27 — End: 2020-11-06
  Administered 2020-10-28 – 2020-11-06 (×13): 4 mg via INTRAVENOUS
  Filled 2020-10-27 (×13): qty 2

## 2020-10-27 MED ORDER — SODIUM CHLORIDE 0.9% FLUSH
10.0000 mL | INTRAVENOUS | Status: DC | PRN
Start: 2020-10-27 — End: 2020-11-06
  Administered 2020-10-30: 10 mL

## 2020-10-27 MED ORDER — POTASSIUM CHLORIDE 10 MEQ/100ML IV SOLN: 10.0000 meq | INTRAVENOUS | Status: DC

## 2020-10-27 MED ORDER — BACLOFEN 10 MG PO TABS
10.0000 mg | ORAL_TABLET | Freq: Four times a day (QID) | ORAL | Status: DC
  Administered 2020-10-27 – 2020-11-06 (×41): 10 mg via ORAL
  Filled 2020-10-27 (×45): qty 1

## 2020-10-27 MED ORDER — KETOTIFEN FUMARATE 0.025 % OP SOLN
1.0000 [drp] | Freq: Every day | OPHTHALMIC | Status: DC | PRN
  Administered 2020-11-01: 1 [drp] via OPHTHALMIC
  Filled 2020-10-27 (×2): qty 5

## 2020-10-27 MED ORDER — IPRATROPIUM-ALBUTEROL 0.5-2.5 (3) MG/3ML IN SOLN: 3.0000 mL | Freq: Three times a day (TID) | RESPIRATORY_TRACT | Status: DC

## 2020-10-27 MED ORDER — CROMOLYN SODIUM 20 MG/2ML IN NEBU
20.0000 mg | INHALATION_SOLUTION | Freq: Three times a day (TID) | RESPIRATORY_TRACT | Status: DC
  Administered 2020-10-27 – 2020-11-06 (×28): 20 mg via RESPIRATORY_TRACT
  Filled 2020-10-27 (×35): qty 2

## 2020-10-27 MED ORDER — POTASSIUM CHLORIDE 20 MEQ PO PACK
40.0000 meq | PACK | Freq: Two times a day (BID) | ORAL | Status: DC
  Filled 2020-10-27: qty 2

## 2020-10-27 MED ORDER — ONDANSETRON HCL 4 MG/2ML IJ SOLN
4.0000 mg | Freq: Once | INTRAMUSCULAR | Status: AC
  Administered 2020-10-27: 4 mg via INTRAVENOUS
  Filled 2020-10-27: qty 2

## 2020-10-27 MED ORDER — PREDNISONE 20 MG PO TABS
20.0000 mg | ORAL_TABLET | Freq: Every day | ORAL | Status: DC
  Administered 2020-10-27 – 2020-11-06 (×11): 20 mg via ORAL
  Filled 2020-10-27: qty 2
  Filled 2020-10-27 (×3): qty 1
  Filled 2020-10-27: qty 2
  Filled 2020-10-27 (×9): qty 1

## 2020-10-27 MED ORDER — POTASSIUM CHLORIDE 10 MEQ/100ML IV SOLN
10.0000 meq | INTRAVENOUS | Status: AC
  Administered 2020-10-27 – 2020-10-28 (×5): 10 meq via INTRAVENOUS
  Filled 2020-10-27 (×5): qty 100

## 2020-10-27 MED ORDER — LORATADINE 10 MG PO TABS
10.0000 mg | ORAL_TABLET | Freq: Every day | ORAL | Status: DC
  Administered 2020-10-27 – 2020-10-30 (×4): 10 mg via ORAL
  Filled 2020-10-27 (×5): qty 1

## 2020-10-27 MED ORDER — IPRATROPIUM-ALBUTEROL 0.5-2.5 (3) MG/3ML IN SOLN
3.0000 mL | Freq: Three times a day (TID) | RESPIRATORY_TRACT | Status: DC
  Administered 2020-10-27 – 2020-10-29 (×4): 3 mL via RESPIRATORY_TRACT
  Filled 2020-10-27 (×5): qty 3

## 2020-10-27 MED ORDER — PYRIDOSTIGMINE BROMIDE 60 MG PO TABS
90.0000 mg | ORAL_TABLET | ORAL | Status: DC
  Administered 2020-10-27 – 2020-11-06 (×80): 90 mg via ORAL
  Filled 2020-10-27 (×94): qty 1.5

## 2020-10-27 MED ORDER — CROMOLYN SODIUM 100 MG/5ML PO CONC
200.0000 mg | Freq: Three times a day (TID) | ORAL | Status: DC
  Administered 2020-11-01 – 2020-11-06 (×19): 200 mg via ORAL
  Filled 2020-10-27 (×18): qty 10

## 2020-10-27 MED ORDER — FAMOTIDINE 20 MG PO TABS
40.0000 mg | ORAL_TABLET | Freq: Two times a day (BID) | ORAL | Status: DC
  Administered 2020-10-27 – 2020-11-06 (×20): 40 mg via ORAL
  Filled 2020-10-27 (×21): qty 2

## 2020-10-27 MED ORDER — SODIUM CHLORIDE 0.9% FLUSH
10.0000 mL | Freq: Two times a day (BID) | INTRAVENOUS | Status: DC
  Administered 2020-11-03 – 2020-11-05 (×3): 10 mL

## 2020-10-27 MED ORDER — PYRIDOSTIGMINE BROMIDE 60 MG PO TABS
90.0000 mg | ORAL_TABLET | Freq: Once | ORAL | Status: AC
  Administered 2020-10-27: 90 mg via ORAL
  Filled 2020-10-27: qty 1.5

## 2020-10-27 MED ORDER — LEVOCETIRIZINE DIHYDROCHLORIDE 5 MG PO TABS: 10.0000 mg | ORAL_TABLET | ORAL | Status: DC

## 2020-10-27 MED ORDER — ALBUTEROL SULFATE HFA 108 (90 BASE) MCG/ACT IN AERS
2.0000 | INHALATION_SPRAY | Freq: Four times a day (QID) | RESPIRATORY_TRACT | Status: DC | PRN
  Administered 2020-10-28 – 2020-10-30 (×2): 2 via RESPIRATORY_TRACT
  Filled 2020-10-27: qty 6.7

## 2020-10-27 MED ORDER — IVABRADINE HCL 5 MG PO TABS
5.0000 mg | ORAL_TABLET | Freq: Two times a day (BID) | ORAL | Status: DC
  Administered 2020-10-27 – 2020-10-31 (×9): 5 mg via ORAL
  Filled 2020-10-27 (×10): qty 1

## 2020-10-27 MED ORDER — MONTELUKAST SODIUM 10 MG PO TABS
10.0000 mg | ORAL_TABLET | Freq: Every day | ORAL | Status: DC
  Administered 2020-10-27 – 2020-10-30 (×4): 10 mg via ORAL
  Filled 2020-10-27 (×5): qty 1

## 2020-10-27 NOTE — Progress Notes (Signed)
Patient placed in observation after midnight.  Long PMHX and normally get care at [facility]: medical history of myasthenia gravis, Ehlers-Danlos syndrome, mast cell activation syndrome, POTS, protein calorie malnutrition status post PEG tube placement and dysautonomia who presents to [facility] emergency department with complaints of cough shortness of breath and fever.  A/P: Pneumonia of the bilateral lower lobes due to infectious organism  Blood cultures have been obtained  Sputum cultures have been ordered considering copious amounts of purulent sputum patient is coughing up  Considering patient's immunocompromised state additionally obtaining respiratory PCR panel  COVID-19 testing negative  Abdominal wall cellulitis surrounding PEG tube site  Per review of outpatient gastroenterology notes from 08/2020, patient is currently eating by mouth but is supposed to be using Neocate splash at least in conjunction with oral feeds  No notable areas of fluctuance on examination to suggest abscess formation  Current antibiotic regimen should provide adequate coverage for infection around the PEG site  If patient fails to rapidly improve will consider imaging of the area   Culture ordered of purulent drainage coming from PEG site    Hyponatremia  Likely secondary to volume depletion  Hydrating patient with intravenous isotonic fluid    Severe protein-calorie malnutrition (HCC)  Longstanding poor oral intake with low BMI and weight loss  Evidence of poor muscle tone/wasting on examination  Nutrition evaluation for counseling and recommendations on usage of PEG tube    Myasthenia gravis (HCC)  Continue Mestinon per outpatient regimen every 3 hours confirmed per review of outpatient notes  PT, OT evaluation  If patient strength does not rapidly improve with treatment of underlying infection, will get neurology involved for assistance in assessing for myasthenia gravis flare     Ehlers-Danlos syndrome  Outpatient follow-up    Mast cell activation syndrome (HCC)  Continue home regimen of daily prednisone, Dycal, Pepcid  Patient is continuously at high risk for reactions, particularly anaphylactic reactions  Close monitoring for drug reactions and comparing current medications with current reported sensitivities and allergy list  For personal hygiene and concern for living conditions  EMS reports poor living conditions with significant amounts of waste noted around the living space as well as evidence of human fluid/excrement on arrival  According to EMS neighbors apparently attempt to help clean the apartment.  Patient lives with her 7-year-old son that neighbors attempted to help care for, EMS recommending CPS referral  Will place case management consultation for assistance   Marlin Canary DO

## 2020-10-27 NOTE — Progress Notes (Signed)
RT NOTE: RT attempted to get patient to perform NIF/VC. Patient is very upset at this time, she states she is not getting her medications and no one comes when she presses her call bell. Patient says she is too weak and she does not want to do NIF/VC right now. RT encouraged patient to try since she has no NIF/VC on record this admission but she refuses at this time and will not work with flutter valve at this time. Patient does have strong cough and is coughing up secretions and using yankeur suction. Vitals are stable. Scheduled nebulizers given. RT will continue to monitor.

## 2020-10-27 NOTE — ED Triage Notes (Signed)
Pt BIB [facility] from home for sepsis-like presentation.  Pt has hx of myestenia-gravis and Dysautonomia according to EMS presenting in a respirator mass with "mulitple chemical sensitivities."  EMS reports clear lung sounds, but "junky cough" as well as feeling "hot to touch".  EMS reports pt was here in January and was supposed to have home health but the home is in disarray and it does not appear that anyone has been available to help on a consistent basis.   EMS report bodily fluids and medical waste/trash laying around.  EMS reports an Ativan prescription appeared to be filled 6 days ago.    Pt has 45 yr old son whom EMS reports the neighbors have tried to help take care of some as.  Neighbors also reported trying to help clean up a little. EMS reports contacting CPS to assess.  EMs also reports the pt uses an electric wheelchair at home with a talk to touch tablet.  Pt can talk but is weak and very soft-spoken.

## 2020-10-27 NOTE — ED Notes (Signed)
Attempted to call report; RN unable to take report at this time.

## 2020-10-27 NOTE — Progress Notes (Signed)
Received patient in bed. Patient is AOX3, delayed response, VSS, respiration even and unlabored. Patient is anxious and very weak. Patient skin is intact. There is some ecchymosis around her gtube.

## 2020-10-27 NOTE — Progress Notes (Signed)
Bedside ED RN Alvester Morin responded back to secure chat describing difficult situation regarding pt with many sensitivities making difficult situation drawing blood cultures. Abx's were not delayed based on difficult situation.

## 2020-10-27 NOTE — Progress Notes (Signed)
Patient tolerated breathing treatments well. Patient did NIF -15. Patient refused to do FVC at this time Patient repeatedly stated she would "try it in the morning." Patient used flutter valve with poor effort 5 times. Patient has dry cough, and vital signs are stable. RT will monitor as needed.

## 2020-10-27 NOTE — H&P (Signed)
History and Physical    Brandy [name] JYN:[id number] DOB: 1991-03-29 DOA: 10/26/2020  PCP: Pcp, No  Patient coming from: Home   Chief Complaint:  Chief Complaint  Patient presents with  . Shortness of Breath     HPI:    30 year old female with past medical history of myasthenia gravis, Ehlers-Danlos syndrome, mast cell activation syndrome, POTS, protein calorie malnutrition status post PEG tube placement and dysautonomia who presents to [facility] emergency department with complaints of cough shortness of breath and fever.  Patient is an extremely poor historian.  She is very soft-spoken and not very forthcoming with a history.  Patient explains that for approximately the past 2 to 3 weeks she has been experiencing cough.  Patient states that this cough is severe and productive with tan-colored sputum.  Patient's cough is progressively worsened over the span of the past 3 weeks and is associated with worsening shortness of breath.  Shortness of breath is worse with any exertion whatsoever and proved with rest.  Patient also complains of associated chest pain that seems to occur with deep inspiration.  Patient is also been experiencing intermittent fevers.  As patient's symptoms continue to worsen she developed associated generalized weakness that has become particularly severe.  Patient denies sick contacts, recent travel or contact with confirmed COVID-19 infection.  Patient's symptoms continue to worsen until EMS was contacted.  Upon prompt EMS arrival, patient was noted to be "hot to the touch."  EMS reports bodily fluids/excrement and poor living conditions with significant amounts of wasting trash line around the living space.  EMS also reports noting that a 29-year-old son is living in these conditions.  Neighbors present on scene reported trying to help take care of the child as well as clean up is much as possible.  Upon evaluation in the emergency department, patient has  been found to have substantial leukocytosis of 14.9 with chest x-ray revealing developing bilateral basilar infiltrates concerning for pneumonia.  Patient was found to be febrile on arrival with temperature of 101.2 F.  Patient was initiated on intravenous fluids with normal saline and initiated on intravenous antibiotics with intravenous doxycycline and ceftriaxone.  Of note, patient has been hospitalized at [facility] twice since patient's arrival to the [place] area from [place] in late 2021.  During patient's initial presentation, the attending provider and neurology mentioned " that for any further hospitalizations the patient is better served at a tertiary care center with multiple specialties available to help care for this patient as her medical diagnoses are rather complex.  Patient is in need of allergy, immunology, rheumatology all of which are not offered here [facility]   Because of this, the ER provider did attempt to seek transfer to medical bed in [facility], [facility] and [facility], all of which declined transfer due to lack of medical beds.  The hospitalist group here at [facility] was then contacted to assess the patient for admission to the hospital.  Review of Systems:   Review of Systems  Constitutional: Positive for malaise/fatigue.  Respiratory: Positive for cough, sputum production and shortness of breath.   Cardiovascular: Positive for chest pain.  Neurological: Positive for weakness.  All other systems reviewed and are negative.   Past Medical History:  Diagnosis Date  . Myasthenia gravis [facility](HCC)     Past Surgical History:  Procedure Laterality Date  . IR GJ TUBE CHANGE  07/11/2020  . IR [facility]  06/03/2020  reports that she has quit smoking. She has never used smokeless tobacco. No history on file for alcohol use and drug use.  Allergies  Allergen Reactions  . Aspirin   . Nsaids   . Quinolones   . Sulfa Antibiotics   . Telithromycin    . Fludrocortisone   . Ambien [Zolpidem]   . Amitriptyline   . Beef-Potatoes-Spinach [Compleat]   . Botulinum Toxins   . Chlorhexidine Hives  . Ciprofloxacin   . Codeine   . Contrast Media [Iodinated Diagnostic Agents]   . Creon [Pancrelipase (Lip-Prot-Amyl)]   . Cymbalta [Duloxetine Hcl]   . Dextrans   . Ergotamine   . Fluoxetine     All SSRIs All SSRIs   . Gabapentin   . Gallamine   . Hydrocodone   . Iodine   . Lactose Intolerance (Gi)   . Levofloxacin   . Lunesta [Eszopiclone]   . Macrolides And Ketolides   . Maprotiline   . Marplan [Isocarboxazid]     Any MAOI contraindicated  . Midodrine   . Morphine And Related   . Nitrous Oxide   . Oxycodone   . Pethidine [Meperidine]   . Phenergan [Promethazine]   . Phenobarbital   . Pyridium [Phenazopyridine]   . Reglan [Metoclopramide]   . Remeron [Mirtazapine]     Noradrenergic antagonist  . Salicylates   . Savella [Milnacipran]   . Scopolamine   . Sumatriptan   . Tape   . Topiramate   . Tricyclic Antidepressants   . Tums [Calcium Carbonate]     Patient reports allergic.  RN to find out adverse reaction and update allergy field.   . Vancomycin   . Verapamil     Family History  Adopted: Yes     Prior to Admission medications   Medication Sig Start Date End Date Taking? Authorizing Provider  albuterol (VENTOLIN HFA) 108 (90 Base) MCG/ACT inhaler Inhale into the lungs every 6 (six) hours as needed for wheezing or shortness of breath.   Yes [provider]  baclofen (LIORESAL) 10 MG tablet Take 1 tablet (10 mg total) by mouth See admin instructions. Take one 10 mg tablet four times a day at 6AM, 12PM, 6PM, and 12AM 07/11/20  Yes Westley Chandler, MD  CORLANOR 5 MG TABS tablet Take 1 tablet (5 mg total) by mouth 2 (two) times daily. 6am,6pm 07/11/20  Yes Westley Chandler, MD  cromolyn (GASTROCROM) 100 MG/5ML solution Take 100 mg by mouth See admin instructions. 2 ampule in water taken 4 times a day before  meals/bed and PRN   Yes [provider]  cromolyn (INTAL) 20 MG/2ML nebulizer solution Take 20 mg by nebulization See admin instructions. 1 vial taken 3 times a day and PRN   Yes [provider]  diphenhydrAMINE-zinc acetate (BENADRYL) cream Apply 1-2 application topically daily as needed (Rash from Mast Cell and Allergies).   Yes [provider]  EPINEPHrine 0.3 mg/0.3 mL IJ SOAJ injection Inject 0.3 mg into the muscle as needed for anaphylaxis.   Yes [provider]  famotidine (PEPCID) 40 MG tablet Take 40 mg by mouth See admin instructions. Take one 40 mg tablet twice a day at 6AM and 6PM.   Yes [provider]  ipratropium-albuterol (DUONEB) 0.5-2.5 (3) MG/3ML SOLN Inhale 3 mLs into the lungs See admin instructions. Qid and prn   Yes [provider]  ketotifen (ZADITOR) 0.025 % ophthalmic solution Place 1 drop into both eyes daily as needed (Mast  cell, Allergies, MCS/EI).   Yes [provider]  levocetirizine (XYZAL) 5 MG tablet Take 10 mg by mouth in the morning and at bedtime. 6am,6pm 06/01/20  Yes [provider]  LORazepam (ATIVAN) 0.5 MG tablet Take 1 tablet (0.5 mg total) by mouth 2 (two) times daily as needed for anxiety. 10/20/20  Yes Zena Amos, MD  ondansetron (ZOFRAN) 8 MG tablet Take 8 mg by mouth every 6 (six) hours as needed for nausea or vomiting.   Yes [provider]  ondansetron (ZOFRAN-ODT) 4 MG disintegrating tablet Take 4 mg by mouth every 8 (eight) hours as needed for nausea or vomiting.   Yes [provider]  Potassium Citrate 99 MG CAPS Take 99 mg by mouth daily. 6 or 9pm   Yes [provider]  predniSONE (DELTASONE) 20 MG tablet Take 20 mg by mouth daily. 08/23/20  Yes [provider]  predniSONE (DELTASONE) 5 MG tablet Take 5 tablets (25 mg total) by mouth daily with breakfast. Patient taking differently: Take 25 mg by mouth daily with breakfast. 6am 05/22/20  Yes  Lewie Chamber, MD  pyridostigmine (MESTINON) 60 MG tablet Take 1.5 tablets (90 mg total) by mouth every 3 (three) hours. 12am,3am,6am,9am,12pm,3pm,6pm,9pm 07/11/20  Yes Westley Chandler, MD  vitamin B-12 (CYANOCOBALAMIN) 1000 MCG tablet Take 1,000 mcg by mouth daily. 6 or 9pm   Yes [provider]  Vitamin D, Ergocalciferol, (DRISDOL) 1.25 MG (50000 UNIT) CAPS capsule Take 50,000 Units by mouth 2 (two) times a week.   Yes [provider]  Monte Fantasia INHUB 250-50 MCG/ACT AEPB Inhale 1 puff into the lungs 2 (two) times daily. 6am and 6pm 10/09/20  Yes [provider]  zafirlukast (ACCOLATE) 20 MG tablet Take 1 tablet (20 mg total) by mouth 2 (two) times daily before a meal. 6am 6pm 07/11/20  Yes Westley Chandler, MD    Physical Exam: Vitals:   10/26/20 2233 10/27/20 0045 10/27/20 0230 10/27/20 0256  BP: (!) 158/80 131/71 128/69 128/73  Pulse: 76   60  Resp: (!) 22   18  Temp: (!) 101.2 F (38.4 C)   99.5 F (37.5 C)  TempSrc: Rectal   Oral  SpO2: 96%   99%    Constitutional: Lethargic but arousable and oriented x3, no associated distress.,  Notable temporal wasting Skin: Notable significant purulence with mild erythema around PEG tube placement site.   notable for skin turgor noted. Eyes: Pupils are equally reactive to light.  No evidence of scleral icterus or conjunctival pallor.  ENMT: Dry mucous membranes noted.  Posterior pharynx clear of any exudate or lesions.   Neck: [place] J collar in place,no masses, no thyromegaly.  No evidence of jugular venous distension.   Respiratory: Scattered rhonchi bilaterally with bibasilar rales, no evidence of wheezing,  Normal respiratory effort. No accessory muscle use.  Cardiovascular: Regular rate and rhythm, no murmurs / rubs / gallops. No extremity edema. 2+ pedal pulses. No carotid bruits.  Chest:   Nontender without crepitus or deformity.   Back:   Nontender without crepitus or deformity. Abdomen: PEG tube is in place with  notable purulent drainage coming from PEG tube site and some surrounding erythema.  Notable tenderness around the PEG tube.  No evidence of intra-abdominal masses.  Positive bowel sounds noted in all quadrants.   Musculoskeletal: Notably poor muscle tone.  No joint deformity upper and lower extremities. Good ROM, no contractures.  Neurologic: Lethargic but arousable and oriented x3, CN 2-12 grossly intact.  Sensation intact.  Patient moving all 4 extremities spontaneously.  Patient is following all commands.  Patient is responsive to verbal stimuli.   Psychiatric: Patient exhibits defiant mood with flat affect..  Patient seems to possess insight as to their current situation.     Labs on Admission: I have personally reviewed following labs and imaging studies -   CBC: Recent Labs  Lab 10/27/20 0011  WBC 14.9*  NEUTROABS 13.6*  HGB 9.7*  HCT 29.3*  MCV 79.4*  PLT 275   Basic Metabolic Panel: Recent Labs  Lab 10/27/20 0011  NA 127*  K 3.1*  CL 86*  CO2 26  GLUCOSE 87  BUN 22*  CREATININE 1.03*  CALCIUM 7.2*   GFR: CrCl cannot be calculated (Unknown ideal weight.). Liver Function Tests: Recent Labs  Lab 10/27/20 0011  AST 56*  ALT 15  ALKPHOS 45  BILITOT 2.1*  PROT 5.2*  ALBUMIN 1.6*   No results for input(s): LIPASE, AMYLASE in the last 168 hours. No results for input(s): AMMONIA in the last 168 hours. Coagulation Profile: Recent Labs  Lab 10/27/20 0011  INR 1.0   Cardiac Enzymes: No results for input(s): CKTOTAL, CKMB, CKMBINDEX, TROPONINI in the last 168 hours. BNP (last 3 results) No results for input(s): PROBNP in the last 8760 hours. HbA1C: No results for input(s): HGBA1C in the last 72 hours. CBG: No results for input(s): GLUCAP in the last 168 hours. Lipid Profile: No results for input(s): CHOL, HDL, LDLCALC, TRIG, CHOLHDL, LDLDIRECT in the last 72 hours. Thyroid Function Tests: No results for input(s): TSH, T4TOTAL, FREET4, T3FREE, THYROIDAB in the  last 72 hours. Anemia Panel: No results for input(s): VITAMINB12, FOLATE, FERRITIN, TIBC, IRON, RETICCTPCT in the last 72 hours. Urine analysis:    Component Value Date/Time   COLORURINE AMBER (A) 10/27/2020 0420   APPEARANCEUR HAZY (A) 10/27/2020 0420   LABSPEC 1.019 10/27/2020 0420   PHURINE 6.0 10/27/2020 0420   GLUCOSEU NEGATIVE 10/27/2020 0420   HGBUR NEGATIVE 10/27/2020 0420   BILIRUBINUR NEGATIVE 10/27/2020 0420   KETONESUR 5 (A) 10/27/2020 0420   PROTEINUR 30 (A) 10/27/2020 0420   NITRITE NEGATIVE 10/27/2020 0420   LEUKOCYTESUR NEGATIVE 10/27/2020 0420    Radiological Exams on Admission - Personally Reviewed: DG Abd Acute W/Chest  Result Date: 10/27/2020 CLINICAL DATA:  Sepsis like presentation. Myasthenia gravis and dysautonomia. EXAM: DG ABDOMEN ACUTE WITH 1 VIEW CHEST COMPARISON:  Chest x-ray 05/28/2020, CT abdomen pelvis 07/09/2020 FINDINGS: Accessed right chest wall Port-A-Cath with tip overlying the superior cavoatrial junction. Interval removal of a left chest wall dialysis catheter. Likely gastrojejunostomy tube overlies the left mid abdomen. The heart size and mediastinal contours are within normal limits. Interval development of right lower lobe opacity as well as new retrocardiac opacity. No pulmonary edema. No pleural effusion. No pneumothorax. There is no evidence of dilated bowel loops or free intraperitoneal air. No radiopaque calculi or other significant radiographic abnormality is seen. No acute osseous abnormality. IMPRESSION: 1. Development bilateral lower lung zone airspace opacities suggestive of infection or inflammation. Followup PA and lateral chest X-ray is recommended in 3-4 weeks following therapy to ensure resolution and exclude underlying malignancy. 2. Nonobstructive bowel gas pattern. Electronically Signed   By: Tish Frederickson M.D.   On: 10/27/2020 01:28    EKG: Personally reviewed.  Rhythm is sinus bradycardia with heart rate of 58 bpm.  No dynamic ST  segment changes appreciated.  Assessment/Plan Principal Problem: Pneumonia of the bilateral lower lobes due to infectious organism  Patient presenting with 3-week history of cough productive with purulent sputum and shortness of breath  Notable that multiple SIRS criteria present on arrival including fever and leukocytosis without significant evidence of organ dysfunction at this point to suggest sepsis  That being said, patient suffered a profoundly immunocompromise state due to known history of POTS, myasthenia gravis and mast cell activation syndrome on chronic steroid therapy with prednisone daily  Patient has been initiated on intravenous ceftriaxone and doxycycline -choice of antibiotics is limited by patient's numerous allergies and sensitivities to drugs  Hydrating patient aggressively with intravenous isotonic fluid  Blood cultures have been obtained  Sputum cultures have been ordered considering copious amounts of purulent sputum patient is coughing up  Considering patient's immunocompromised state additionally obtaining respiratory PCR panel  COVID-19 testing negative  Supplemental oxygen as necessary for bouts of hypoxia  Active Problems:    Abdominal wall cellulitis surrounding PEG tube site   Patient is presenting with significant purulent drainage noted coming from the PEG tube site with surrounding tenderness and mild erythema  No obvious evidence of PEG malfunction at this point  Per review of outpatient gastroenterology notes from 08/2020, patient is currently eating by mouth but is supposed to be using Neocate splash at least in conjunction with oral feeds  No notable areas of fluctuance on examination to suggest abscess formation  Current antibiotic regimen should provide adequate coverage for infection around the PEG site  If patient fails to rapidly improve will consider imaging of the area   Culture ordered of purulent drainage coming from PEG  site    Hyponatremia   Likely secondary to volume depletion  Hydrating patient with intravenous isotonic fluid  Monitoring sodium levels with serial chemistries to ensure normalization  If hyponatremia fails to improve we will work-up etiology of hyponatremia    Severe protein-calorie malnutrition (HCC)   Longstanding poor oral intake with low BMI and weight loss  Evidence of poor muscle tone/wasting on examination  Nutrition evaluation for counseling and recommendations on usage of PEG tube    Myasthenia gravis (HCC)   Continue Mestinon per outpatient regimen every 3 hours confirmed per review of outpatient notes  PT, OT evaluation  If patient strength does not rapidly improve with treatment of underlying infection, will get neurology involved for assistance in assessing for myasthenia gravis flare    Ehlers-Danlos syndrome   Outpatient follow-up    Mast cell activation syndrome (HCC)   Continue home regimen of daily prednisone, Dycal, Pepcid  Patient is continuously at high risk for reactions, particularly anaphylactic reactions  Close monitoring for drug reactions and comparing current medications with current reported sensitivities and allergy list  For personal hygiene and concern for living conditions   EMS reports poor living conditions with significant amounts of waste noted around the living space as well as evidence of human fluid/excrement on arrival  According to EMS neighbors apparently attempt to help clean the apartment.  Patient lives with her 67-year-old son that neighbors attempted to help care for, EMS recommending CPS referral  Will place case management consultation for assistance  Code Status:  Full code Family Communication: deferred   Status is: Observation  The patient remains OBS appropriate and will d/c before 2 midnights.  Dispo: The patient is from: Home              Anticipated d/c is to: Home              Patient currently  is not  medically stable to d/c.   Difficult to place patient No        Marinda Elk MD [facility] Pager [phone]  If 7PM-7AM, please contact night-coverage [email or web address] Use universal [place] password for that web site. If you do not have the password, please call the hospital operator.  10/27/2020, 6:13 AM

## 2020-10-27 NOTE — Progress Notes (Signed)
Secure chat sent to bedside RN concerning need for blood cultures.

## 2020-10-28 DIAGNOSIS — Q796 Ehlers-Danlos syndrome, unspecified: Secondary | ICD-10-CM | POA: Diagnosis not present

## 2020-10-28 DIAGNOSIS — J189 Pneumonia, unspecified organism: Secondary | ICD-10-CM | POA: Diagnosis not present

## 2020-10-28 DIAGNOSIS — E871 Hypo-osmolality and hyponatremia: Secondary | ICD-10-CM | POA: Diagnosis not present

## 2020-10-28 DIAGNOSIS — Z9189 Other specified personal risk factors, not elsewhere classified: Secondary | ICD-10-CM | POA: Diagnosis not present

## 2020-10-28 LAB — COMPREHENSIVE METABOLIC PANEL
ALT: 14 U/L (ref 0–44)
AST: 33 U/L (ref 15–41)
Albumin: 1.3 g/dL — ABNORMAL LOW (ref 3.5–5.0)
Alkaline Phosphatase: 37 U/L — ABNORMAL LOW (ref 38–126)
Anion gap: 10 (ref 5–15)
BUN: 12 mg/dL (ref 6–20)
CO2: 25 mmol/L (ref 22–32)
Calcium: 7.1 mg/dL — ABNORMAL LOW (ref 8.9–10.3)
Chloride: 99 mmol/L (ref 98–111)
Creatinine, Ser: 0.56 mg/dL (ref 0.44–1.00)
GFR, Estimated: >60 mL/min (ref >60)
Glucose, Bld: 105 mg/dL — ABNORMAL HIGH (ref 70–99)
Potassium: 3.9 mmol/L (ref 3.5–5.1)
Sodium: 134 mmol/L — ABNORMAL LOW (ref 135–145)
Total Bilirubin: 2.4 mg/dL — ABNORMAL HIGH (ref 0.3–1.2)
Total Protein: 4.6 g/dL — ABNORMAL LOW (ref 6.5–8.1)

## 2020-10-28 LAB — CBC WITH DIFFERENTIAL/PLATELET
Abs Immature Granulocytes: 0 10*3/uL (ref 0.00–0.07)
Basophils Absolute: 0 10*3/uL (ref 0.0–0.1)
Basophils Relative: 0 %
Eosinophils Absolute: 0 10*3/uL (ref 0.0–0.5)
Eosinophils Relative: 0 %
HCT: 31.3 % — ABNORMAL LOW (ref 36.0–46.0)
Hemoglobin: 10.1 g/dL — ABNORMAL LOW (ref 12.0–15.0)
Lymphocytes Relative: 1 %
Lymphs Abs: 0.1 10*3/uL — ABNORMAL LOW (ref 0.7–4.0)
MCH: 26.4 pg (ref 26.0–34.0)
MCHC: 32.3 g/dL (ref 30.0–36.0)
MCV: 81.7 fL (ref 80.0–100.0)
Monocytes Absolute: 0.1 10*3/uL (ref 0.1–1.0)
Monocytes Relative: 1 %
Neutro Abs: 12.3 10*3/uL — ABNORMAL HIGH (ref 1.7–7.7)
Neutrophils Relative %: 98 %
Platelets: 261 10*3/uL (ref 150–400)
RBC: 3.83 MIL/uL — ABNORMAL LOW (ref 3.87–5.11)
RDW: 14.8 % (ref 11.5–15.5)
WBC: 12.6 10*3/uL — ABNORMAL HIGH (ref 4.0–10.5)
nRBC: 0 % (ref 0.0–0.2)
nRBC: 0 /100 WBC

## 2020-10-28 MED ORDER — SODIUM CHLORIDE 0.9 % IV SOLN
2.0000 g | Freq: Three times a day (TID) | INTRAVENOUS | Status: AC
  Administered 2020-10-28 – 2020-11-03 (×17): 2 g via INTRAVENOUS
  Filled 2020-10-28 (×17): qty 2

## 2020-10-28 MED ORDER — SODIUM CHLORIDE 0.9 % IV SOLN: INTRAVENOUS | Status: AC

## 2020-10-28 NOTE — Progress Notes (Signed)
Initial Nutrition Assessment  DOCUMENTATION CODES:  Not applicable  INTERVENTION:  Recommend pt have TPN initiated as she is severely malnourished and her home TF regimen and PO Intake are insufficient to meet her needs for maintenance and repletion. MD would like to avoid TPN at this time.  Monitor magnesium, potassium, and phosphorus daily for at least 3 days, MD to replete as needed, as pt is at risk for refeeding syndrome as TF/PO intake resume/improve or if TPN is initiated   Recommend beginning pt on thiamine  Pt is at very high risk for multiple micronutrient deficiencies; recommend checking Thiamine B1, Vitamin B12, Folate B9, Vitamin A, Vitamin D, Vitamin C, Copper, Selenium, Zinc, Iron panel, vitamin B6   Given pt's numerous food allergies, pt is declining all available tube feeding on the hospital formulary. Note pt has asked to be transferred to [facility] (where pt has been receiving care since last admission to [facility]). If pt unable to be transferred, will attempt to arrange for a hospital-grade/approved refrigerator/freezer to be placed in the pt's room so she may store her own blenderized tube feeding as arranged during previous visit. Note this will require the pt to take sole responsibility for acquiring and administering the tube feeding as [facility] are not able to provide/store what this pt requires. Will also attempt to have allergen-friendly snacks acquired for pt if transfer is unable to be completed. Limited in other options for nutrition intervention.    NUTRITION DIAGNOSIS:  Inadequate oral intake related to chronic illness (mast cell activation disorder) as evidenced by per patient/family report,meal completion < 25%.  GOAL:  Other (Comment) (provide pt with plan to help meet nutrition needs)  MONITOR:  Weight trends,Labs,I & O's  REASON FOR ASSESSMENT:  Consult Assessment of nutrition requirement/status  ASSESSMENT:  Pt with a PMH including  myasthenia gravis, Ehlers-Danlos syndrome, mast cell activation syndrome, POTS, protein calorie malnutrition s/p PEG tube placement and dysautonomia admitted with CAP and possible abdominal wall cellulitis surrounding PEG tube site.   Sputum cultures pending. Note pt also with possible UTI per MD.   Pt unavailable at time of RD visit x2 attempts. Discussed pt with RN. Pt noted to be refusing meals. Pt well known to this RD from previous admission. Pt has a very limited po diet due to numerous food allergies; typical diet consists of the following: celery, pears, rutabaga, rice, lentils, beans (dried not canned), homemade cassava root chips, and brussels sprouts, rice pasta, rice crisps, steamed cabbage. Pt supplements her po diet with of blenderized tube feeding she prepares herself at home. TF consists of celery, pears, rutabaga, rice, lentils, water. Pt has also been using Neocate splash. Note pt has been seeing outpatient RD (referred by this writer after previous admission) through [facility] in addition to a dietitian through the [facility] system. Pt was noted to be working towards increasing her "safe" foods list with the outpatient dietitians, though it is unclear at this time if pt has been successful in doing so.   Given pt's numerous food allergies, pt is declining all available tube feeding on the hospital formulary. Note pt has asked to be transferred to [facility] (where pt has been receiving care since last admission to [facility]). If pt unable to be transferred, will attempt to arrange for a hospital-grade/approved refrigerator/freezer to be placed in the pt's room so she may store her own blenderized tube feeding as arranged during previous visit. Note this will require the pt to take  sole responsibility for acquiring and administering the tube feeding as [facility] are not able to provide/store what this pt requires. Will also attempt to have allergen-friendly snacks acquired  for pt if transfer is unable to be completed. Limited in other options for nutrition intervention.   Pt's current home TF regimen and PO intake are insufficient to meet the patient's calorie and protein needs, and it is very unlikely that the pt will be able to meet her needs via TF and PO intake given her many dietary restrictions. Recommend pt have TPN initiated to meet her needs for maintenance and repletion as pt likely still malnourished (diagnosed as severely malnourished during previous admission and BMI is 18.54; will attempt nutrition-focused physical exam upon follow-up to confirm diagnosis). This would be best initiated in the hospital as pt is at very high risk of refeeding syndrome and needs close monitoring with continued TPN after discharge.   Given pt has not been meeting her nutritional needs for an extended amount of time, recommend checking the following vitamins/minerals to look for any micronutrient deficiencies: Thiamine B1, Vitamin B12, Folate B9, Vitamin A, Vitamin D, Vitamin C, Copper, Selenium, Zinc, Iron panel, B6.  Per weight readings, pt has gained 11% of her bodyweight back over the last 5-6 months (was 44.2kg in Nov/Dec 2021 and now 49kg)  UOP: documented today  Medications: gastrocrom, pepcid, deltasone Labs: Na 134 (L)  Diet Order:   Diet Order            Diet regular Room service appropriate? Yes; Fluid consistency: Thin  Diet effective now                 EDUCATION NEEDS:  No education needs have been identified at this time  Skin:  Skin Assessment: Reviewed RN Assessment  Last BM:  5/10  Height:  Ht Readings from Last 1 Encounters:  07/09/20 5\' 4"  (1.626 m)   Weight:  Wt Readings from Last 1 Encounters:  10/28/20 49 kg   BMI:  Body mass index is 18.54 kg/m.  Estimated Nutritional Needs:  Kcal:  1750-1950 Protein:  85-95g Fluid:  >1.75L/d    01/12/2021, MS, RD, LDN RD pager number and weekend/on-call pager number located  in Amion.

## 2020-10-28 NOTE — Progress Notes (Signed)
Patient expressing to leave AMA. Will update MD.

## 2020-10-28 NOTE — Progress Notes (Signed)
Pharmacy Antibiotic Note  Brandy [name] is a 30 y.o. female admitted on 10/26/2020 with complaints of cough, shortness of breath, and fever.  Pt was initially started on Rocephin and Doxycycline.  Pharmacy has been consulted to switch rocephin to Cefepime for broader PNA coverage.    Plan: D/C Rocephin Cefepime 2gm IV q8h Follow-up cx data and clinical progress.    Temp (24hrs), Avg:97.8 F (36.6 C), Min:97.5 F (36.4 C), Max:98.4 F (36.9 C)  Recent Labs  Lab 10/27/20 0011 10/27/20 0938 10/28/20 0255  WBC 14.9*  --  12.6*  CREATININE 1.03*  --  0.56  LATICACIDVEN 1.6 1.3  --     CrCl cannot be calculated (Unknown ideal weight.).    Allergies  Allergen Reactions  . Aspirin   . Nsaids   . Quinolones   . Sulfa Antibiotics   . Telithromycin   . Fludrocortisone   . Ambien [Zolpidem]   . Amitriptyline   . Beef-Potatoes-Spinach [Compleat]   . Botulinum Toxins   . Chlorhexidine Hives  . Ciprofloxacin   . Codeine   . Contrast Media [Iodinated Diagnostic Agents]   . Creon [Pancrelipase (Lip-Prot-Amyl)]   . Cymbalta [Duloxetine Hcl]   . Dextrans   . Ergotamine   . Fluoxetine     All SSRIs All SSRIs   . Gabapentin   . Gallamine   . Hydrocodone   . Iodine   . Lactose Intolerance (Gi)   . Levofloxacin   . Lunesta [Eszopiclone]   . Macrolides And Ketolides   . Maprotiline   . Marplan [Isocarboxazid]     Any MAOI contraindicated  . Midodrine   . Morphine And Related   . Nitrous Oxide   . Oxycodone   . Pethidine [Meperidine]   . Phenergan [Promethazine]   . Phenobarbital   . Pyridium [Phenazopyridine]   . Reglan [Metoclopramide]   . Remeron [Mirtazapine]     Noradrenergic antagonist  . Salicylates   . Savella [Milnacipran]   . Scopolamine   . Sumatriptan   . Tape   . Topiramate   . Tricyclic Antidepressants   . Tums [Calcium Carbonate]     Patient reports allergic.  RN to find out adverse reaction and update allergy field.   . Vancomycin   .  Verapamil     Antimicrobials this admission: CTX 5/10 >5/11 Cefepime 5/11 >> Doxycycline 5/10 >>  Dose adjustments this admission:   Microbiology results: 5/10 BCx: ngtd 5/10 UCx: 30K PSA   Thank you for allowing pharmacy to be a part of this patient's care.  [employer], Pharm.D., BCPS Clinical Pharmacist  **Pharmacist phone directory can be found on [email or web address] listed under [facility].  10/28/2020 2:17 PM

## 2020-10-28 NOTE — Progress Notes (Signed)
PROGRESS NOTE  Brandy [name] ZOX:[id number] DOB: 1990/12/16 DOA: 10/26/2020 PCP: Pcp, No  HPI/Recap of past 24 hours: HPI from Dr Leafy Half  30 year old female with past medical history of myasthenia gravis, Ehlers-Danlos syndrome, mast cell activation syndrome, POTS, protein calorie malnutrition status post PEG tube placement and dysautonomia who presents with complaints of cough, shortness of breath and fever. Patient is an extremely poor historian. Patient explains that for approximately the past 2 to 3 weeks she has been experiencing productive cough of tan-colored sputum, associated with worsening shortness of breath, pleuritic chest pain, subjective intermittent fevers with generalized weakness. Upon EMS arrival, patient was noted to be "hot to the touch."  EMS reports bodily fluids/excrement and poor living conditions with significant amounts of trash around the living space.  EMS also reports noting that a 4-year-old son is living in these conditions.  Neighbors present on scene reported trying to help take care of the child as well as clean up as much as possible. In the ED, patient has been found to have substantial leukocytosis of 14.9 with chest x-ray revealing developing bilateral basilar infiltrates concerning for pneumonia.  Patient was found to be febrile on arrival with temperature of 101.2 F.  Patient was initiated on intravenous fluids with normal saline and initiated on intravenous antibiotics with intravenous doxycycline and ceftriaxone.  Of note, patient has been hospitalized at [facility] twice since patient's arrival to the [place] area from [place] in late 2021.  During patient's initial presentation, the attending provider and neurology mentioned " that for any further hospitalizations the patient is better served at a tertiary care center with multiple specialties available to help care for this patient as her medical diagnoses are rather complex.  Patient is in  need of allergy, immunology, rheumatology all of which are not offered here [facility])."   Because of this, the ER provider did attempt to seek transfer to medical bed in [facility], [facility] and [facility], all of which declined transfer due to lack of medical beds.  The hospitalist group here at [facility] was then contacted to assess the patient for admission to the hospital.   Today, patient had multiple complaints about her care, reports productive cough, denies any chest pain, abdominal pain, nausea/vomiting, fever/chills.  Patient complaining about her medications, and the need to be transferred to [facility].  Assessment/Plan: Principal Problem:   SIRS (systemic inflammatory response syndrome) (HCC) Active Problems:   Severe protein-calorie malnutrition (HCC)   Myasthenia gravis (HCC)   Ehlers-Danlos syndrome   Mast cell activation syndrome (HCC)   POTS (postural orthostatic tachycardia syndrome)   Dysautonomia (HCC)   Pneumonia of both lower lobes due to infectious organism   Pain around PEG tube site   Hyponatremia   History of poor personal hygiene   Fever   CAP Bilateral lower lobe pneumonia Afebrile, with leukocytosis BC x2 NGTD Sputum cultures pending Chest x-ray showed bilateral lower lung zone airspace opacity suggestive of infection Switch ceftriaxone to cefepime for broader coverage, continue doxycycline Continue supplemental O2 Continue IV fluids  Possible abdominal wall cellulitis surrounding PEG tube site Reports purulent drainage from PEG tube site, with some mild erythema, this is currently improving Patient able to eat by mouth, but uses PEG tube for supplemental feeding with [place] splash Continue antibiotics as above May need imaging if no significant improvement  ?Possible UTI UA unremarkable UC grew 30,000 Pseudomonas aeruginosa (immunosuppressed) Switch to cefepime  Hyponatremia Improving with IV fluids, continue Daily BMP  Anemia  of chronic disease Baseline  around 9-11 Anemia panel pending Daily CBC  Myasthenia gravis Continue home Mestinon PT/OT Consider neurology for possible myasthenia gravis flare if no improvement in the next couple of days  Mast cell activation syndrome Continue home prednisone, Dical, Pepcid At high risk for reactions, particularly anaphylactic reactions Close monitoring  Ehlers-Danlos syndrome Outpatient follow-up  Social issues/poor living condition EMS reports human fluids/excrement noted around the house Patient also lives with her 53-year-old son in this poor living condition Social worker consulted  Severe protein-calorie malnutrition Estimated body mass index is 18.54 kg/m as calculated from the following:   Height as of 07/09/20: 5\' 4"  (1.626 m).   Weight as of 07/09/20: 49 kg.  Dietitian consulted     Code Status: Full  Family Communication: None at bedside  Disposition Plan: Status is: Inpatient  Remains inpatient appropriate because:Inpatient level of care appropriate due to severity of illness   Dispo: The patient is from: Home              Anticipated d/c is to: Home              Patient currently is not medically stable to d/c.   Difficult to place patient No   Consultants:  None  Procedures:  None  Antimicrobials:  Cefepime  Doxycycline  DVT prophylaxis: Lovenox   Objective: Vitals:   10/27/20 2246 10/28/20 0602 10/28/20 0837 10/28/20 0907  BP: 108/85 (!) 143/99  124/70  Pulse: (!) 45 (!) 54  (!) 55  Resp: 18 18  18   Temp:  97.6 F (36.4 C)  (!) 97.5 F (36.4 C)  TempSrc:    Oral  SpO2: 100% 98% 98% 99%    Intake/Output Summary (Last 24 hours) at 10/28/2020 1248 Last data filed at 10/28/2020 0556 Gross per 24 hour  Intake 2775.6 ml  Output --  Net 2775.6 ml   There were no vitals filed for this visit.  Exam:  General: NAD, cachectic, frail-appearing, thin  Cardiovascular: S1, S2 present  Respiratory: CTAB  Abdomen: Soft, nontender,  nondistended, bowel sounds present, G-tube noted with surrounding erythema  Musculoskeletal: No bilateral pedal edema noted  Skin: Normal  Psychiatry: Normal mood   Data Reviewed: CBC: Recent Labs  Lab 10/27/20 0011 10/28/20 0255  WBC 14.9* 12.6*  NEUTROABS 13.6* 12.3*  HGB 9.7* 10.1*  HCT 29.3* 31.3*  MCV 79.4* 81.7  PLT 275 261   Basic Metabolic Panel: Recent Labs  Lab 10/27/20 0011 10/28/20 0255  NA 127* 134*  K 3.1* 3.9  CL 86* 99  CO2 26 25  GLUCOSE 87 105*  BUN 22* 12  CREATININE 1.03* 0.56  CALCIUM 7.2* 7.1*  MG 2.2  --    GFR: CrCl cannot be calculated (Unknown ideal weight.). Liver Function Tests: Recent Labs  Lab 10/27/20 0011 10/28/20 0255  AST 56* 33  ALT 15 14  ALKPHOS 45 37*  BILITOT 2.1* 2.4*  PROT 5.2* 4.6*  ALBUMIN 1.6* 1.3*   No results for input(s): LIPASE, AMYLASE in the last 168 hours. No results for input(s): AMMONIA in the last 168 hours. Coagulation Profile: Recent Labs  Lab 10/27/20 0011  INR 1.0   Cardiac Enzymes: No results for input(s): CKTOTAL, CKMB, CKMBINDEX, TROPONINI in the last 168 hours. BNP (last 3 results) No results for input(s): PROBNP in the last 8760 hours. HbA1C: No results for input(s): HGBA1C in the last 72 hours. CBG: No results for input(s): GLUCAP in the last 168 hours. Lipid Profile:  No results for input(s): CHOL, HDL, LDLCALC, TRIG, CHOLHDL, LDLDIRECT in the last 72 hours. Thyroid Function Tests: No results for input(s): TSH, T4TOTAL, FREET4, T3FREE, THYROIDAB in the last 72 hours. Anemia Panel: No results for input(s): VITAMINB12, FOLATE, FERRITIN, TIBC, IRON, RETICCTPCT in the last 72 hours. Urine analysis:    Component Value Date/Time   COLORURINE AMBER (A) 10/27/2020 0420   APPEARANCEUR HAZY (A) 10/27/2020 0420   LABSPEC 1.019 10/27/2020 0420   PHURINE 6.0 10/27/2020 0420   GLUCOSEU NEGATIVE 10/27/2020 0420   HGBUR NEGATIVE 10/27/2020 0420   BILIRUBINUR NEGATIVE 10/27/2020 0420    KETONESUR 5 (A) 10/27/2020 0420   PROTEINUR 30 (A) 10/27/2020 0420   NITRITE NEGATIVE 10/27/2020 0420   LEUKOCYTESUR NEGATIVE 10/27/2020 0420   Sepsis Labs: @LABRCNTIP (procalcitonin:4,lacticidven:4)  ) Recent Results (from the past 240 hour(s))  Resp Panel by RT-PCR (Flu A&B, Covid) Nasopharyngeal Swab     Status: None   Collection Time: 10/27/20 12:11 AM   Specimen: Nasopharyngeal Swab; Nasopharyngeal(NP) swabs in vial transport medium  Result Value Ref Range Status   SARS Coronavirus 2 by RT PCR NEGATIVE NEGATIVE Final    Comment: (NOTE) SARS-CoV-2 target nucleic acids are NOT DETECTED.  The SARS-CoV-2 RNA is generally detectable in upper respiratory specimens during the acute phase of infection. The lowest concentration of SARS-CoV-2 viral copies this assay can detect is 138 copies/mL. A negative result does not preclude SARS-Cov-2 infection and should not be used as the sole basis for treatment or other patient management decisions. A negative result may occur with  improper specimen collection/handling, submission of specimen other than nasopharyngeal swab, presence of viral mutation(s) within the areas targeted by this assay, and inadequate number of viral copies(<138 copies/mL). A negative result must be combined with clinical observations, patient history, and epidemiological information. The expected result is Negative.  Fact Sheet for Patients:  BloggerCourse.comhttps://www.fda.gov/media/152166/download  Fact Sheet for Healthcare Providers:  SeriousBroker.ithttps://www.fda.gov/media/152162/download  This test is no t yet approved or cleared by the [place] FDA and  has been authorized for detection and/or diagnosis of SARS-CoV-2 by FDA under an Emergency Use Authorization (EUA). This EUA will remain  in effect (meaning this test can be used) for the duration of the COVID-19 declaration under Section 564(b)(1) of the Act, 21 U.S.C.section 360bbb-3(b)(1), unless the authorization is terminated   or revoked sooner.       Influenza A by PCR NEGATIVE NEGATIVE Final   Influenza B by PCR NEGATIVE NEGATIVE Final    Comment: (NOTE) The Xpert Xpress SARS-CoV-2/FLU/RSV plus assay is intended as an aid in the diagnosis of influenza from Nasopharyngeal swab specimens and should not be used as a sole basis for treatment. Nasal washings and aspirates are unacceptable for Xpert Xpress SARS-CoV-2/FLU/RSV testing.  Fact Sheet for Patients: BloggerCourse.comhttps://www.fda.gov/media/152166/download  Fact Sheet for Healthcare Providers: SeriousBroker.ithttps://www.fda.gov/media/152162/download  This test is not yet approved or cleared by the [place] FDA and has been authorized for detection and/or diagnosis of SARS-CoV-2 by FDA under an Emergency Use Authorization (EUA). This EUA will remain in effect (meaning this test can be used) for the duration of the COVID-19 declaration under Section 564(b)(1) of the Act, 21 U.S.C. section 360bbb-3(b)(1), unless the authorization is terminated or revoked.  Performed at [facility], [address] [place], [place] [id number]   Blood Culture (routine x 2)     Status: None (Preliminary result)   Collection Time: 10/27/20  3:15 AM   Specimen: BLOOD  Result Value Ref Range Status  Specimen Description BLOOD LEFT ARM  Final   Special Requests   Final    BOTTLES DRAWN AEROBIC AND ANAEROBIC Blood Culture results may not be optimal due to an inadequate volume of blood received in culture bottles   Culture   Final    NO GROWTH 1 DAY Performed at [facility], [address] [place], [place] [address]    Report Status PENDING  Incomplete  Urine culture     Status: Abnormal (Preliminary result)   Collection Time: 10/27/20  4:20 AM   Specimen: In/Out Cath Urine  Result Value Ref Range Status   Specimen Description IN/OUT CATH URINE  Final   Special Requests NONE  Final   Culture (A)  Final    30,000 COLONIES/mL PSEUDOMONAS AERUGINOSA SUSCEPTIBILITIES TO  FOLLOW Performed at [facility], [address] [place], [place] [address]    Report Status PENDING  Incomplete  Blood Culture (routine x 2)     Status: None (Preliminary result)   Collection Time: 10/27/20  9:38 AM   Specimen: BLOOD  Result Value Ref Range Status   Specimen Description BLOOD RIGHT ANTECUBITAL  Final   Special Requests   Final    BOTTLES DRAWN AEROBIC AND ANAEROBIC Blood Culture adequate volume   Culture   Final    NO GROWTH < 24 HOURS Performed at [facility], [address], [place], [place] [address]    Report Status PENDING  Incomplete      Studies: No results found.  Scheduled Meds: . acetaminophen  1,000 mg Oral Once  . baclofen  10 mg Oral QID  . cromolyn  100 mg Oral See admin instructions  . cromolyn  20 mg Nebulization TID  . enoxaparin (LOVENOX) injection  40 mg Subcutaneous Q24H  . famotidine  40 mg Oral BID  . fluticasone furoate-vilanterol  1 puff Inhalation Daily  . ipratropium-albuterol  3 mL Inhalation TID  . ivabradine  5 mg Oral BID  . loratadine  10 mg Oral Daily  . montelukast  10 mg Oral QHS  . predniSONE  20 mg Oral Daily  . pyridostigmine  90 mg Oral Q3H  . sodium chloride flush  10-40 mL Intracatheter Q12H    Continuous Infusions: . cefTRIAXone (ROCEPHIN)  IV 2 g (10/27/20 2250)  . doxycycline (VIBRAMYCIN) IV 100 mg (10/28/20 0905)     LOS: 1 day     Briant Cedar, MD [facility]  If 7PM-7AM, please contact night-coverage [email or web address] 10/28/2020, 12:48 PM

## 2020-10-28 NOTE — Progress Notes (Signed)
RT NOTE:  Pt tolerated breathing treatments tonight. Pt agreed to do NIF, however, she would not make a seal around device. Pt cried that she did not want to do NIF or VC. Pt did use flutter x 5 with poor effort. Pt crying and saying she does not want to be here. RT explained that the treatments and NIF/VC & flutter we to monitor and help improve her breathing. Pt refused.

## 2020-10-28 NOTE — Progress Notes (Signed)
Pt tolerated scheduled nebulizer treatments well. SpO2 96% on 2L. Pt with weak, dry cough. Pt refuses to do flutter and NIF/VC at this time. Pt instructed on importance and then began to get upset. Food tray removed per pt request as it was making her nauseous. Pt with Albuterol MDI with spacer in bed. Instructed pt that she is only ordered 2p Q6 PRN. Pt states she has been using it when she wants. Pt continues to keep MDI and will not allow RT to place at computer. RT again instructed pt on MDI use to which she began to get upset again. RT will continue to monitor and be available as needed.

## 2020-10-28 NOTE — Progress Notes (Signed)
Patient refusing telemetry. Patient claims she is allergic to it.

## 2020-10-29 DIAGNOSIS — Z9189 Other specified personal risk factors, not elsewhere classified: Secondary | ICD-10-CM | POA: Diagnosis not present

## 2020-10-29 DIAGNOSIS — E871 Hypo-osmolality and hyponatremia: Secondary | ICD-10-CM | POA: Diagnosis not present

## 2020-10-29 DIAGNOSIS — Q796 Ehlers-Danlos syndrome, unspecified: Secondary | ICD-10-CM | POA: Diagnosis not present

## 2020-10-29 DIAGNOSIS — J189 Pneumonia, unspecified organism: Secondary | ICD-10-CM | POA: Diagnosis not present

## 2020-10-29 LAB — URINE CULTURE: Culture: 30000 — AB

## 2020-10-29 LAB — IRON AND TIBC
Iron: 25 ug/dL — ABNORMAL LOW (ref 28–170)
Saturation Ratios: 15 % (ref 10.4–31.8)
TIBC: 164 ug/dL — ABNORMAL LOW (ref 250–450)
UIBC: 139 ug/dL

## 2020-10-29 LAB — CBC WITH DIFFERENTIAL/PLATELET
Abs Immature Granulocytes: 0.51 10*3/uL — ABNORMAL HIGH (ref 0.00–0.07)
Basophils Absolute: 0 10*3/uL (ref 0.0–0.1)
Basophils Relative: 0 %
Eosinophils Absolute: 0 10*3/uL (ref 0.0–0.5)
Eosinophils Relative: 0 %
HCT: 27.8 % — ABNORMAL LOW (ref 36.0–46.0)
Hemoglobin: 8.7 g/dL — ABNORMAL LOW (ref 12.0–15.0)
Immature Granulocytes: 4 %
Lymphocytes Relative: 7 %
Lymphs Abs: 0.8 10*3/uL (ref 0.7–4.0)
MCH: 26.1 pg (ref 26.0–34.0)
MCHC: 31.3 g/dL (ref 30.0–36.0)
MCV: 83.5 fL (ref 80.0–100.0)
Monocytes Absolute: 0.6 10*3/uL (ref 0.1–1.0)
Monocytes Relative: 5 %
Neutro Abs: 9.6 10*3/uL — ABNORMAL HIGH (ref 1.7–7.7)
Neutrophils Relative %: 84 %
Platelets: 269 10*3/uL (ref 150–400)
RBC: 3.33 MIL/uL — ABNORMAL LOW (ref 3.87–5.11)
RDW: 15.1 % (ref 11.5–15.5)
WBC: 11.5 10*3/uL — ABNORMAL HIGH (ref 4.0–10.5)
nRBC: 0 % (ref 0.0–0.2)

## 2020-10-29 LAB — BASIC METABOLIC PANEL
Anion gap: 10 (ref 5–15)
BUN: 15 mg/dL (ref 6–20)
CO2: 24 mmol/L (ref 22–32)
Calcium: 7.5 mg/dL — ABNORMAL LOW (ref 8.9–10.3)
Chloride: 101 mmol/L (ref 98–111)
Creatinine, Ser: 0.66 mg/dL (ref 0.44–1.00)
GFR, Estimated: >60 mL/min (ref >60)
Glucose, Bld: 93 mg/dL (ref 70–99)
Potassium: 3.6 mmol/L (ref 3.5–5.1)
Sodium: 135 mmol/L (ref 135–145)

## 2020-10-29 LAB — FOLATE: Folate: 10.9 ng/mL (ref >5.9)

## 2020-10-29 LAB — VITAMIN B12: Vitamin B-12: 3093 pg/mL — ABNORMAL HIGH (ref 180–914)

## 2020-10-29 LAB — FERRITIN: Ferritin: 206 ng/mL (ref 11–307)

## 2020-10-29 MED ORDER — IPRATROPIUM-ALBUTEROL 0.5-2.5 (3) MG/3ML IN SOLN
3.0000 mL | Freq: Two times a day (BID) | RESPIRATORY_TRACT | Status: DC
  Administered 2020-10-29 – 2020-11-04 (×12): 3 mL via RESPIRATORY_TRACT
  Filled 2020-10-29 (×13): qty 3

## 2020-10-29 MED ORDER — SODIUM CHLORIDE 0.9 % IV SOLN: INTRAVENOUS | Status: AC

## 2020-10-29 NOTE — Progress Notes (Signed)
Pt did -5 on NIF. MD notified.

## 2020-10-29 NOTE — Progress Notes (Signed)
PROGRESS NOTE  Braylin Formby ENI:[id number] DOB: 1990-10-25 DOA: 10/26/2020 PCP: Pcp, No  HPI/Recap of past 24 hours: HPI from Dr Leafy Half  30 year old female with past medical history of myasthenia gravis, Ehlers-Danlos syndrome, mast cell activation syndrome, POTS, protein calorie malnutrition status post PEG tube placement and dysautonomia who presents with complaints of cough, shortness of breath and fever. Patient is an extremely poor historian. Patient explains that for approximately the past 2 to 3 weeks she has been experiencing productive cough of tan-colored sputum, associated with worsening shortness of breath, pleuritic chest pain, subjective intermittent fevers with generalized weakness. Upon EMS arrival, patient was noted to be "hot to the touch."  EMS reports bodily fluids/excrement and poor living conditions with significant amounts of trash around the living space.  EMS also reports noting that a 67-year-old son is living in these conditions.  Neighbors present on scene reported trying to help take care of the child as well as clean up as much as possible. In the ED, patient has been found to have substantial leukocytosis of 14.9 with chest x-ray revealing developing bilateral basilar infiltrates concerning for pneumonia.  Patient was found to be febrile on arrival with temperature of 101.2 F.  Patient was initiated on intravenous fluids with normal saline and initiated on intravenous antibiotics with intravenous doxycycline and ceftriaxone.  Of note, patient has been hospitalized at [facility] twice since patient's arrival to the [place] area from [place] in late 2021.  During patient's initial presentation, the attending provider and neurology mentioned " that for any further hospitalizations the patient is better served at a tertiary care center with multiple specialties available to help care for this patient as her medical diagnoses are rather complex.  Patient is in  need of allergy, immunology, rheumatology all of which are not offered here [facility])."   Because of this, the ER provider did attempt to seek transfer to medical bed in [facility], [facility] and [facility], all of which declined transfer due to lack of medical beds.  The hospitalist group here at [facility] was then contacted to assess the patient for admission to the hospital.    Today, patient continues to have multiple complaints about care, noted to be tearful as well.  Patient wants to be transferred over to [facility] as all her physicians are over there.  Patient denies any worsening cough, shortness of breath, abdominal pain, nausea/vomiting, fever/chills.  Currently no beds at [facility].  Waiting to hear back from [facility] about bed updates    Assessment/Plan: Principal Problem:   SIRS (systemic inflammatory response syndrome) (HCC) Active Problems:   Severe protein-calorie malnutrition (HCC)   Myasthenia gravis (HCC)   Ehlers-Danlos syndrome   Mast cell activation syndrome (HCC)   POTS (postural orthostatic tachycardia syndrome)   Dysautonomia (HCC)   Pneumonia of both lower lobes due to infectious organism   Pain around PEG tube site   Hyponatremia   History of poor personal hygiene   Fever   CAP Bilateral lower lobe pneumonia Afebrile, with leukocytosis BC x2 NGTD Sputum cultures pending Chest x-ray showed bilateral lower lung zone airspace opacity suggestive of infection Continue cefepime for broader coverage, continue doxycycline Continue supplemental O2 Continue IV fluids  Possible abdominal wall cellulitis surrounding PEG tube site Reports purulent drainage from PEG tube site, with some mild erythema, this is currently improving Patient also reports PEG tube has not been functioning-completely refuses for IR from our facility to take a look, wants [facility] to be [facility] Patient able to eat by mouth, but uses PEG tube for supplemental feeding with [place] splash Continue antibiotics  as above  ?Possible UTI UA unremarkable UC grew 30,000 Pseudomonas aeruginosa (immunosuppressed) Switched to cefepime  Hyponatremia Improving with IV fluids, continue Daily BMP  Anemia of chronic disease Baseline around 9-11 Anemia panel showed iron 25, sats 15, ferritin 206, folate 10.9, vitamin B12 greater than 3000 Daily CBC  Myasthenia gravis Continue home [place] PT/OT Consider neurology for possible myasthenia gravis flare if no improvement in the next couple of days  Mast cell activation syndrome Continue home prednisone, Dical, Pepcid At high risk for reactions, particularly anaphylactic reactions Close monitoring  Ehlers-Danlos syndrome Outpatient follow-up  Social issues/poor living condition EMS reports human fluids/excrement noted around the house Patient also lives with her 29-year-old son in this poor living condition Social worker/CPS consulted  Severe protein-calorie malnutrition Estimated body mass index is 18.54 kg/m as calculated from the following:   Height as of 07/09/20: 5\' 4"  (1.626 m).   Weight as of this encounter: 49 kg.  Dietitian consulted     Code Status: Full  Family Communication: None at bedside  Disposition Plan: Status is: Inpatient  Remains inpatient appropriate because:Inpatient level of care appropriate due to severity of illness   Dispo: The patient is from: Home              Anticipated d/c is to: Home              Patient currently is not medically stable to d/c.   Difficult to place patient No   Consultants:  None  Procedures:  None  Antimicrobials:  Cefepime  Doxycycline  DVT prophylaxis: Lovenox   Objective: Vitals:   10/28/20 1802 10/28/20 2010 10/29/20 0637 10/29/20 0923  BP: 133/87  119/74 122/84  Pulse: (!) 50 (!) 50 (!) 55 (!) 57  Resp: 17 17 16 18   Temp: 97.6 F (36.4 C)  97.7 F (36.5 C) 97.6 F (36.4 C)  TempSrc: Oral  Oral Oral  SpO2: 100% 100% 100% 100%  Weight:         Intake/Output Summary (Last 24 hours) at 10/29/2020 1823 Last data filed at 10/29/2020 0900 Gross per 24 hour  Intake 0 ml  Output 50 ml  Net -50 ml   Filed Weights   10/28/20 1400  Weight: 49 kg    Exam:  General: NAD, cachectic, frail-appearing, thin  Cardiovascular: S1, S2 present  Respiratory: CTAB  Abdomen: Soft, nontender, nondistended, bowel sounds present, G-tube noted with surrounding erythema  Musculoskeletal: No bilateral pedal edema noted  Skin: Normal  Psychiatry: Teary mood   Data Reviewed: CBC: Recent Labs  Lab 10/27/20 0011 10/28/20 0255 10/29/20 0349  WBC 14.9* 12.6* 11.5*  NEUTROABS 13.6* 12.3* 9.6*  HGB 9.7* 10.1* 8.7*  HCT 29.3* 31.3* 27.8*  MCV 79.4* 81.7 83.5  PLT 275 261 269   Basic Metabolic Panel: Recent Labs  Lab 10/27/20 0011 10/28/20 0255 10/29/20 0349  NA 127* 134* 135  K 3.1* 3.9 3.6  CL 86* 99 101  CO2 26 25 24   GLUCOSE 87 105* 93  BUN 22* 12 15  CREATININE 1.03* 0.56 0.66  CALCIUM 7.2* 7.1* 7.5*  MG 2.2  --   --    GFR: Estimated Creatinine Clearance: 80.3 mL/min (by C-G formula based on SCr of 0.66 mg/dL). Liver Function Tests: Recent Labs  Lab 10/27/20 0011 10/28/20 0255  AST 56* 33  ALT 15 14  ALKPHOS  45 37*  BILITOT 2.1* 2.4*  PROT 5.2* 4.6*  ALBUMIN 1.6* 1.3*   No results for input(s): LIPASE, AMYLASE in the last 168 hours. No results for input(s): AMMONIA in the last 168 hours. Coagulation Profile: Recent Labs  Lab 10/27/20 0011  INR 1.0   Cardiac Enzymes: No results for input(s): CKTOTAL, CKMB, CKMBINDEX, TROPONINI in the last 168 hours. BNP (last 3 results) No results for input(s): PROBNP in the last 8760 hours. HbA1C: No results for input(s): HGBA1C in the last 72 hours. CBG: No results for input(s): GLUCAP in the last 168 hours. Lipid Profile: No results for input(s): CHOL, HDL, LDLCALC, TRIG, CHOLHDL, LDLDIRECT in the last 72 hours. Thyroid Function Tests: No results for  input(s): TSH, T4TOTAL, FREET4, T3FREE, THYROIDAB in the last 72 hours. Anemia Panel: Recent Labs    10/29/20 0349  VITAMINB12 3,093*  FOLATE 10.9  FERRITIN 206  TIBC 164*  IRON 25*   Urine analysis:    Component Value Date/Time   COLORURINE AMBER (A) 10/27/2020 0420   APPEARANCEUR HAZY (A) 10/27/2020 0420   LABSPEC 1.019 10/27/2020 0420   PHURINE 6.0 10/27/2020 0420   GLUCOSEU NEGATIVE 10/27/2020 0420   HGBUR NEGATIVE 10/27/2020 0420   BILIRUBINUR NEGATIVE 10/27/2020 0420   KETONESUR 5 (A) 10/27/2020 0420   PROTEINUR 30 (A) 10/27/2020 0420   NITRITE NEGATIVE 10/27/2020 0420   LEUKOCYTESUR NEGATIVE 10/27/2020 0420   Sepsis Labs: @LABRCNTIP (procalcitonin:4,lacticidven:4)  ) Recent Results (from the past 240 hour(s))  Resp Panel by RT-PCR (Flu A&B, Covid) Nasopharyngeal Swab     Status: None   Collection Time: 10/27/20 12:11 AM   Specimen: Nasopharyngeal Swab; Nasopharyngeal(NP) swabs in vial transport medium  Result Value Ref Range Status   SARS Coronavirus 2 by RT PCR NEGATIVE NEGATIVE Final    Comment: (NOTE) SARS-CoV-2 target nucleic acids are NOT DETECTED.  The SARS-CoV-2 RNA is generally detectable in upper respiratory specimens during the acute phase of infection. The lowest concentration of SARS-CoV-2 viral copies this assay can detect is 138 copies/mL. A negative result does not preclude SARS-Cov-2 infection and should not be used as the sole basis for treatment or other patient management decisions. A negative result may occur with  improper specimen collection/handling, submission of specimen other than nasopharyngeal swab, presence of viral mutation(s) within the areas targeted by this assay, and inadequate number of viral copies(<138 copies/mL). A negative result must be combined with clinical observations, patient history, and epidemiological information. The expected result is Negative.  Fact Sheet for Patients:   01/03/2021  Fact Sheet for Healthcare Providers:  BloggerCourse.com  This test is no t yet approved or cleared by the [employer] FDA and  has been authorized for detection and/or diagnosis of SARS-CoV-2 by FDA under an Emergency Use Authorization (EUA). This EUA will remain  in effect (meaning this test can be used) for the duration of the COVID-19 declaration under Section 564(b)(1) of the Act, 21 U.S.C.section 360bbb-3(b)(1), unless the authorization is terminated  or revoked sooner.       Influenza A by PCR NEGATIVE NEGATIVE Final   Influenza B by PCR NEGATIVE NEGATIVE Final    Comment: (NOTE) The Xpert Xpress SARS-CoV-2/FLU/RSV plus assay is intended as an aid in the diagnosis of influenza from Nasopharyngeal swab specimens and should not be used as a sole basis for treatment. Nasal washings and aspirates are unacceptable for Xpert Xpress SARS-CoV-2/FLU/RSV testing.  Fact Sheet for Patients: [place]  Fact Sheet for Healthcare Providers: BloggerCourse.com  This test is  not yet approved or cleared by the [place] and has been authorized for detection and/or diagnosis of SARS-CoV-2 by FDA under an Emergency Use Authorization (EUA). This EUA will remain in effect (meaning this test can be used) for the duration of the COVID-19 declaration under Section 564(b)(1) of the Act, 21 U.S.C. section 360bbb-3(b)(1), unless the authorization is terminated or revoked.  Performed at [facility], [address], [place], [place] [address]   Blood Culture (routine x 2)     Status: None (Preliminary result)   Collection Time: 10/27/20  3:15 AM   Specimen: BLOOD  Result Value Ref Range Status   Specimen Description BLOOD LEFT ARM  Final   Special Requests   Final    BOTTLES DRAWN AEROBIC AND ANAEROBIC Blood Culture results may not be optimal due to an  inadequate volume of blood received in culture bottles   Culture   Final    NO GROWTH 2 DAYS Performed at [facility], [address] [place], [place] [address]    Report Status PENDING  Incomplete  Urine culture     Status: Abnormal   Collection Time: 10/27/20  4:20 AM   Specimen: In/Out Cath Urine  Result Value Ref Range Status   Specimen Description IN/OUT CATH URINE  Final   Special Requests   Final    NONE Performed at [facility], [address], [place], [place] [address]    Culture 30,000 COLONIES/mL PSEUDOMONAS AERUGINOSA (A)  Final   Report Status 10/29/2020 FINAL  Final   Organism ID, Bacteria PSEUDOMONAS AERUGINOSA (A)  Final      Susceptibility   Pseudomonas aeruginosa - MIC*    CEFTAZIDIME 2 SENSITIVE Sensitive     CIPROFLOXACIN <=0.25 SENSITIVE Sensitive     GENTAMICIN <=1 SENSITIVE Sensitive     IMIPENEM 0.5 SENSITIVE Sensitive     PIP/TAZO <=4 SENSITIVE Sensitive     CEFEPIME 2 SENSITIVE Sensitive     * 30,000 COLONIES/mL PSEUDOMONAS AERUGINOSA  Blood Culture (routine x 2)     Status: None (Preliminary result)   Collection Time: 10/27/20  9:38 AM   Specimen: BLOOD  Result Value Ref Range Status   Specimen Description BLOOD RIGHT ANTECUBITAL  Final   Special Requests   Final    BOTTLES DRAWN AEROBIC AND ANAEROBIC Blood Culture adequate volume   Culture   Final    NO GROWTH 2 DAYS Performed at [facility], [address], [place], [place] [address]    Report Status PENDING  Incomplete      Studies: No results found.  Scheduled Meds: . acetaminophen  1,000 mg Oral Once  . baclofen  10 mg Oral QID  . cromolyn  100 mg Oral See admin instructions  . cromolyn  20 mg Nebulization TID  . enoxaparin (LOVENOX) injection  40 mg Subcutaneous Q24H  . famotidine  40 mg Oral BID  . fluticasone furoate-vilanterol  1 puff Inhalation Daily  . ipratropium-albuterol  3 mL Inhalation BID  . ivabradine  5 mg Oral BID  . loratadine  10 mg Oral  Daily  . montelukast  10 mg Oral QHS  . predniSONE  20 mg Oral Daily  . pyridostigmine  90 mg Oral Q3H  . sodium chloride flush  10-40 mL Intracatheter Q12H    Continuous Infusions: . ceFEPime (MAXIPIME) IV 2 g (10/29/20 1519)  . doxycycline (VIBRAMYCIN) IV 100 mg (10/29/20 0847)     LOS: 2  days     Briant Cedar, MD [facility]  If 7PM-7AM, please contact night-coverage [email or web address] 10/29/2020, 6:23 PM

## 2020-10-29 NOTE — Progress Notes (Signed)
Pt performed NIF: -15

## 2020-10-30 DIAGNOSIS — Z9189 Other specified personal risk factors, not elsewhere classified: Secondary | ICD-10-CM | POA: Diagnosis not present

## 2020-10-30 DIAGNOSIS — E871 Hypo-osmolality and hyponatremia: Secondary | ICD-10-CM | POA: Diagnosis not present

## 2020-10-30 DIAGNOSIS — Q796 Ehlers-Danlos syndrome, unspecified: Secondary | ICD-10-CM | POA: Diagnosis not present

## 2020-10-30 DIAGNOSIS — J189 Pneumonia, unspecified organism: Secondary | ICD-10-CM | POA: Diagnosis not present

## 2020-10-30 LAB — CBC WITH DIFFERENTIAL/PLATELET
Abs Immature Granulocytes: 0 10*3/uL (ref 0.00–0.07)
Basophils Absolute: 0 10*3/uL (ref 0.0–0.1)
Basophils Relative: 0 %
Eosinophils Absolute: 0 10*3/uL (ref 0.0–0.5)
Eosinophils Relative: 0 %
HCT: 26.9 % — ABNORMAL LOW (ref 36.0–46.0)
Hemoglobin: 8.4 g/dL — ABNORMAL LOW (ref 12.0–15.0)
Lymphocytes Relative: 1 %
Lymphs Abs: 0.1 10*3/uL — ABNORMAL LOW (ref 0.7–4.0)
MCH: 25.8 pg — ABNORMAL LOW (ref 26.0–34.0)
MCHC: 31.2 g/dL (ref 30.0–36.0)
MCV: 82.8 fL (ref 80.0–100.0)
Monocytes Absolute: 0.2 10*3/uL (ref 0.1–1.0)
Monocytes Relative: 2 %
Neutro Abs: 7.6 10*3/uL (ref 1.7–7.7)
Neutrophils Relative %: 97 %
Platelets: 284 10*3/uL (ref 150–400)
RBC: 3.25 MIL/uL — ABNORMAL LOW (ref 3.87–5.11)
RDW: 15 % (ref 11.5–15.5)
WBC: 7.8 10*3/uL (ref 4.0–10.5)
nRBC: 0 % (ref 0.0–0.2)
nRBC: 0 /100 WBC

## 2020-10-30 LAB — BASIC METABOLIC PANEL
Anion gap: 6 (ref 5–15)
BUN: 9 mg/dL (ref 6–20)
CO2: 25 mmol/L (ref 22–32)
Calcium: 7.5 mg/dL — ABNORMAL LOW (ref 8.9–10.3)
Chloride: 106 mmol/L (ref 98–111)
Creatinine, Ser: 0.55 mg/dL (ref 0.44–1.00)
GFR, Estimated: >60 mL/min (ref >60)
Glucose, Bld: 80 mg/dL (ref 70–99)
Potassium: 3.2 mmol/L — ABNORMAL LOW (ref 3.5–5.1)
Sodium: 137 mmol/L (ref 135–145)

## 2020-10-30 LAB — MAGNESIUM: Magnesium: 1.8 mg/dL (ref 1.7–2.4)

## 2020-10-30 MED ORDER — SODIUM CHLORIDE 0.9 % IV SOLN: INTRAVENOUS | Status: AC

## 2020-10-30 NOTE — Progress Notes (Addendum)
NIF: -16 VC:  MD notified. Pt not in any acute distress.

## 2020-10-30 NOTE — Progress Notes (Signed)
PROGRESS NOTE  Maxwell Martorano JWJ:[id number] DOB: 12-12-1990 DOA: 10/26/2020 PCP: Pcp, No  HPI/Recap of past 24 hours: HPI from Dr Leafy Half  30 year old female with past medical history of myasthenia gravis, Ehlers-Danlos syndrome, mast cell activation syndrome, POTS, protein calorie malnutrition status post PEG tube placement and dysautonomia who presents with complaints of cough, shortness of breath and fever. Patient is an extremely poor historian. Patient explains that for approximately the past 2 to 3 weeks she has been experiencing productive cough of tan-colored sputum, associated with worsening shortness of breath, pleuritic chest pain, subjective intermittent fevers with generalized weakness. Upon EMS arrival, patient was noted to be "hot to the touch."  EMS reports bodily fluids/excrement and poor living conditions with significant amounts of trash around the living space.  EMS also reports noting that a 69-year-old son is living in these conditions.  Neighbors present on scene reported trying to help take care of the child as well as clean up as much as possible. In the ED, patient has been found to have substantial leukocytosis of 14.9 with chest x-ray revealing developing bilateral basilar infiltrates concerning for pneumonia.  Patient was found to be febrile on arrival with temperature of 101.2 F.  Patient was initiated on intravenous fluids with normal saline and initiated on intravenous antibiotics with intravenous doxycycline and ceftriaxone.  Of note, patient has been hospitalized at [facility] twice since patient's arrival to the [place] area from [place] in late 2021.  During patient's initial presentation, the attending provider and neurology mentioned " that for any further hospitalizations the patient is better served at a tertiary care center with multiple specialties available to help care for this patient as her medical diagnoses are rather complex.  Patient is in  need of allergy, immunology, rheumatology all of which are not offered here [facility])."   Because of this, the ER provider did attempt to seek transfer to medical bed in [facility], [facility] and [facility], all of which declined transfer due to lack of medical beds.  The hospitalist group here at [facility] was then contacted to assess the patient for admission to the hospital.    Today, patient very tearful about wanting to be transferred to [facility] as other physicians are reported.  Patient denies any worsening cough, shortness of breath, abdominal pain, nausea/vomiting, fever/chills.  Discussed with Dr.Donohoe on 10/30/2020, who again stated that there are no beds available at [facility].     Assessment/Plan: Principal Problem:   SIRS (systemic inflammatory response syndrome) (HCC) Active Problems:   Severe protein-calorie malnutrition (HCC)   Myasthenia gravis (HCC)   Ehlers-Danlos syndrome   Mast cell activation syndrome (HCC)   POTS (postural orthostatic tachycardia syndrome)   Dysautonomia (HCC)   Pneumonia of both lower lobes due to infectious organism   Pain around PEG tube site   Hyponatremia   History of poor personal hygiene   Fever   CAP Bilateral lower lobe pneumonia Afebrile, with resolved leukocytosis BC x2 NGTD Sputum cultures pending Chest x-ray showed bilateral lower lung zone airspace opacity suggestive of infection Continue cefepime for broader coverage, continue doxycycline Continue supplemental O2 Continue IV fluids  Possible abdominal wall cellulitis surrounding PEG tube site Reports purulent drainage from PEG tube site, with some mild erythema, this is currently improving Patient also reports PEG tube has not been functioning-completely refuses for IR from our facility to take a look, wants it to be evaluated only at [facility] Patient able to eat by mouth, but uses PEG  tube for supplemental feeding with [place] splash Continue antibiotics as above  ?Possible UTI UA  unremarkable UC grew 30,000 Pseudomonas aeruginosa (immunosuppressed) Switched to cefepime  Hyponatremia Improving with IV fluids, continue Daily BMP  Anemia of chronic disease Baseline around 9-11 Anemia panel showed iron 25, sats 15, ferritin 206, folate 10.9, vitamin B12 greater than 3000 Daily CBC  Myasthenia gravis Continue home [place] PT/OT Consider neurology for possible myasthenia gravis flare if no improvement in the next couple of days  Mast cell activation syndrome Continue home prednisone, Dical, Pepcid At high risk for reactions, particularly anaphylactic reactions Close monitoring  Ehlers-Danlos syndrome Outpatient follow-up  Social issues/poor living condition EMS reports human fluids/excrement noted around the house Patient also lives with her 33-year-old son in this poor living condition Social worker/CPS consulted  Severe protein-calorie malnutrition Estimated body mass index is 18.54 kg/m as calculated from the following:   Height as of 07/09/20: 5\' 4"  (1.626 m).   Weight as of this encounter: 49 kg.  Dietitian consulted     Code Status: Full  Family Communication: None at bedside  Disposition Plan: Status is: Inpatient  Remains inpatient appropriate because:Inpatient level of care appropriate due to severity of illness   Dispo: The patient is from: Home              Anticipated d/c is to: Home              Patient currently is not medically stable to d/c.   Difficult to place patient No   Consultants:  None  Procedures:  None  Antimicrobials:  Cefepime  Doxycycline  DVT prophylaxis: Lovenox   Objective: Vitals:   10/29/20 2301 10/30/20 0606 10/30/20 0832 10/30/20 0928  BP: 130/85 140/87  124/74  Pulse: (!) 55 (!) 58  66  Resp:  18  20  Temp: 97.8 F (36.6 C)   98 F (36.7 C)  TempSrc: Oral   Oral  SpO2: 100% 100% 98% 96%  Weight:        Intake/Output Summary (Last 24 hours) at 10/30/2020 1433 Last data filed  at 10/30/2020 0601 Gross per 24 hour  Intake 1030.66 ml  Output 800 ml  Net 230.66 ml   Filed Weights   10/28/20 1400  Weight: 49 kg    Exam:  General: NAD, cachectic, frail-appearing, thin  Cardiovascular: S1, S2 present  Respiratory: CTAB  Abdomen: Soft, nontender, nondistended, bowel sounds present, G-tube noted with surrounding erythema  Musculoskeletal: No bilateral pedal edema noted  Skin: Normal  Psychiatry: Teary mood   Data Reviewed: CBC: Recent Labs  Lab 10/27/20 0011 10/28/20 0255 10/29/20 0349 10/30/20 0455  WBC 14.9* 12.6* 11.5* 7.8  NEUTROABS 13.6* 12.3* 9.6* 7.6  HGB 9.7* 10.1* 8.7* 8.4*  HCT 29.3* 31.3* 27.8* 26.9*  MCV 79.4* 81.7 83.5 82.8  PLT 275 261 269 284   Basic Metabolic Panel: Recent Labs  Lab 10/27/20 0011 10/28/20 0255 10/29/20 0349 10/30/20 0455  NA 127* 134* 135 137  K 3.1* 3.9 3.6 3.2*  CL 86* 99 101 106  CO2 26 25 24 25   GLUCOSE 87 105* 93 80  BUN 22* 12 15 9   CREATININE 1.03* 0.56 0.66 0.55  CALCIUM 7.2* 7.1* 7.5* 7.5*  MG 2.2  --   --  1.8   GFR: Estimated Creatinine Clearance: 80.3 mL/min (by C-G formula based on SCr of 0.55 mg/dL). Liver Function Tests: Recent Labs  Lab 10/27/20 0011 10/28/20 0255  AST 56* 33  ALT 15 14  ALKPHOS 45 37*  BILITOT 2.1* 2.4*  PROT 5.2* 4.6*  ALBUMIN 1.6* 1.3*   No results for input(s): LIPASE, AMYLASE in the last 168 hours. No results for input(s): AMMONIA in the last 168 hours. Coagulation Profile: Recent Labs  Lab 10/27/20 0011  INR 1.0   Cardiac Enzymes: No results for input(s): CKTOTAL, CKMB, CKMBINDEX, TROPONINI in the last 168 hours. BNP (last 3 results) No results for input(s): PROBNP in the last 8760 hours. HbA1C: No results for input(s): HGBA1C in the last 72 hours. CBG: No results for input(s): GLUCAP in the last 168 hours. Lipid Profile: No results for input(s): CHOL, HDL, LDLCALC, TRIG, CHOLHDL, LDLDIRECT in the last 72 hours. Thyroid Function  Tests: No results for input(s): TSH, T4TOTAL, FREET4, T3FREE, THYROIDAB in the last 72 hours. Anemia Panel: Recent Labs    10/29/20 0349  VITAMINB12 3,093*  FOLATE 10.9  FERRITIN 206  TIBC 164*  IRON 25*   Urine analysis:    Component Value Date/Time   COLORURINE AMBER (A) 10/27/2020 0420   APPEARANCEUR HAZY (A) 10/27/2020 0420   LABSPEC 1.019 10/27/2020 0420   PHURINE 6.0 10/27/2020 0420   GLUCOSEU NEGATIVE 10/27/2020 0420   HGBUR NEGATIVE 10/27/2020 0420   BILIRUBINUR NEGATIVE 10/27/2020 0420   KETONESUR 5 (A) 10/27/2020 0420   PROTEINUR 30 (A) 10/27/2020 0420   NITRITE NEGATIVE 10/27/2020 0420   LEUKOCYTESUR NEGATIVE 10/27/2020 0420   Sepsis Labs: @LABRCNTIP (procalcitonin:4,lacticidven:4)  ) Recent Results (from the past 240 hour(s))  Resp Panel by RT-PCR (Flu A&B, Covid) Nasopharyngeal Swab     Status: None   Collection Time: 10/27/20 12:11 AM   Specimen: Nasopharyngeal Swab; Nasopharyngeal(NP) swabs in vial transport medium  Result Value Ref Range Status   SARS Coronavirus 2 by RT PCR NEGATIVE NEGATIVE Final    Comment: (NOTE) SARS-CoV-2 target nucleic acids are NOT DETECTED.  The SARS-CoV-2 RNA is generally detectable in upper respiratory specimens during the acute phase of infection. The lowest concentration of SARS-CoV-2 viral copies this assay can detect is 138 copies/mL. A negative result does not preclude SARS-Cov-2 infection and should not be used as the sole basis for treatment or other patient management decisions. A negative result may occur with  improper specimen collection/handling, submission of specimen other than nasopharyngeal swab, presence of viral mutation(s) within the areas targeted by this assay, and inadequate number of viral copies(<138 copies/mL). A negative result must be combined with clinical observations, patient history, and epidemiological information. The expected result is Negative.  Fact Sheet for Patients:   BloggerCourse.comhttps://www.fda.gov/media/152166/download  Fact Sheet for Healthcare Providers:  SeriousBroker.ithttps://www.fda.gov/media/152162/download  This test is no t yet approved or cleared by the [place] FDA and  has been authorized for detection and/or diagnosis of SARS-CoV-2 by FDA under an Emergency Use Authorization (EUA). This EUA will remain  in effect (meaning this test can be used) for the duration of the COVID-19 declaration under Section 564(b)(1) of the Act, 21 U.S.C.section 360bbb-3(b)(1), unless the authorization is terminated  or revoked sooner.       Influenza A by PCR NEGATIVE NEGATIVE Final   Influenza B by PCR NEGATIVE NEGATIVE Final    Comment: (NOTE) The Xpert Xpress SARS-CoV-2/FLU/RSV plus assay is intended as an aid in the diagnosis of influenza from Nasopharyngeal swab specimens and should not be used as a sole basis for treatment. Nasal washings and aspirates are unacceptable for Xpert Xpress SARS-CoV-2/FLU/RSV testing.  Fact Sheet for Patients: BloggerCourse.comhttps://www.fda.gov/media/152166/download  Fact Sheet for Healthcare Providers: SeriousBroker.ithttps://www.fda.gov/media/152162/download  This test  is not yet approved or cleared by the [place] and has been authorized for detection and/or diagnosis of SARS-CoV-2 by FDA under an Emergency Use Authorization (EUA). This EUA will remain in effect (meaning this test can be used) for the duration of the COVID-19 declaration under Section 564(b)(1) of the Act, 21 U.S.C. section 360bbb-3(b)(1), unless the authorization is terminated or revoked.  Performed at [facility], [address], [place], [place] [address]   Blood Culture (routine x 2)     Status: None (Preliminary result)   Collection Time: 10/27/20  3:15 AM   Specimen: BLOOD  Result Value Ref Range Status   Specimen Description BLOOD LEFT ARM  Final   Special Requests   Final    BOTTLES DRAWN AEROBIC AND ANAEROBIC Blood Culture results may not be optimal due to an  inadequate volume of blood received in culture bottles   Culture   Final    NO GROWTH 2 DAYS Performed at [facility], [address] [place], [place] [address]    Report Status PENDING  Incomplete  Urine culture     Status: Abnormal   Collection Time: 10/27/20  4:20 AM   Specimen: In/Out Cath Urine  Result Value Ref Range Status   Specimen Description IN/OUT CATH URINE  Final   Special Requests   Final    NONE Performed at [facility], [address] [place], [place] [address]    Culture 30,000 COLONIES/mL PSEUDOMONAS AERUGINOSA (A)  Final   Report Status 10/29/2020 FINAL  Final   Organism ID, Bacteria PSEUDOMONAS AERUGINOSA (A)  Final      Susceptibility   Pseudomonas aeruginosa - MIC*    CEFTAZIDIME 2 SENSITIVE Sensitive     CIPROFLOXACIN <=0.25 SENSITIVE Sensitive     GENTAMICIN <=1 SENSITIVE Sensitive     IMIPENEM 0.5 SENSITIVE Sensitive     PIP/TAZO <=4 SENSITIVE Sensitive     CEFEPIME 2 SENSITIVE Sensitive     * 30,000 COLONIES/mL PSEUDOMONAS AERUGINOSA  Blood Culture (routine x 2)     Status: None (Preliminary result)   Collection Time: 10/27/20  9:38 AM   Specimen: BLOOD  Result Value Ref Range Status   Specimen Description BLOOD RIGHT ANTECUBITAL  Final   Special Requests   Final    BOTTLES DRAWN AEROBIC AND ANAEROBIC Blood Culture adequate volume   Culture   Final    NO GROWTH 2 DAYS Performed at [facility], [address] [place], [place] [address]    Report Status PENDING  Incomplete      Studies: No results found.  Scheduled Meds: . acetaminophen  1,000 mg Oral Once  . baclofen  10 mg Oral QID  . cromolyn  100 mg Oral See admin instructions  . cromolyn  20 mg Nebulization TID  . enoxaparin (LOVENOX) injection  40 mg Subcutaneous Q24H  . famotidine  40 mg Oral BID  . fluticasone furoate-vilanterol  1 puff Inhalation Daily  . ipratropium-albuterol  3 mL Inhalation BID  . ivabradine  5 mg Oral BID  . loratadine  10 mg Oral  Daily  . montelukast  10 mg Oral QHS  . predniSONE  20 mg Oral Daily  . pyridostigmine  90 mg Oral Q3H  . sodium chloride flush  10-40 mL Intracatheter Q12H    Continuous Infusions: . sodium chloride 75 mL/hr at 10/29/20 2018  . ceFEPime (MAXIPIME) IV 2 g (10/30/20 1339)  . doxycycline (VIBRAMYCIN) IV  100 mg (10/30/20 0925)     LOS: 3 days     Briant Cedar, MD [facility]  If 7PM-7AM, please contact night-coverage [email or web address] 10/30/2020, 2:33 PM

## 2020-10-30 NOTE — Evaluation (Signed)
Occupational Therapy Evaluation Patient Details Name: Brandy [name] MRN: [id number] DOB: [birth year] Today's Date: 10/30/2020    History of Present Illness 30 year old female with past medical history of myasthenia gravis, Ehlers-Danlos syndrome, mast cell activation syndrome, POTS, protein calorie malnutrition status post PEG tube placement and dysautonomia who presents to [facility] emergency department with complaints of cough shortness of breath and fever. Found to have Pneumonia of the bilateral lower lobes due to infectious organism and SIRS   Clinical Impression   This 29 yo female admitted with above presents to acute OT with PLOF per her report of sometimes able to do ADLs by herself and other times needs A (when she is not feeling well), reports she can transfer to/from power W/C on her own. Currently she was able to clean her front peri area in bed once washcloths were presented to her, roll in bed left and right, and scoot herself up in the bed--all to get cleaned up from bowel and bladder leakage in bed. She would benefit from SNF for rehab to get her stronger and more independent before returning home to 42 yo son; however pt does not want to do this since her 66 yo son is at home (currently with friend per pt). She will continue to benefit from acute OT with follow up at SNF being first recommendation, but if she refuses then recommend to max out HH serivces (OT/PT, Aide, SW). We will continue to follow.    Follow Up Recommendations  SNF;Supervision/Assistance - 24 hour    Equipment Recommendations  None recommended by OT       Precautions / Restrictions Precautions Precautions: Fall Restrictions Weight Bearing Restrictions: No      Mobility Bed Mobility Overal bed mobility: Modified Independent             General bed mobility comments: use of bed rail for rolling L and R for placement of clean linens, able to scoot her self up in bed to take medication  however, when asked to pull herself up in bed latera, reports she needs help, declines further mobility         Balance Overall balance assessment:  (unable to assess)                                         ADL either performed or assessed with clinical judgement   ADL Overall ADL's : Needs assistance/impaired Eating/Feeding: Independent;Bed level   Grooming: Set up;Bed level   Upper Body Bathing: Set up;Bed level   Lower Body Bathing: Minimal assistance;Bed level   Upper Body Dressing : Set up;Bed level   Lower Body Dressing: Moderate assistance;Bed level                       Vision Patient Visual Report: No change from baseline              Pertinent Vitals/Pain Pain Assessment: Faces Faces Pain Scale: Hurts little more Pain Location: generalized Pain Descriptors / Indicators: Grimacing Pain Intervention(s): Limited activity within patient's tolerance;Monitored during session;Repositioned;RN gave pain meds during session;Patient requesting pain meds-RN notified     Hand Dominance Right   Extremity/Trunk Assessment Upper Extremity Assessment Upper Extremity Assessment: Generalized weakness (grossly 3/5)   Lower Extremity Assessment Lower Extremity Assessment: Generalized weakness;RLE deficits/detail;LLE deficits/detail RLE Deficits / Details: assessed in movement, ROM WFL strength grossly 3+/5  LLE Deficits / Details: assessed in movement, ROM WFL strength grossly 3+/5       Communication Communication Communication: No difficulties   Cognition Arousal/Alertness: Awake/alert Behavior During Therapy: Anxious (Needs to be in charge of situation) Overall Cognitive Status: Impaired/Different from baseline Area of Impairment: Awareness;Problem solving;Safety/judgement                         Safety/Judgement: Decreased awareness of deficits (son in home)   Problem Solving: Decreased initiation General Comments: Pt with  strong need to control her environment   General Comments  Pt on 2L O2 via [place]. VSS            Home Living Family/patient expects to be discharged to:: Private residence Living Arrangements: Children   Type of Home: House Home Access: Ramped entrance     Home Layout: One level     Bathroom Shower/Tub: Chief Strategy Officer: Standard     Home Equipment: Wheelchair - power;Other (comment);Adaptive equipment (slideboard for transport) Adaptive Equipment: Reacher;Sock aid;Long-handled shoe horn;Long-handled sponge        Prior Functioning/Environment Level of Independence: Needs assistance  Gait / Transfers Assistance Needed: when feeling good can transfer to powerchair ADL's / Homemaking Assistance Needed: reports having HHAide for short duration however does not have one currently, performs bADLs when she is "feeling good", relies on friends for iADLs            OT Problem List: Impaired balance (sitting and/or standing);Decreased cognition;Pain      OT Treatment/Interventions: Self-care/ADL training;DME and/or AE instruction;Patient/family education;Balance training    OT Goals(Current goals can be found in the care plan section) Acute Rehab OT Goals Patient Stated Goal: go home with HHPT/HHOT and HHAide OT Goal Formulation: With patient Time For Goal Achievement: 11/13/20 Potential to Achieve Goals: Fair  OT Frequency: Min 2X/week   Barriers to D/C: Decreased caregiver support             AM-PAC OT "6 Clicks" Daily Activity     Outcome Measure Help from another person eating meals?: None Help from another person taking care of personal grooming?: A Little Help from another person toileting, which includes using toliet, bedpan, or urinal?: A Lot Help from another person bathing (including washing, rinsing, drying)?: A Lot Help from another person to put on and taking off regular upper body clothing?: A Little Help from another person to put on  and taking off regular lower body clothing?: A Lot 6 Click Score: 12   End of Session    Activity Tolerance: Patient tolerated treatment well Patient left: in bed;with call bell/phone within reach;with bed alarm set  OT Visit Diagnosis: Other abnormalities of gait and mobility (R26.89);Muscle weakness (generalized) (M62.81);Pain Pain - part of body:  (generalized)                Time: 9191-6606 OT Time Calculation (min): 31 min Charges:  OT General Charges $OT Visit: 1 Visit OT Evaluation $OT Eval Moderate Complexity: 1 Mod  .Ignacia Palma, OTR/L Acute Rehab Services Pager [phone] Office [phone]     Evette Georges 10/30/2020, 5:30 PM

## 2020-10-30 NOTE — Care Management Important Message (Signed)
Important Message  Patient Details  Name: Brandy [name] MRN: [id number] Date of Birth: [birth year]   Medicare Important Message Given:  Yes - Important Message mailed due to current National Emergency   Verbal consent obtained due to current National Emergency  Relationship to patient: Self Contact Name: Texie Call Date: 10/30/20  Time: 0920 Phone: [phone] Outcome: No Answer/Busy Important Message mailed to: Patient address on file    Orson Aloe 10/30/2020, 9:21 AM

## 2020-10-30 NOTE — Progress Notes (Signed)
NIF and VC were unable to be completed. Patient was on the phone and wanted to continue with her phone.  RT also unable to obtain vitals.

## 2020-10-30 NOTE — Evaluation (Signed)
Physical Therapy Evaluation Patient Details Name: Brandy [name] MRN: [id number] DOB: [birth year] Today's Date: 10/30/2020   History of Present Illness  30 year old female with past medical history of myasthenia gravis, Ehlers-Danlos syndrome, mast cell activation syndrome, POTS, protein calorie malnutrition status post PEG tube placement and dysautonomia who presents to [facility] emergency department with complaints of cough shortness of breath and fever. Found to have Pneumonia of the bilateral lower lobes due to infectious organism and SIRS  Clinical Impression  Pt bed soaked in urine on entry, requesting assistance for cleaning up. Pt reports she also needs her medications. RN notified. Pt provided with wet wash cloths and able to perform her own pericare. Able to roll with use of bed rail for placement of clean linens. Pt requesting new cervical collar as her is broken and she reports needing it for out of bed mobility. Pt refuses further mobility and requires max Ax2 for scooting up in bed despite being able to perform earlier in session. Pt reports that when she is feeling good she is able to transfer to her power chair to use bathroom and bathe. However when she is not feeling well she is confined to her bed. Pt reports she had HHAide/HHPT and HHOT but they "stopped coming".  Pt with poor insight into poor living conditions created given her sporadic mobilization when she is feeling good. Pt reports needing to be home to take care of her 59 yo son as to why she can not go to SNF level rehab to improve mobility. Pt reports son is with friends now. PT continues to recommend SNF level rehab for maximal safe mobility.     Follow Up Recommendations SNF;Supervision - Intermittent    Equipment Recommendations  Other (comment) (would like tub lift)    Recommendations for Other Services OT consult     Precautions / Restrictions Restrictions Weight Bearing Restrictions: No       Mobility  Bed Mobility Overal bed mobility: Modified Independent             General bed mobility comments: use of bed rail for rolling L and R for placement of clean linens, able to scoot her self up in bed to take medication however, when asked to pull herself up in bed latera, reports she needs help, declines further mobility           Balance Overall balance assessment:  (unable to assess)                                           Pertinent Vitals/Pain Pain Assessment: Faces Faces Pain Scale: Hurts little more Pain Location: generalized Pain Descriptors / Indicators: Grimacing Pain Intervention(s): Limited activity within patient's tolerance;Monitored during session;Repositioned;Patient requesting pain meds-RN notified;RN gave pain meds during session    Home Living Family/patient expects to be discharged to:: Private residence Living Arrangements: Children   Type of Home: House Home Access: Ramped entrance     Home Layout: One level Home Equipment: Wheelchair - power;Other (comment);Adaptive equipment (slideboard for transport)      Prior Function Level of Independence: Needs assistance   Gait / Transfers Assistance Needed: when feeling good can transfer to powerchair  ADL's / Homemaking Assistance Needed: reports having HHAide for short duration however does not have one currently, performs bADLs when she is "feeling good", relies on friends for iADLs  Hand Dominance   Dominant Hand: Right    Extremity/Trunk Assessment   Upper Extremity Assessment Upper Extremity Assessment: Defer to OT evaluation    Lower Extremity Assessment Lower Extremity Assessment: Generalized weakness;RLE deficits/detail;LLE deficits/detail RLE Deficits / Details: assessed in movement, ROM WFL strength grossly 3+/5 LLE Deficits / Details: assessed in movement, ROM WFL strength grossly 3+/5       Communication   Communication: No difficulties   Cognition Arousal/Alertness: Awake/alert Behavior During Therapy: Anxious (Needs to be in charge of situation) Overall Cognitive Status: Impaired/Different from baseline Area of Impairment: Awareness;Problem solving;Safety/judgement                         Safety/Judgement: Decreased awareness of deficits (son in home)   Problem Solving: Decreased initiation General Comments: Pt with strong need to control her environment      General Comments General comments (skin integrity, edema, etc.): Pt on 2L O2 via [place]. VSS        Assessment/Plan    PT Assessment Patient needs continued PT services  PT Problem List Decreased strength;Decreased activity tolerance;Decreased balance;Decreased mobility;Decreased cognition;Decreased safety awareness;Cardiopulmonary status limiting activity;Pain       PT Treatment Interventions DME instruction;Functional mobility training;Therapeutic activities;Therapeutic exercise;Balance training;Cognitive remediation;Neuromuscular re-education;Patient/family education    PT Goals (Current goals can be found in the Care Plan section)  Acute Rehab PT Goals Patient Stated Goal: go home with HHPT/HHPT and HHAide PT Goal Formulation: With patient Time For Goal Achievement: 11/13/20 Potential to Achieve Goals: Fair    Frequency Min 3X/week   Barriers to discharge Decreased caregiver support pt is caregiver to 72 yo son, unable to fully care for herself, receives a little support from friends       AM-PAC PT "6 Clicks" Mobility  Outcome Measure Help needed turning from your back to your side while in a flat bed without using bedrails?: None Help needed moving from lying on your back to sitting on the side of a flat bed without using bedrails?: None Help needed moving to and from a bed to a chair (including a wheelchair)?: A Lot Help needed standing up from a chair using your arms (e.g., wheelchair or bedside chair)?: Total Help needed to walk in  hospital room?: Total Help needed climbing 3-5 steps with a railing? : Total 6 Click Score: 13    End of Session Equipment Utilized During Treatment: Oxygen Activity Tolerance: Patient limited by pain Patient left: in bed;with call bell/phone within reach;with bed alarm set Nurse Communication: Mobility status PT Visit Diagnosis: Muscle weakness (generalized) (M62.81);Adult, failure to thrive (R62.7);Difficulty in walking, not elsewhere classified (R26.2);Other symptoms and signs involving the nervous system (R29.898);Pain Pain - part of body:  (back)    Time: 1500-1535 PT Time Calculation (min) (ACUTE ONLY): 35 min   Charges:   PT Evaluation $PT Eval Moderate Complexity: 1 Mod          Ollie Esty B. Beverely Risen PT, DPT Acute Rehabilitation Services Pager [phone] Office [phone]   Elon Alas Fleet 10/30/2020, 4:44 PM

## 2020-10-31 DIAGNOSIS — Z9189 Other specified personal risk factors, not elsewhere classified: Secondary | ICD-10-CM | POA: Diagnosis not present

## 2020-10-31 DIAGNOSIS — Q796 Ehlers-Danlos syndrome, unspecified: Secondary | ICD-10-CM | POA: Diagnosis not present

## 2020-10-31 DIAGNOSIS — J189 Pneumonia, unspecified organism: Secondary | ICD-10-CM | POA: Diagnosis not present

## 2020-10-31 DIAGNOSIS — L8990 Pressure ulcer of unspecified site, unspecified stage: Secondary | ICD-10-CM | POA: Insufficient documentation

## 2020-10-31 DIAGNOSIS — E871 Hypo-osmolality and hyponatremia: Secondary | ICD-10-CM | POA: Diagnosis not present

## 2020-10-31 LAB — CBC WITH DIFFERENTIAL/PLATELET
Abs Immature Granulocytes: 0.2 10*3/uL — ABNORMAL HIGH (ref 0.00–0.07)
Basophils Absolute: 0 10*3/uL (ref 0.0–0.1)
Basophils Relative: 0 %
Eosinophils Absolute: 0 10*3/uL (ref 0.0–0.5)
Eosinophils Relative: 0 %
HCT: 27.4 % — ABNORMAL LOW (ref 36.0–46.0)
Hemoglobin: 8.7 g/dL — ABNORMAL LOW (ref 12.0–15.0)
Lymphocytes Relative: 3 %
Lymphs Abs: 0.3 10*3/uL — ABNORMAL LOW (ref 0.7–4.0)
MCH: 26.2 pg (ref 26.0–34.0)
MCHC: 31.8 g/dL (ref 30.0–36.0)
MCV: 82.5 fL (ref 80.0–100.0)
Metamyelocytes Relative: 2 %
Monocytes Absolute: 0 10*3/uL — ABNORMAL LOW (ref 0.1–1.0)
Monocytes Relative: 0 %
Neutro Abs: 8.4 10*3/uL — ABNORMAL HIGH (ref 1.7–7.7)
Neutrophils Relative %: 95 %
Platelets: 322 10*3/uL (ref 150–400)
RBC: 3.32 MIL/uL — ABNORMAL LOW (ref 3.87–5.11)
RDW: 15.1 % (ref 11.5–15.5)
WBC: 8.8 10*3/uL (ref 4.0–10.5)
nRBC: 0 % (ref 0.0–0.2)
nRBC: 1 /100 WBC — ABNORMAL HIGH

## 2020-10-31 LAB — MAGNESIUM: Magnesium: 1.8 mg/dL (ref 1.7–2.4)

## 2020-10-31 LAB — BASIC METABOLIC PANEL
Anion gap: 10 (ref 5–15)
BUN: 6 mg/dL (ref 6–20)
CO2: 25 mmol/L (ref 22–32)
Calcium: 7.6 mg/dL — ABNORMAL LOW (ref 8.9–10.3)
Chloride: 101 mmol/L (ref 98–111)
Creatinine, Ser: 0.49 mg/dL (ref 0.44–1.00)
GFR, Estimated: >60 mL/min (ref >60)
Glucose, Bld: 100 mg/dL — ABNORMAL HIGH (ref 70–99)
Potassium: 2.8 mmol/L — ABNORMAL LOW (ref 3.5–5.1)
Sodium: 136 mmol/L (ref 135–145)

## 2020-10-31 LAB — PHOSPHORUS: Phosphorus: 1.2 mg/dL — ABNORMAL LOW (ref 2.5–4.6)

## 2020-10-31 MED ORDER — ONDANSETRON HCL 4 MG PO TABS
4.0000 mg | ORAL_TABLET | Freq: Three times a day (TID) | ORAL | Status: DC | PRN
  Filled 2020-10-31: qty 1

## 2020-10-31 MED ORDER — POTASSIUM PHOSPHATES 15 MMOLE/5ML IV SOLN
30.0000 mmol | Freq: Once | INTRAVENOUS | Status: AC
  Administered 2020-10-31: 30 mmol via INTRAVENOUS
  Filled 2020-10-31: qty 10

## 2020-10-31 MED ORDER — MAGNESIUM SULFATE 2 GM/50ML IV SOLN
2.0000 g | Freq: Once | INTRAVENOUS | Status: AC
  Administered 2020-10-31: 2 g via INTRAVENOUS
  Filled 2020-10-31: qty 50

## 2020-10-31 MED ORDER — DOXYCYCLINE HYCLATE 100 MG PO TABS
100.0000 mg | ORAL_TABLET | Freq: Two times a day (BID) | ORAL | Status: DC
  Filled 2020-10-31 (×2): qty 1

## 2020-10-31 MED ORDER — ONDANSETRON 4 MG PO TBDP
4.0000 mg | ORAL_TABLET | Freq: Three times a day (TID) | ORAL | Status: DC | PRN
  Administered 2020-10-31 – 2020-11-01 (×2): 4 mg via ORAL
  Filled 2020-10-31 (×3): qty 1

## 2020-10-31 MED ORDER — IVABRADINE HCL 5 MG PO TABS
5.0000 mg | ORAL_TABLET | Freq: Two times a day (BID) | ORAL | Status: DC
  Administered 2020-10-31 – 2020-11-06 (×12): 5 mg via ORAL
  Filled 2020-10-31 (×12): qty 1

## 2020-10-31 MED ORDER — LEVOCETIRIZINE DIHYDROCHLORIDE 5 MG PO TABS
5.0000 mg | ORAL_TABLET | Freq: Every evening | ORAL | Status: DC
  Administered 2020-10-31: 5 mg via ORAL
  Filled 2020-10-31: qty 1

## 2020-10-31 MED ORDER — POTASSIUM CHLORIDE 10 MEQ/100ML IV SOLN
10.0000 meq | INTRAVENOUS | Status: AC
  Administered 2020-10-31 (×2): 10 meq via INTRAVENOUS
  Filled 2020-10-31 (×2): qty 100

## 2020-10-31 NOTE — Progress Notes (Signed)
At approximately 2230, this RN and NT cleaned the patient.  She was wet.  She had thrown her blankets onto the floor.  The [place] was hanging over the side rail.  Patient is delayed in her responses at times.  She wanted [place] to clean her but could use only water.  At the beginning, she wanted [place] to put the [facility] on her but at the end, she wanted the [place] to sit beside her and she would put it on when she needed it.  Also, she insisted on cleaning her perineum area.  She scrubbed up and down and not from perineum to rectum.  Attempted to educate her but she continued to clean herself the same way.  She then proceeded to attempt to use the same washcloth to clean her Peg Tube.  I grabbed the dirty washcloth and attempted to educate on the rationale but she started on another topic.  Will continue to monitor patient.  Bernie Covey RN

## 2020-10-31 NOTE — Progress Notes (Signed)
Pt took scheduled breathing treatments and inhaler. Upon RT request to do NIF/VC pt only will do NIF at this time. NIF -20 achieved. Pt states she does not like the filter box on VC. Pt educated that the filter is for her and others protection. Pt refused to do flutter valve at this time also. RT will continue to monitor and be available as needed.

## 2020-10-31 NOTE — Progress Notes (Signed)
NIF -20. Pt did not do VC. Pt states she has been using flutter on her own.

## 2020-10-31 NOTE — Progress Notes (Signed)
Patient refused Telemetry monitoring per night nurse and to myself, stated allergic to adhesive.  Telemetry called regarding orders.

## 2020-10-31 NOTE — Consult Note (Signed)
IR requested to evaluate patient's GJ tube per patient request. Patient was assessed at the bedside - the g-port flushed and aspirated easily. The j-port flushed easily but does not aspirate. No leakage observed around skin insertion site. I educated the patient that both ports are working as expected.  The patient then stated what she really wants is a different tube which is one she found online: Covidien Kangaroo Skin Level Balloon Gastrostomy kit 27F x 4 CM. She stated she wants a "low profile tube" that is less bulky and easier for her to use independently.   The patient has been hospitalized for the past five days and has taken nutrition and medications PO per the bedside nurse. I asked the patient if she still needed the tube and she stated she did. She states she needs the g-port to vent (states she has a history of gastroparesis) and the j-port for nutrition when she can't take PO. I informed the patient the tube she is requesting (the covidien kangaroo skin level balloon gastrostomy kit) is only a g-tube and would not meet her needs if she needs both the G and the J tubes.   IR does not currently have any "low profile" GJ tubes. After discussion with Dr. Horris Latino, the plan will be for the patient to follow up with her team at [facility] to discuss alternative tube options.   This information was relayed to the patient via telephone and she verbalized understanding. No plans for IR intervention and the order will be deleted.  Please call IR with any questions.  Soyla Dryer, [place] [phone] 10/31/2020, 1:20 PM

## 2020-10-31 NOTE — Progress Notes (Signed)
PROGRESS NOTE  Brandy [name] YPP:[id number] DOB: 1990/09/21 DOA: 10/26/2020 PCP: Pcp, No  HPI/Recap of past 24 hours: HPI from Dr Leafy Half  30 year old female with past medical history of myasthenia gravis, Ehlers-Danlos syndrome, mast cell activation syndrome, POTS, protein calorie malnutrition status post PEG tube placement and dysautonomia who presents with complaints of cough, shortness of breath and fever. Patient is an extremely poor historian. Patient explains that for approximately the past 2 to 3 weeks she has been experiencing productive cough of tan-colored sputum, associated with worsening shortness of breath, pleuritic chest pain, subjective intermittent fevers with generalized weakness. Upon EMS arrival, patient was noted to be "hot to the touch."  EMS reports bodily fluids/excrement and poor living conditions with significant amounts of trash around the living space.  EMS also reports noting that a 73-year-old son is living in these conditions.  Neighbors present on scene reported trying to help take care of the child as well as clean up as much as possible. In the ED, patient has been found to have substantial leukocytosis of 14.9 with chest x-ray revealing developing bilateral basilar infiltrates concerning for pneumonia.  Patient was found to be febrile on arrival with temperature of 101.2 F.  Patient was initiated on intravenous fluids with normal saline and initiated on intravenous antibiotics with intravenous doxycycline and ceftriaxone.  Of note, patient has been hospitalized at [facility] twice since patient's arrival to the [place] area from [place] in late 2021.  During patient's initial presentation, the attending provider and neurology mentioned " that for any further hospitalizations the patient is better served at a tertiary care center with multiple specialties available to help care for this patient as her medical diagnoses are rather complex.  Patient is in  need of allergy, immunology, rheumatology all of which are not offered here [facility])."   Because of this, the ER provider did attempt to seek transfer to medical bed in [facility], [facility] and [facility], all of which declined transfer due to lack of medical beds.  The hospitalist group here at [facility] was then contacted to assess the patient for admission to the hospital.     Today, patient still wanting to be transferred to [facility], discussed with her that we will probably not happen over the weekend as there are no beds.  Reports her cough is improving, denies any worsening shortness of breath, abdominal pain, nausea/vomiting, fever/chills.      Assessment/Plan: Principal Problem:   SIRS (systemic inflammatory response syndrome) (HCC) Active Problems:   Severe protein-calorie malnutrition (HCC)   Myasthenia gravis (HCC)   Ehlers-Danlos syndrome   Mast cell activation syndrome (HCC)   POTS (postural orthostatic tachycardia syndrome)   Dysautonomia (HCC)   Pneumonia of both lower lobes due to infectious organism   Pain around PEG tube site   Hyponatremia   History of poor personal hygiene   Fever   Pressure injury of skin   CAP Bilateral lower lobe pneumonia Afebrile, with resolved leukocytosis BC x2 NGTD Sputum cultures pending Chest x-ray showed bilateral lower lung zone airspace opacity suggestive of infection Continue cefepime for broader coverage, continue doxycycline Continue supplemental O2  Possible abdominal wall cellulitis surrounding PEG tube site Reports purulent drainage from PEG tube site, with some mild erythema, this is currently improving Patient also reports PEG tube has not been functioning, IR evaluated, stated PEG tube is functioning and patient just wants it changed to a specific PEG tube that is not available here at [facility]. Plan  for patient to follow up with [facility] as an outpatient Patient able to eat by mouth, but uses PEG tube for supplemental feeding with [place]  splash Continue antibiotics as above  ?Possible UTI UA unremarkable UC grew 30,000 Pseudomonas aeruginosa (immunosuppressed) Switched to cefepime  Hyponatremia Improved Daily BMP  Hypokalemia/hypophosphatemia Replace prn  Anemia of chronic disease Baseline around 9-11 Anemia panel showed iron 25, sats 15, ferritin 206, folate 10.9, vitamin B12 greater than 3000 Daily CBC  Myasthenia gravis Continue home [place]on PT/OT Consider neurology for possible myasthenia gravis flare if no improvement in the next couple of days  Mast cell activation syndrome Continue home prednisone, Dical, Pepcid At high risk for reactions, particularly anaphylactic reactions Close monitoring  Ehlers-Danlos syndrome Outpatient follow-up  Social issues/poor living condition EMS reports human fluids/excrement noted around the house Patient also lives with her 30-year-old son in this poor living condition Social worker/CPS consulted  Severe protein-calorie malnutrition Estimated body mass index is 18.54 kg/m as calculated from the following:   Height as of 07/09/20:  (1.626 m).   Weight as of this encounter: 49 kg.  Dietitian consulted     Code Status: Full  Family Communication: None at bedside  Disposition Plan: Status is: Inpatient  Remains inpatient appropriate because:Inpatient level of care appropriate due to severity of illness   Dispo: The patient is from: Home              Anticipated d/c is to: Home              Patient currently is not medically stable to d/c.   Difficult to place patient No   Consultants:  None  Procedures:  None  Antimicrobials:  Cefepime  Doxycycline  DVT prophylaxis: Lovenox   Objective: Vitals:   10/30/20 0928 10/30/20 2025 10/31/20 0625 10/31/20 0939  BP: 124/74 128/77 120/81 126/81  Pulse: 66 70 60 74  Resp: Temp: 98 F (36.7 C) (!) 97.5 F (36.4 C) 97.6 F (36.4 C) 98 F (36.7 C)  TempSrc: Oral Oral Oral  Oral  SpO2: 96% 99% 100% 100%  Weight:        Intake/Output Summary (Last 24 hours) at 10/31/2020 1546 Last data filed at 10/30/2020 1700 Gross per 24 hour  Intake 1236.62 ml  Output 1200 ml  Net 36.62 ml   Filed Weights   10/28/20 1400  Weight: 49 kg    Exam:  General: NAD, cachectic, frail-appearing, thin  Cardiovascular: S1, S2 present  Respiratory: CTAB  Abdomen: Soft, nontender, nondistended, bowel sounds present, G-tube noted with surrounding erythema  Musculoskeletal: No bilateral pedal edema noted  Skin: Normal  Psychiatry: Teary mood   Data Reviewed: CBC: Recent Labs  Lab 10/27/20 0011 10/28/20 0255 10/29/20 0349 10/30/20 0455 10/31/20 0428  WBC 14.9* 12.6* 11.5* 7.8 8.8  NEUTROABS 13.6* 12.3* 9.6* 7.6 8.4*  HGB 9.7* 10.1* 8.7* 8.4* 8.7*  HCT 29.3* 31.3* 27.8* 26.9* 27.4*  MCV 79.4* 81.7 83.5 82.8 82.5  PLT 275 261 269 284 322   Basic Metabolic Panel: Recent Labs  Lab 10/27/20 0011 10/28/20 0255 10/29/20 0349 10/30/20 0455 10/31/20 0423 10/31/20 0428  NA 127* 134* 135 137  --  136  K 3.1* 3.9 3.6 3.2*  --  2.8*  CL 86* 99 101 106  --  101  CO2 --  25  GLUCOSE 87 105* 93 80  --  100*  BUN 22* --  6  CREATININE 1.03* 0.56 0.66 0.55  --  0.49  CALCIUM 7.2* 7.1* 7.5* 7.5*  --  7.6*  MG 2.2  --   --  1.8 1.8  --   PHOS  --   --   --   --  1.2*  --    GFR: Estimated Creatinine Clearance: 80.3 mL/min (by C-G formula based on SCr of 0.49 mg/dL). Liver Function Tests: Recent Labs  Lab 10/27/20 0011 10/28/20 0255  AST 56* 33  ALT 15 14  ALKPHOS 45 37*  BILITOT 2.1* 2.4*  PROT 5.2* 4.6*  ALBUMIN 1.6* 1.3*   No results for input(s): LIPASE, AMYLASE in the last 168 hours. No results for input(s): AMMONIA in the last 168 hours. Coagulation Profile: Recent Labs  Lab 10/27/20 0011  INR 1.0   Cardiac Enzymes: No results for input(s): CKTOTAL, CKMB, CKMBINDEX, TROPONINI in the last 168 hours. BNP (last 3  results) No results for input(s): PROBNP in the last 8760 hours. HbA1C: No results for input(s): HGBA1C in the last 72 hours. CBG: No results for input(s): GLUCAP in the last 168 hours. Lipid Profile: No results for input(s): CHOL, HDL, LDLCALC, TRIG, CHOLHDL, LDLDIRECT in the last 72 hours. Thyroid Function Tests: No results for input(s): TSH, T4TOTAL, FREET4, T3FREE, THYROIDAB in the last 72 hours. Anemia Panel: Recent Labs    10/29/20 0349  VITAMINB12 3,093*  FOLATE 10.9  FERRITIN 206  TIBC 164*  IRON 25*   Urine analysis:    Component Value Date/Time   COLORURINE AMBER (A) 10/27/2020 0420   APPEARANCEUR HAZY (A) 10/27/2020 0420   LABSPEC 1.019 10/27/2020 0420   PHURINE 6.0 10/27/2020 0420   GLUCOSEU NEGATIVE 10/27/2020 0420   HGBUR NEGATIVE 10/27/2020 0420   BILIRUBINUR NEGATIVE 10/27/2020 0420   KETONESUR 5 (A) 10/27/2020 0420   PROTEINUR 30 (A) 10/27/2020 0420   NITRITE NEGATIVE 10/27/2020 0420   LEUKOCYTESUR NEGATIVE 10/27/2020 0420   Sepsis Labs: @LABRCNTIP (procalcitonin:4,lacticidven:4)  ) Recent Results (from the past 240 hour(s))  Resp Panel by RT-PCR (Flu A&B, Covid) Nasopharyngeal Swab     Status: None   Collection Time: 10/27/20 12:11 AM   Specimen: Nasopharyngeal Swab; Nasopharyngeal(NP) swabs in vial transport medium  Result Value Ref Range Status   SARS Coronavirus 2 by RT PCR NEGATIVE NEGATIVE Final    Comment: (NOTE) SARS-CoV-2 target nucleic acids are NOT DETECTED.  The SARS-CoV-2 RNA is generally detectable in upper respiratory specimens during the acute phase of infection. The lowest concentration of SARS-CoV-2 viral copies this assay can detect is 138 copies/mL. A negative result does not preclude SARS-Cov-2 infection and should not be used as the sole basis for treatment or other patient management decisions. A negative result may occur with  improper specimen collection/handling, submission of specimen other than nasopharyngeal swab,  presence of viral mutation(s) within the areas targeted by this assay, and inadequate number of viral copies(<138 copies/mL). A negative result must be combined with clinical observations, patient history, and epidemiological information. The expected result is Negative.  Fact Sheet for Patients:  12/20/2020  Fact Sheet for Healthcare Providers:  BloggerCourse.com  This test is no t yet approved or cleared by the [employer] FDA and  has been authorized for detection and/or diagnosis of SARS-CoV-2 by FDA under an Emergency Use Authorization (EUA). This EUA will remain  in effect (meaning this test can be used) for the duration of the COVID-19 declaration under Section 564(b)(1) of the Act, 21 U.S.C.section 360bbb-3(b)(1), unless the authorization is  terminated  or revoked sooner.       Influenza A by PCR NEGATIVE NEGATIVE Final   Influenza B by PCR NEGATIVE NEGATIVE Final    Comment: (NOTE) The Xpert Xpress SARS-CoV-2/FLU/RSV plus assay is intended as an aid in the diagnosis of influenza from Nasopharyngeal swab specimens and should not be used as a sole basis for treatment. Nasal washings and aspirates are unacceptable for Xpert Xpress SARS-CoV-2/FLU/RSV testing.  Fact Sheet for Patients: BloggerCourse.com  Fact Sheet for Healthcare Providers: SeriousBroker.it  This test is not yet approved or cleared by the [place] FDA and has been authorized for detection and/or diagnosis of SARS-CoV-2 by FDA under an Emergency Use Authorization (EUA). This EUA will remain in effect (meaning this test can be used) for the duration of the COVID-19 declaration under Section 564(b)(1) of the Act, 21 U.S.C. section 360bbb-3(b)(1), unless the authorization is terminated or revoked.  Performed at [facility], [address] [place], [place] [address]   Blood Culture  (routine x 2)     Status: None (Preliminary result)   Collection Time: 10/27/20  3:15 AM   Specimen: BLOOD  Result Value Ref Range Status   Specimen Description BLOOD LEFT ARM  Final   Special Requests   Final    BOTTLES DRAWN AEROBIC AND ANAEROBIC Blood Culture results may not be optimal due to an inadequate volume of blood received in culture bottles   Culture   Final    NO GROWTH 4 DAYS Performed at [facility], [address] [place], [place] [address]    Report Status PENDING  Incomplete  Urine culture     Status: Abnormal   Collection Time: 10/27/20  4:20 AM   Specimen: In/Out Cath Urine  Result Value Ref Range Status   Specimen Description IN/OUT CATH URINE  Final   Special Requests   Final    NONE Performed at [facility], [address], [place], [place] [address]    Culture 30,000 COLONIES/mL PSEUDOMONAS AERUGINOSA (A)  Final   Report Status 10/29/2020 FINAL  Final   Organism ID, Bacteria PSEUDOMONAS AERUGINOSA (A)  Final      Susceptibility   Pseudomonas aeruginosa - MIC*    CEFTAZIDIME 2 SENSITIVE Sensitive     CIPROFLOXACIN <=0.25 SENSITIVE Sensitive     GENTAMICIN <=1 SENSITIVE Sensitive     IMIPENEM 0.5 SENSITIVE Sensitive     PIP/TAZO <=4 SENSITIVE Sensitive     CEFEPIME 2 SENSITIVE Sensitive     * 30,000 COLONIES/mL PSEUDOMONAS AERUGINOSA  Blood Culture (routine x 2)     Status: None (Preliminary result)   Collection Time: 10/27/20  9:38 AM   Specimen: BLOOD  Result Value Ref Range Status   Specimen Description BLOOD RIGHT ANTECUBITAL  Final   Special Requests   Final    BOTTLES DRAWN AEROBIC AND ANAEROBIC Blood Culture adequate volume   Culture   Final    NO GROWTH 4 DAYS Performed at [facility], [address], [place], [place] [address]    Report Status PENDING  Incomplete      Studies: No results found.  Scheduled Meds: . acetaminophen  1,000 mg Oral Once  . baclofen  10 mg Oral QID  . cromolyn  100 mg Oral See admin  instructions  . cromolyn  20 mg Nebulization TID  . doxycycline  100 mg Oral Q12H  . enoxaparin (LOVENOX) injection  40 mg Subcutaneous Q24H  . famotidine  40 mg Oral BID  . fluticasone furoate-vilanterol  1 puff Inhalation Daily  . ipratropium-albuterol  3 mL Inhalation BID  . ivabradine  5 mg Oral BID  . levocetirizine  5 mg Oral QPM  . montelukast  10 mg Oral QHS  . predniSONE  20 mg Oral Daily  . pyridostigmine  90 mg Oral Q3H  . sodium chloride flush  10-40 mL Intracatheter Q12H    Continuous Infusions: . ceFEPime (MAXIPIME) IV 2 g (10/31/20 16100619)  . potassium chloride 10 mEq (10/31/20 1522)  . potassium PHOSPHATE IVPB (in mmol)       LOS: 4 days     Briant CedarNkeiruka J Alzina Golda, MD [facility]  If 7PM-7AM, please contact night-coverage [email or web address] 10/31/2020, 3:46 PM

## 2020-10-31 NOTE — Progress Notes (Signed)
This nurse has attempted to assess patient. Patient refusing assessment at this time.Patient asking this nurse questions about what kinds of soap,body lotions and detergent used to wash clothes. This nurse asked patient why she needed that information.Patient stated," She was allergic to [facility] and that she could not be touched by staff members who used downey in their clothing."This nurse told patient downey was not used to wash clothing but used several different brands of detergent at home to wash clothes .Patient then told this nurse not to touch her because she did not want to have a allergic reaction to what ever brand of detergent that I used to wash my clothing in.

## 2020-11-01 DIAGNOSIS — Q796 Ehlers-Danlos syndrome, unspecified: Secondary | ICD-10-CM | POA: Diagnosis not present

## 2020-11-01 DIAGNOSIS — J189 Pneumonia, unspecified organism: Secondary | ICD-10-CM | POA: Diagnosis not present

## 2020-11-01 DIAGNOSIS — R6510 Systemic inflammatory response syndrome (SIRS) of non-infectious origin without acute organ dysfunction: Secondary | ICD-10-CM | POA: Diagnosis not present

## 2020-11-01 DIAGNOSIS — Z9189 Other specified personal risk factors, not elsewhere classified: Secondary | ICD-10-CM | POA: Diagnosis not present

## 2020-11-01 DIAGNOSIS — E871 Hypo-osmolality and hyponatremia: Secondary | ICD-10-CM | POA: Diagnosis not present

## 2020-11-01 LAB — CBC WITH DIFFERENTIAL/PLATELET
Abs Immature Granulocytes: 0.77 10*3/uL — ABNORMAL HIGH (ref 0.00–0.07)
Basophils Absolute: 0 10*3/uL (ref 0.0–0.1)
Basophils Relative: 0 %
Eosinophils Absolute: 0 10*3/uL (ref 0.0–0.5)
Eosinophils Relative: 0 %
HCT: 25.9 % — ABNORMAL LOW (ref 36.0–46.0)
Hemoglobin: 8.4 g/dL — ABNORMAL LOW (ref 12.0–15.0)
Immature Granulocytes: 11 %
Lymphocytes Relative: 11 %
Lymphs Abs: 0.8 10*3/uL (ref 0.7–4.0)
MCH: 26.7 pg (ref 26.0–34.0)
MCHC: 32.4 g/dL (ref 30.0–36.0)
MCV: 82.2 fL (ref 80.0–100.0)
Monocytes Absolute: 0.3 10*3/uL (ref 0.1–1.0)
Monocytes Relative: 4 %
Neutro Abs: 5 10*3/uL (ref 1.7–7.7)
Neutrophils Relative %: 74 %
Platelets: 269 10*3/uL (ref 150–400)
RBC: 3.15 MIL/uL — ABNORMAL LOW (ref 3.87–5.11)
RDW: 15.2 % (ref 11.5–15.5)
WBC: 6.9 10*3/uL (ref 4.0–10.5)
nRBC: 0 % (ref 0.0–0.2)

## 2020-11-01 LAB — BASIC METABOLIC PANEL
Anion gap: 7 (ref 5–15)
BUN: <5 mg/dL — ABNORMAL LOW (ref 6–20)
CO2: 28 mmol/L (ref 22–32)
Calcium: 7.5 mg/dL — ABNORMAL LOW (ref 8.9–10.3)
Chloride: 102 mmol/L (ref 98–111)
Creatinine, Ser: 0.42 mg/dL — ABNORMAL LOW (ref 0.44–1.00)
GFR, Estimated: >60 mL/min (ref >60)
Glucose, Bld: 85 mg/dL (ref 70–99)
Potassium: 3.3 mmol/L — ABNORMAL LOW (ref 3.5–5.1)
Sodium: 137 mmol/L (ref 135–145)

## 2020-11-01 LAB — CULTURE, BLOOD (ROUTINE X 2)
Culture: NO GROWTH
Culture: NO GROWTH
Special Requests: ADEQUATE

## 2020-11-01 LAB — VITAMIN D 25 HYDROXY (VIT D DEFICIENCY, FRACTURES): Vit D, 25-Hydroxy: 73.34 ng/mL (ref 30–100)

## 2020-11-01 MED ORDER — POTASSIUM CHLORIDE 10 MEQ/100ML IV SOLN
10.0000 meq | INTRAVENOUS | Status: AC
  Administered 2020-11-01 (×4): 10 meq via INTRAVENOUS
  Filled 2020-11-01 (×4): qty 100

## 2020-11-01 MED ORDER — LEVOCETIRIZINE DIHYDROCHLORIDE 5 MG PO TABS
5.0000 mg | ORAL_TABLET | Freq: Four times a day (QID) | ORAL | Status: DC
  Administered 2020-11-01 – 2020-11-06 (×20): 5 mg via ORAL
  Filled 2020-11-01 (×18): qty 1

## 2020-11-01 MED ORDER — SODIUM CHLORIDE 0.9 % IV SOLN
100.0000 mg | Freq: Two times a day (BID) | INTRAVENOUS | Status: AC
  Administered 2020-11-01: 100 mg via INTRAVENOUS
  Filled 2020-11-01: qty 100

## 2020-11-01 MED ORDER — FLUTICASONE-SALMETEROL 250-50 MCG/ACT IN AEPB
1.0000 | INHALATION_SPRAY | Freq: Two times a day (BID) | RESPIRATORY_TRACT | Status: DC
  Administered 2020-11-01 – 2020-11-06 (×10): 1 via RESPIRATORY_TRACT
  Filled 2020-11-01 (×2): qty 14

## 2020-11-01 NOTE — Progress Notes (Signed)
PROGRESS NOTE  Brandy [name] ZOX:[id number] DOB: May 04, 1991 DOA: 10/26/2020 PCP: Pcp, No  HPI/Recap of past 24 hours: HPI from Dr Leafy HalfShalhoub  30 year old female with past medical history of myasthenia gravis, Ehlers-Danlos syndrome, mast cell activation syndrome, POTS, protein calorie malnutrition status post PEG tube placement and dysautonomia who presents with complaints of cough, shortness of breath and fever. Patient is an extremely poor historian. Patient explains that for approximately the past 2 to 3 weeks she has been experiencing productive cough of tan-colored sputum, associated with worsening shortness of breath, pleuritic chest pain, subjective intermittent fevers with generalized weakness. Upon EMS arrival, patient was noted to be "hot to the touch."  EMS reports bodily fluids/excrement and poor living conditions with significant amounts of trash around the living space.  EMS also reports noting that a 29-year-old son is living in these conditions.  Neighbors present on scene reported trying to help take care of the child as well as clean up as much as possible. In the ED, patient has been found to have substantial leukocytosis of 14.9 with chest x-ray revealing developing bilateral basilar infiltrates concerning for pneumonia.  Patient was found to be febrile on arrival with temperature of 101.2 F.  Patient was initiated on intravenous fluids with normal saline and initiated on intravenous antibiotics with intravenous doxycycline and ceftriaxone.  Of note, patient has been hospitalized at [facility] twice since patient's arrival to the [place] area from [place] in late 2021.  During patient's initial presentation, the attending provider and neurology mentioned " that for any further hospitalizations the patient is better served at a tertiary care center with multiple specialties available to help care for this patient as her medical diagnoses are rather complex.  Patient is in  need of allergy, immunology, rheumatology all of which are not offered here [facility]   Because of this, the ER provider did attempt to seek transfer to medical [facility], [facility] and [facility], all of which declined transfer due to lack of medical beds.  The hospitalist group here at [facility] was then contacted to assess the patient for admission to the hospital.     Today, patient reports feeling like her myasthenia gravis is flaring up, feels more short of breath, with poor cough.  Able to speak in full sentences.  Neurology consulted     Assessment/Plan: Principal Problem:   SIRS (systemic inflammatory response syndrome) (HCC) Active Problems:   Severe protein-calorie malnutrition (HCC)   Myasthenia gravis (HCC)   Ehlers-Danlos syndrome   Mast cell activation syndrome (HCC)   POTS (postural orthostatic tachycardia syndrome)   Dysautonomia (HCC)   Pneumonia of both lower lobes due to infectious organism   Pain around PEG tube site   Hyponatremia   History of poor personal hygiene   Fever   Pressure injury of skin   CAP Bilateral lower lobe pneumonia Afebrile, with resolved leukocytosis BC x2 NGTD Sputum cultures pending Chest x-ray showed bilateral lower lung zone airspace opacity suggestive of infection Continue cefepime for broader coverage, continue doxycycline Continue supplemental O2  Possible abdominal wall cellulitis surrounding PEG tube site Reports purulent drainage from PEG tube site, with some mild erythema, this is currently improving Patient also reports PEG tube has not been functioning, IR evaluated, stated PEG tube is functioning and patient just wants it changed to a specific PEG tube that is not available [facility]. Plan for [facility] Patient able to eat  by mouth, but uses PEG tube for supplemental feeding with [place] splash Continue antibiotics as above  ?Possible UTI UA unremarkable UC grew 30,000  Pseudomonas aeruginosa (immunosuppressed) Switched to cefepime  Possible myasthenia gravis flare Reported SOB, poor cough as mentioned above Neurology consulted, appreciate rec: Plan to start PLEX treatment,  Recommend elective intubation for respiratory compromise ifVC falls below 15 to 20 mL/kgand or NIFs falls below -20cm/H2O.If poor effort and not sure, can always get ABG to assess for CO2 retention. Elective intubation for airway cmopromise if she has difficulty clearing her secretions Medications that may worsen or trigger MG exacerbation: Class IA antiarrhythmics, magnesium, flouroquinolones, macrolides, aminoglycosides, penicillamine, curare, interferon alpha, botox, quinine. Use with caution:calcium channel blocker,beta blockers andstatins Continue home Mestinon PT/OT  Hypokalemia/hypophosphatemia Replace prn  Anemia of chronic disease Baseline around 9-11 Anemia panel showed iron 25, sats 15, ferritin 206, folate 10.9, vitamin B12 greater than 3000 Daily CBC  Mast cell activation syndrome Continue home prednisone, Dical, Pepcid At high risk for reactions, particularly anaphylactic reactions Close monitoring  Ehlers-Danlos syndrome Outpatient follow-up  Social issues/poor living condition EMS reports human fluids/excrement noted around the house Patient also lives with her 96-year-old son in this poor living condition Social worker/CPS consulted  Severe protein-calorie malnutrition Estimated body mass index is 18.54 kg/m as calculated from the following:   Height as of 07/09/20: 5\' 4"  (1.626 m).   Weight as of this encounter: 49 kg.  Dietitian consulted     Code Status: Full  Family Communication: None at bedside  Disposition Plan: Status is: Inpatient  Remains inpatient appropriate because:Inpatient level of care appropriate due to severity of illness   Dispo: The patient is from: Home              Anticipated d/c is to: Home Vs SNF               Patient currently is not medically stable to d/c.   Difficult to place patient No   Consultants:  None  Procedures:  None  Antimicrobials:  Cefepime  DVT prophylaxis: Lovenox   Objective: Vitals:   10/31/20 1805 10/31/20 1929 11/01/20 0638 11/01/20 0802  BP: (!) 123/92 114/82 (!) 135/111   Pulse: 62 (!) 57 77 86  Resp: 16 19 20 18   Temp: 98.1 F (36.7 C) 98.3 F (36.8 C)    TempSrc: Oral Oral    SpO2: 97% 100% 100% 97%  Weight:        Intake/Output Summary (Last 24 hours) at 11/01/2020 1511 Last data filed at 11/01/2020 1300 Gross per 24 hour  Intake 910 ml  Output 1800 ml  Net -890 ml   Filed Weights   10/28/20 1400  Weight: 49 kg    Exam:  General: NAD, cachectic, frail-appearing, thin  Cardiovascular: S1, S2 present  Respiratory: CTAB  Abdomen: Soft, nontender, nondistended, bowel sounds present, G-tube noted with surrounding erythema  Musculoskeletal: No bilateral pedal edema noted  Skin: Normal  Psychiatry: Teary mood   Data Reviewed: CBC: Recent Labs  Lab 10/28/20 0255 10/29/20 0349 10/30/20 0455 10/31/20 0428 11/01/20 0403  WBC 12.6* 11.5* 7.8 8.8 6.9  NEUTROABS 12.3* 9.6* 7.6 8.4* 5.0  HGB 10.1* 8.7* 8.4* 8.7* 8.4*  HCT 31.3* 27.8* 26.9* 27.4* 25.9*  MCV 81.7 83.5 82.8 82.5 82.2  PLT 261 269 284 322 269   Basic Metabolic Panel: Recent Labs  Lab 10/27/20 0011 10/28/20 0255 10/29/20 0349 10/30/20 0455 10/31/20 0423 10/31/20 0428 11/01/20 0403  NA 127* 134*  135 137  --  136 137  K 3.1* 3.9 3.6 3.2*  --  2.8* 3.3*  CL 86* 99 101 106  --  101 102  CO2 --  25 28  GLUCOSE 87 105* 93 80  --  100* 85  BUN 22* --  6 <5*  CREATININE 1.03* 0.56 0.66 0.55  --  0.49 0.42*  CALCIUM 7.2* 7.1* 7.5* 7.5*  --  7.6* 7.5*  MG 2.2  --   --  1.8 1.8  --   --   PHOS  --   --   --   --  1.2*  --   --    GFR: Estimated Creatinine Clearance: 80.3 mL/min (A) (by C-G formula based on SCr of 0.42 mg/dL (L)). Liver  Function Tests: Recent Labs  Lab 10/27/20 0011 10/28/20 0255  AST 56* 33  ALT 15 14  ALKPHOS 45 37*  BILITOT 2.1* 2.4*  PROT 5.2* 4.6*  ALBUMIN 1.6* 1.3*   No results for input(s): LIPASE, AMYLASE in the last 168 hours. No results for input(s): AMMONIA in the last 168 hours. Coagulation Profile: Recent Labs  Lab 10/27/20 0011  INR 1.0   Cardiac Enzymes: No results for input(s): CKTOTAL, CKMB, CKMBINDEX, TROPONINI in the last 168 hours. BNP (last 3 results) No results for input(s): PROBNP in the last 8760 hours. HbA1C: No results for input(s): HGBA1C in the last 72 hours. CBG: No results for input(s): GLUCAP in the last 168 hours. Lipid Profile: No results for input(s): CHOL, HDL, LDLCALC, TRIG, CHOLHDL, LDLDIRECT in the last 72 hours. Thyroid Function Tests: No results for input(s): TSH, T4TOTAL, FREET4, T3FREE, THYROIDAB in the last 72 hours. Anemia Panel: No results for input(s): VITAMINB12, FOLATE, FERRITIN, TIBC, IRON, RETICCTPCT in the last 72 hours. Urine analysis:    Component Value Date/Time   COLORURINE AMBER (A) 10/27/2020 0420   APPEARANCEUR HAZY (A) 10/27/2020 0420   LABSPEC 1.019 10/27/2020 0420   PHURINE 6.0 10/27/2020 0420   GLUCOSEU NEGATIVE 10/27/2020 0420   HGBUR NEGATIVE 10/27/2020 0420   BILIRUBINUR NEGATIVE 10/27/2020 0420   KETONESUR 5 (A) 10/27/2020 0420   PROTEINUR 30 (A) 10/27/2020 0420   NITRITE NEGATIVE 10/27/2020 0420   LEUKOCYTESUR NEGATIVE 10/27/2020 0420   Sepsis Labs: (procalcitonin:4,lacticidven:4)  ) Recent Results (from the past 240 hour(s))  Resp Panel by RT-PCR (Flu A&B, Covid) Nasopharyngeal Swab     Status: None   Collection Time: 10/27/20 12:11 AM   Specimen: Nasopharyngeal Swab; Nasopharyngeal(NP) swabs in vial transport medium  Result Value Ref Range Status   SARS Coronavirus 2 by RT PCR NEGATIVE NEGATIVE Final    Comment: (NOTE) SARS-CoV-2 target nucleic acids are NOT DETECTED.  The SARS-CoV-2 RNA is  generally detectable in upper respiratory specimens during the acute phase of infection. The lowest concentration of SARS-CoV-2 viral copies this assay can detect is 138 copies/mL. A negative result does not preclude SARS-Cov-2 infection and should not be used as the sole basis for treatment or other patient management decisions. A negative result may occur with  improper specimen collection/handling, submission of specimen other than nasopharyngeal swab, presence of viral mutation(s) within the areas targeted by this assay, and inadequate number of viral copies(<138 copies/mL). A negative result must be combined with clinical observations, patient history, and epidemiological information. The expected result is Negative.  Fact Sheet for Patients:  BloggerCourse.com  Fact Sheet for Healthcare Providers:  SeriousBroker.it  This test is no  t yet approved or cleared by the [place] and  has been authorized for detection and/or diagnosis of SARS-CoV-2 by FDA under an Emergency Use Authorization (EUA). This EUA will remain  in effect (meaning this test can be used) for the duration of the COVID-19 declaration under Section 564(b)(1) of the Act, 21 U.S.C.section 360bbb-3(b)(1), unless the authorization is terminated  or revoked sooner.       Influenza A by PCR NEGATIVE NEGATIVE Final   Influenza B by PCR NEGATIVE NEGATIVE Final    Comment: (NOTE) The Xpert Xpress SARS-CoV-2/FLU/RSV plus assay is intended as an aid in the diagnosis of influenza from Nasopharyngeal swab specimens and should not be used as a sole basis for treatment. Nasal washings and aspirates are unacceptable for Xpert Xpress SARS-CoV-2/FLU/RSV testing.  Fact Sheet for Patients: BloggerCourse.com  Fact Sheet for Healthcare Providers: SeriousBroker.it  This test is not yet approved or cleared by the [place] FDA and has been authorized for detection and/or diagnosis of SARS-CoV-2 by FDA under an Emergency Use Authorization (EUA). This EUA will remain in effect (meaning this test can be used) for the duration of the COVID-19 declaration under Section 564(b)(1) of the Act, 21 U.S.C. section 360bbb-3(b)(1), unless the authorization is terminated or revoked.  Performed at [facility], [address] [place], [place] [address]   Blood Culture (routine x 2)     Status: None   Collection Time: 10/27/20  3:15 AM   Specimen: BLOOD  Result Value Ref Range Status   Specimen Description BLOOD LEFT ARM  Final   Special Requests   Final    BOTTLES DRAWN AEROBIC AND ANAEROBIC Blood Culture results may not be optimal due to an inadequate volume of blood received in culture bottles   Culture   Final    NO GROWTH 5 DAYS Performed at [facility], [address] [place], [place] [address]    Report Status 11/01/2020 FINAL  Final  Urine culture     Status: Abnormal   Collection Time: 10/27/20  4:20 AM   Specimen: In/Out Cath Urine  Result Value Ref Range Status   Specimen Description IN/OUT CATH URINE  Final   Special Requests   Final    NONE Performed at [facility], [address], [place], [place] [address]    Culture 30,000 COLONIES/mL PSEUDOMONAS AERUGINOSA (A)  Final   Report Status 10/29/2020 FINAL  Final   Organism ID, Bacteria PSEUDOMONAS AERUGINOSA (A)  Final      Susceptibility   Pseudomonas aeruginosa - MIC*    CEFTAZIDIME 2 SENSITIVE Sensitive     CIPROFLOXACIN <=0.25 SENSITIVE Sensitive     GENTAMICIN <=1 SENSITIVE Sensitive     IMIPENEM 0.5 SENSITIVE Sensitive     PIP/TAZO <=4 SENSITIVE Sensitive     CEFEPIME 2 SENSITIVE Sensitive     * 30,000 COLONIES/mL PSEUDOMONAS AERUGINOSA  Blood Culture (routine x 2)     Status: None   Collection Time: 10/27/20  9:38 AM   Specimen: BLOOD  Result Value Ref Range Status   Specimen Description BLOOD RIGHT  ANTECUBITAL  Final   Special Requests   Final    BOTTLES DRAWN AEROBIC AND ANAEROBIC Blood Culture adequate volume   Culture   Final    NO GROWTH 5 DAYS Performed at [facility], [address], [place], [place] [address]    Report Status 11/01/2020 FINAL  Final      Studies: No  results found.  Scheduled Meds: . acetaminophen  1,000 mg Oral Once  . baclofen  10 mg Oral QID  . cromolyn  200 mg Oral TID AC & HS  . cromolyn  20 mg Nebulization TID  . enoxaparin (LOVENOX) injection  40 mg Subcutaneous Q24H  . famotidine  40 mg Oral BID  . fluticasone-salmeterol  1 puff Inhalation BID  . ipratropium-albuterol  3 mL Inhalation BID  . ivabradine  5 mg Oral BID  . levocetirizine  5 mg Oral QID  . predniSONE  20 mg Oral Daily  . pyridostigmine  90 mg Oral Q3H  . sodium chloride flush  10-40 mL Intracatheter Q12H    Continuous Infusions: . ceFEPime (MAXIPIME) IV 2 g (11/01/20 1610)  . potassium chloride 10 mEq (11/01/20 1434)     LOS: 5 days     Briant Cedar, MD [facility]  If 7PM-7AM, please contact night-coverage [email or web address] 11/01/2020, 3:11 PM

## 2020-11-01 NOTE — Progress Notes (Signed)
NIF= -10 with poor effort. Pt refused VC.

## 2020-11-01 NOTE — Progress Notes (Signed)
NIF -15. Pt did not do VC

## 2020-11-01 NOTE — Consult Note (Addendum)
NEUROLOGY CONSULTATION NOTE   Date of service: Nov 01, 2020 Patient Name: Brandy [name] MRN:  [id number] DOB:  07-30-1990 Reason for consult: "Myasthenia gravis" Requesting Provider: Briant Cedar, MD _ _ _   _ __   _ __ _ _  __ __   _ __   __ _  History of Present Illness  Brandy [name] is a 30 y.o. female with PMH significant for Myalgic encephalomyelitis, MDD, PTSD, anxiety, Ehler-Danlos syndrome with cervical instability, mast cell activation syndrome, POTS, seronegative myasthenia (inconclusive anti-LRP4 Ab and never had an EMG) on pyridostigmine 90mg  8 time per day (a high dose) who is admitted with 2-3 weeks of productive cough, shortness of breath, pleuritic chest pain, subjective intermittent fevers with generalized weakness. She was diagnosed with Community Acquired Pneumonia and is being treated with Cefepime and Doxycycline.  She reported feeling like her Myasthenia gravis is flaring up and neurology consulted to assist with further workup. Of note, she was given Magnesium 2G IV once yesterday afternoon. She is also on Doxycycline which is usually well tolerated but is occasionally associated with Myasthenia flare up.  Patient reports that over the last day, she feels that her breathing is getting more difficult and more easily fatiguable. She speaks in full sentences but does endorse some difficulty with her secretions and seen to be using a suction catheter every few mins to help with secretions.   Endorses that last time she felt the same and improved significantly with PLEX, she was able to do much more when she got home.  NIFs this AM @ -10, poor effort documented thou. She refused VC this AM.   ROS   Constitutional Denies weight loss, fever and chills.  HEENT Denies changes in vision. Endorses problems with hearing.  Respiratory Some SOB and cough.  CV Denies palpitations and CP  GI Denies abdominal pain, + nausea, no vomiting and diarrhea.  GU Denies  dysuria and urinary frequency.  MSK Denies myalgia and joint pain.  Skin Denies rash and pruritus.  Neurological Denies headache and syncope.  Psychiatric Denies recent changes in mood. Denies anxiety and depression.   Past History   Past Medical History:  Diagnosis Date  . Dysautonomia (HCC) 06/28/2020  . Ehlers-Danlos syndrome 06/28/2020  . 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 Myasthenia gravis (HCC)   . POTS (postural orthostatic tachycardia syndrome) 06/28/2020  . Severe protein-calorie malnutrition (HCC) 05/20/2020   Past Surgical History:  Procedure Laterality Date  . IR GJ TUBE CHANGE  07/11/2020  . IR REMOVAL TUN CV CATH W/O FL  06/03/2020   Family History  Adopted: Yes   Social History   Socioeconomic History  . Marital status: Single    Spouse name: Not on file  . Number of children: Not on file  . Years of education: Not on file  . Highest education level: Not on file  Occupational History  . Not on file  Tobacco Use  . Smoking status: Former 06/05/2020  . Smokeless tobacco: Never Used  Substance and Sexual Activity  . Alcohol use: Not on file  . Drug use: Not on file  . Sexual activity: Not on file  Other Topics Concern  . Not on file  Social History Narrative  . Not on file   Social Determinants of Health   Financial Resource Strain: Not on file  Food Insecurity: Not on file  Transportation Needs: Not on file  Physical Activity: Not on file  Stress: Not  on file  Social Connections: Not on file   Allergies  Allergen Reactions  . Aspirin   . Nsaids   . Quinolones   . Sulfa Antibiotics   . Telithromycin   . Fludrocortisone   . Ambien [Zolpidem]   . Amitriptyline   . Beef-Potatoes-Spinach [Compleat]   . Botulinum Toxins   . Chlorhexidine Hives  . Ciprofloxacin   . Codeine   . Contrast Media [Iodinated Diagnostic Agents]   . Creon [Pancrelipase (Lip-Prot-Amyl)]   . Cymbalta [Duloxetine Hcl]   . Dextrans   . Ergotamine   . Fluoxetine      All SSRIs All SSRIs   . Gabapentin   . Gallamine   . Hydrocodone   . Iodine   . Lactose Intolerance (Gi)   . Levofloxacin   . Lunesta [Eszopiclone]   . Macrolides And Ketolides   . Maprotiline   . Marplan [Isocarboxazid]     Any MAOI contraindicated  . Midodrine   . Morphine And Related   . Nitrous Oxide   . Oxycodone   . Pethidine [Meperidine]   . Phenergan [Promethazine]   . Phenobarbital   . Pyridium [Phenazopyridine]   . Reglan [Metoclopramide]   . Remeron [Mirtazapine]     Noradrenergic antagonist  . Salicylates   . Savella [Milnacipran]   . Scopolamine   . Sumatriptan   . Tape   . Topiramate   . Tricyclic Antidepressants   . Tums [Calcium Carbonate]     Patient reports allergic.  RN to find out adverse reaction and update allergy field.   . Vancomycin   . Verapamil     Medications   Medications Prior to Admission  Medication Sig Dispense Refill Last Dose  . albuterol (VENTOLIN HFA) 108 (90 Base) MCG/ACT inhaler Inhale into the lungs every 6 (six) hours as needed for wheezing or shortness of breath.   10/26/2020 at Unknown time  . baclofen (LIORESAL) 10 MG tablet Take 1 tablet (10 mg total) by mouth See admin instructions. Take one 10 mg tablet four times a day at 6AM, 12PM, 6PM, and 12AM 90 each 0 10/26/2020 at Unknown time  . CORLANOR 5 MG TABS tablet Take 1 tablet (5 mg total) by mouth 2 (two) times daily. 6am,6pm 60 tablet 0 10/26/2020 at 0600  . cromolyn (GASTROCROM) 100 MG/5ML solution Take 100 mg by mouth See admin instructions. 2 ampule in water taken 4 times a day before meals/bed and PRN   10/26/2020 at Unknown time  . cromolyn (INTAL) 20 MG/2ML nebulizer solution Take 20 mg by nebulization See admin instructions. 1 vial taken 3 times a day and PRN   10/26/2020 at Unknown time  . diphenhydrAMINE-zinc acetate (BENADRYL) cream Apply 1-2 application topically daily as needed (Rash from Mast Cell and Allergies).   10/26/2020 at Unknown time  . EPINEPHrine 0.3 mg/0.3  mL IJ SOAJ injection Inject 0.3 mg into the muscle as needed for anaphylaxis.   PRN  . famotidine (PEPCID) 40 MG tablet Take 40 mg by mouth See admin instructions. Take one 40 mg tablet twice a day at 6AM and 6PM.   10/26/2020 at Unknown time  . ipratropium-albuterol (DUONEB) 0.5-2.5 (3) MG/3ML SOLN Inhale 3 mLs into the lungs See admin instructions. Qid and prn   10/26/2020 at Unknown time  . ketotifen (ZADITOR) 0.025 % ophthalmic solution Place 1 drop into both eyes daily as needed (Mast cell, Allergies, MCS/EI).   10/26/2020 at Unknown time  . levocetirizine (XYZAL) 5 MG tablet Take  10 mg by mouth in the morning and at bedtime. 6am,6pm   10/26/2020 at Unknown time  . LORazepam (ATIVAN) 0.5 MG tablet Take 1 tablet (0.5 mg total) by mouth 2 (two) times daily as needed for anxiety. 60 tablet 0 10/26/2020 at 0600  . ondansetron (ZOFRAN) 8 MG tablet Take 8 mg by mouth every 6 (six) hours as needed for nausea or vomiting.   10/26/2020 at Unknown time  . ondansetron (ZOFRAN-ODT) 4 MG disintegrating tablet Take 4 mg by mouth every 8 (eight) hours as needed for nausea or vomiting.   10/26/2020 at Unknown time  . Potassium Citrate 99 MG CAPS Take 99 mg by mouth daily. 6 or 9pm   10/26/2020 at 0600  . predniSONE (DELTASONE) 20 MG tablet Take 20 mg by mouth daily.   10/26/2020 at Unknown time  . predniSONE (DELTASONE) 5 MG tablet Take 5 tablets (25 mg total) by mouth daily with breakfast. (Patient taking differently: Take 25 mg by mouth daily with breakfast. 6am) 150 tablet 1 10/26/2020 at Unknown time  . pyridostigmine (MESTINON) 60 MG tablet Take 1.5 tablets (90 mg total) by mouth every 3 (three) hours. 12am,3am,6am,9am,12pm,3pm,6pm,9pm 90 tablet 0 10/26/2020 at Unknown time  . vitamin B-12 (CYANOCOBALAMIN) 1000 MCG tablet Take 1,000 mcg by mouth daily. 6 or 9pm   10/26/2020 at 0600  . Vitamin D, Ergocalciferol, (DRISDOL) 1.25 MG (50000 UNIT) CAPS capsule Take 50,000 Units by mouth 2 (two) times a week.   Past Week at Unknown time   . WIXELA INHUB 250-50 MCG/ACT AEPB Inhale 1 puff into the lungs 2 (two) times daily. 6am and 6pm   10/26/2020 at 0600  . zafirlukast (ACCOLATE) 20 MG tablet Take 1 tablet (20 mg total) by mouth 2 (two) times daily before a meal. 6am 6pm 60 tablet 0 10/26/2020 at 0600     Vitals   Vitals:   10/31/20 1805 10/31/20 1929 11/01/20 0638 11/01/20 0802  BP: (!) 123/92 114/82 (!) 135/111   Pulse: 62 (!) 57 77 86  Resp: 16 19 20 18   Temp: 98.1 F (36.7 C) 98.3 F (36.8 C)    TempSrc: Oral Oral    SpO2: 97% 100% 100% 97%  Weight:         Body mass index is 18.54 kg/m.  Physical Exam   General: Laying comfortably in bed; in no acute distress. HENT: Normal oropharynx and mucosa. Normal external appearance of ears and nose. Neck: Supple, no pain or tenderness CV: No JVD. No peripheral edema. Pulmonary: Symmetric Chest rise. Normal respiratory effort. Abdomen: Soft to touch, non-tender.  Ext: No cyanosis, edema, or deformity Skin: No rash. Normal palpation of skin.  Musculoskeletal: Normal digits and nails by inspection. No clubbing.  Neurologic Examination  Mental status/Cognition: Alert, oriented to self, place, month and year, good attention. Speech/language: Fluent, comprehension intact, object naming intact, repetition intact. Cranial nerves:   CN II Pupils equal and reactive to light, no VF deficits   CN III,IV,VI EOM intact, no gaze preference or deviation, no nystagmus   CN V normal sensation in V1, V2, and V3 segments bilaterally   CN VII no asymmetry, no nasolabial fold flattening   CN VIII normal hearing to speech   CN IX & X normal palatal elevation, no uvular deviation   CN XI 5/5 head turn and 5/5 shoulder shrug bilaterally   CN XII midline tongue protrusion    Motor:  Muscle bulk: poor, tone: normal, pronator drift none tremor none Mvmt Root Nerve  Muscle Right Left Comments  SA C5/6 Ax Deltoid     EF C5/6 Mc Biceps 4 4   EE C6/7/8 Rad Triceps 4 4   WF C6/7 Med FCR      WE C7/8 PIN ECU     F Ab C8/T1 U ADM/FDI 5 5   HF L1/2/3 Fem Illopsoas 4 4   KE L2/3/4 Fem Quad 4 4   DF L4/5 D Peron Tib Ant 5 5   PF S1/2 Tibial Grc/Sol 5 5    Reflexes:  Right Left Comments  Pectoralis      Biceps (C5/6) 1 1   Brachioradialis (C5/6) 2 2    Triceps (C6/7) 1 1    Patellar (L3/4) 2 2    Achilles (S1)      Hoffman      Plantar     Jaw jerk    Sensation:  Light touch Intact throughout   Pin prick    Temperature    Vibration   Proprioception    Coordination/Complex Motor:  - Finger to Nose intact BL - Heel to shin unabel to assess. - Rapid alternating movement are slowed BL - Gait: deferred.  Labs   CBC:  Recent Labs  Lab 10/31/20 0428 11/01/20 0403  WBC 8.8 6.9  NEUTROABS 8.4* 5.0  HGB 8.7* 8.4*  HCT 27.4* 25.9*  MCV 82.5 82.2  PLT 322 269    Basic Metabolic Panel:  Lab Results  Component Value Date   NA 137 11/01/2020   K 3.3 (L) 11/01/2020   CO2 28 11/01/2020   GLUCOSE 85 11/01/2020   BUN <5 (L) 11/01/2020   CREATININE 0.42 (L) 11/01/2020   CALCIUM 7.5 (L) 11/01/2020   GFRNONAA >60 11/01/2020   Lipid Panel: No results found for: LDLCALC HgbA1c:  Lab Results  Component Value Date   HGBA1C 4.7 (L) 05/16/2020   Urine Drug Screen: No results found for: LABOPIA, COCAINSCRNUR, LABBENZ, AMPHETMU, THCU, LABBARB  Alcohol Level No results found for: [facility]    Impression   Brandy [name] is a 30 y.o. female with PMH significant for Myalgic encephalomyelitis, MDD, PTSD, anxiety, Ehler-Danlos syndrome with cervical instability, mast cell activation syndrome, POTS, seronegative myasthenia (inconclusive anti-LRP4 Ab and never had an EMG) on pyridostigmine  8 time per day (a high dose) who is admitted with 2-3 weeks of productive cough, shortness of breath, pleuritic chest pain, subjective intermittent fevers with generalized weakness. She was diagnosed with Community Acquired Pneumonia and is being treated with Cefepime and  Doxycycline. Had some IV Magnesium yesterday and been reporting of worsening fatigue and shortness of breath since yesterday and its worse today.  I don't think the NIFs are reliable as it seems to be that she is not cooperating with them and refusing VCs.  Clinically, she can carry out a conversation with me, she does seem to have a aynkauer suction catheter in her lap and I saw her using it every couple mins to suction her saliva from her mouth.  As for her diagnosis of Myasthenia. She showed me notes from her prior neurologist on her phone with an indeterminate LRP4 in the past but she does not have positive antibodies. She has not seen a neurologist in town since she moved from [place] last year. I did discuss with her that she needs an EMG outpatient and follow up with Neuro-muscular. I will re-order Myasthenia labs and treat her with PLEX this time, specially since she is having trouble with secretions.  Primary  Diagnosis:  Myasthenia gravis flare up.  Recommendations  - PLEX every other day for 5 sessions. - Every 8 hours NIFs and VC - NPO until swallow eval - Recommend elective intubation for respiratory compromise if VC falls below 15 to 20 mL/kg and or NIFs falls below -20cm/H2O. If poor effort and not sure, can always get ABG to assess for CO2 retention. Elective intubation for airway cmopromise if she has difficulty clearing her secretions. Oxygen saturation should not be used to make decision regarding intubation. - Recommend Mestinon PO 90mg  every 3 hours(high but home dose). If unable to swallow, can switch from PO to IV.(30mg  PO is equivalent to 1mg  IV). - Myasthenia gravis panel with MusK Antibody and Anti-LRP4 antibody - Medications that may worsen or trigger MG exacerbation: Class IA antiarrhythmics, magnesium, flouroquinolones, macrolides, aminoglycosides, penicillamine, curare, interferon alpha, botox, quinine. Use with caution: calcium channel blocker, beta blockers and  statins.  ______________________________________________________________________   Thank you for the opportunity to take part in the care of this patient. If you have any further questions, please contact the neurology consultation attending.  Signed,  [facility] Pager Number _ _ _   _ __   _ __ _ _  __ __   _ __   __ _

## 2020-11-01 NOTE — Progress Notes (Signed)
Talked to patient today about telemetry and why it was needed patient refuses because the sticky substance makes her break out.  Advised provider

## 2020-11-02 DIAGNOSIS — G7000 Myasthenia gravis without (acute) exacerbation: Secondary | ICD-10-CM

## 2020-11-02 DIAGNOSIS — E871 Hypo-osmolality and hyponatremia: Secondary | ICD-10-CM | POA: Diagnosis not present

## 2020-11-02 DIAGNOSIS — J189 Pneumonia, unspecified organism: Secondary | ICD-10-CM | POA: Diagnosis not present

## 2020-11-02 DIAGNOSIS — Z9189 Other specified personal risk factors, not elsewhere classified: Secondary | ICD-10-CM | POA: Diagnosis not present

## 2020-11-02 DIAGNOSIS — Q796 Ehlers-Danlos syndrome, unspecified: Secondary | ICD-10-CM | POA: Diagnosis not present

## 2020-11-02 LAB — CBC WITH DIFFERENTIAL/PLATELET
Abs Immature Granulocytes: 1.06 10*3/uL — ABNORMAL HIGH (ref 0.00–0.07)
Basophils Absolute: 0 10*3/uL (ref 0.0–0.1)
Basophils Relative: 0 %
Eosinophils Absolute: 0 10*3/uL (ref 0.0–0.5)
Eosinophils Relative: 0 %
HCT: 27.5 % — ABNORMAL LOW (ref 36.0–46.0)
Hemoglobin: 8.7 g/dL — ABNORMAL LOW (ref 12.0–15.0)
Immature Granulocytes: 11 %
Lymphocytes Relative: 11 %
Lymphs Abs: 1.1 10*3/uL (ref 0.7–4.0)
MCH: 26.2 pg (ref 26.0–34.0)
MCHC: 31.6 g/dL (ref 30.0–36.0)
MCV: 82.8 fL (ref 80.0–100.0)
Monocytes Absolute: 0.4 10*3/uL (ref 0.1–1.0)
Monocytes Relative: 4 %
Neutro Abs: 7.4 10*3/uL (ref 1.7–7.7)
Neutrophils Relative %: 74 %
Platelets: 328 10*3/uL (ref 150–400)
RBC: 3.32 MIL/uL — ABNORMAL LOW (ref 3.87–5.11)
RDW: 15.3 % (ref 11.5–15.5)
WBC: 10 10*3/uL (ref 4.0–10.5)
nRBC: 0 % (ref 0.0–0.2)

## 2020-11-02 LAB — BASIC METABOLIC PANEL
Anion gap: 6 (ref 5–15)
BUN: <5 mg/dL — ABNORMAL LOW (ref 6–20)
CO2: 27 mmol/L (ref 22–32)
Calcium: 7.8 mg/dL — ABNORMAL LOW (ref 8.9–10.3)
Chloride: 104 mmol/L (ref 98–111)
Creatinine, Ser: 0.5 mg/dL (ref 0.44–1.00)
GFR, Estimated: >60 mL/min (ref >60)
Glucose, Bld: 101 mg/dL — ABNORMAL HIGH (ref 70–99)
Potassium: 3.9 mmol/L (ref 3.5–5.1)
Sodium: 137 mmol/L (ref 135–145)

## 2020-11-02 LAB — PHOSPHORUS: Phosphorus: 1.9 mg/dL — ABNORMAL LOW (ref 2.5–4.6)

## 2020-11-02 MED ORDER — ACETAMINOPHEN 325 MG PO TABS: 650.0000 mg | ORAL_TABLET | ORAL | Status: DC | PRN

## 2020-11-02 MED ORDER — CALCIUM CARBONATE ANTACID 500 MG PO CHEW
2.0000 | CHEWABLE_TABLET | ORAL | Status: AC
Start: 2020-11-02 — End: 2020-11-02
  Filled 2020-11-02: qty 2

## 2020-11-02 MED ORDER — ONDANSETRON 4 MG PO TBDP
4.0000 mg | ORAL_TABLET | Freq: Four times a day (QID) | ORAL | Status: DC | PRN
  Administered 2020-11-03 – 2020-11-05 (×2): 4 mg via ORAL
  Filled 2020-11-02 (×4): qty 1

## 2020-11-02 MED ORDER — ENOXAPARIN SODIUM 40 MG/0.4ML IJ SOSY
40.0000 mg | PREFILLED_SYRINGE | INTRAMUSCULAR | Status: DC
  Filled 2020-11-02: qty 0.4

## 2020-11-02 MED ORDER — ACD FORMULA A 0.73-2.45-2.2 GM/100ML VI SOLN
1000.0000 mL | Status: DC
  Filled 2020-11-02 (×2): qty 1000

## 2020-11-02 MED ORDER — HEPARIN SODIUM (PORCINE) 1000 UNIT/ML IJ SOLN: 1000.0000 [IU] | Freq: Once | INTRAMUSCULAR | Status: DC

## 2020-11-02 MED ORDER — CEFAZOLIN SODIUM-DEXTROSE 2-4 GM/100ML-% IV SOLN: 2.0000 g | INTRAVENOUS | Status: DC

## 2020-11-02 MED ORDER — DIPHENHYDRAMINE HCL 25 MG PO CAPS
25.0000 mg | ORAL_CAPSULE | Freq: Four times a day (QID) | ORAL | Status: DC | PRN
  Filled 2020-11-02 (×3): qty 1

## 2020-11-02 MED ORDER — SODIUM CHLORIDE 0.9 % IV SOLN
Freq: Once | INTRAVENOUS | Status: DC
  Filled 2020-11-02: qty 200

## 2020-11-02 NOTE — Progress Notes (Signed)
Neurology Progress Note Patient seen but not examined as she refused examination-she was extremely bothered by the smell of my cologne. I spoke with her over the phone with her permission. She reports her swallowing is somewhat better but not back to baseline. She has an appointment with a neurologist at [facility] in June. She has been asked to come off of Mestinon for an EMG planned outpatient but I do not recommend we do that at this time if we are considering that she is in some sort of myasthenic crisis.  Assessment and recommendations: - Question of myasthenia gravis and MG exacerbation in the setting of an acute systemic infection - Multiple other medical comorbidities as listed in the official consultation note  Recommendations: - HD catheter placement for plasma exchange.  She has been wanting to do this as an outpatient because her dietary needs and other needs are not being met at the hospital.  I spoke to Dr. Horris Latino to see if that might be possible.  He should be fine from my and as long as her NIF, FVC's and diet are all doable as an outpatient. - She should maintain her outpatient appointment with outpatient neurology.  I would not recommend changing or decreasing Mestinon at this time. - Myasthenia labs have been sent-follow-up will be outpatient.  -- Amie Portland, MD Neurologist [facility] Pager: [phone]

## 2020-11-02 NOTE — Plan of Care (Signed)

## 2020-11-02 NOTE — Progress Notes (Signed)
CN Nikki spoke with the pt on the phone and as claimed by [employer], already explained to pt  That there were no other staff can call to help out with the problem, a staff that do not have cologne nor washed their cloth with unscented detergent, pt only agreed that CN Lowella Bandy can come just to give the due med

## 2020-11-02 NOTE — Progress Notes (Signed)
RT note. Attempted again to have patient due nif/vc. Patient refusing again at this time and stated she has done it multiple times already. RT will continue to monitor.

## 2020-11-02 NOTE — Consult Note (Signed)
Chief Complaint: Patient was seen in consultation today for tunneled plasma pheresis catheter placement Chief Complaint  Patient presents with  . Shortness of Breath   at the request of Dr Wilford Corner   Supervising Physician: Richarda Overlie  Patient Status: [facility] - In-pt  History of Present Illness: Brandy [name] is a 30 y.o. female   Myasthenia gravis,Ehlers-Danlos syndrome, mast cell activation syndrome, POTS, protein calorie malnutrition status post PEG tube placement and dysautonomia. Presents with cough; sob and fever.  CAP Bilateral lower lobe pneumonia Possible myasthenia gravis flare---Medications that may worsen or trigger MG exacerbation: Class IA antiarrhythmics, magnesium, flouroquinolones, macrolides, aminoglycosides, penicillamine, curare, interferon alpha, botox, quinine. Use with caution:calcium channel blocker,beta blockers andstatins  Was seen by Neurology Dr Wilford Corner Recommending plasma pheresis tunneled cath placement in IR Will plan for 5/17  Past Medical History:  Diagnosis Date  . Dysautonomia (HCC) 06/28/2020  . Ehlers-Danlos syndrome 06/28/2020  . 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 Myasthenia gravis (HCC)   . POTS (postural orthostatic tachycardia syndrome) 06/28/2020  . Severe protein-calorie malnutrition (HCC) 05/20/2020    Past Surgical History:  Procedure Laterality Date  . IR GJ TUBE CHANGE  07/11/2020  . IR REMOVAL TUN CV CATH W/O FL  06/03/2020    Allergies: Aspirin, Nsaids, Quinolones, Sulfa antibiotics, Telithromycin, Fludrocortisone, Ambien [zolpidem], Amitriptyline, Beef-potatoes-spinach [compleat], Botulinum toxins, Chlorhexidine, Ciprofloxacin, Codeine, Contrast media [iodinated diagnostic agents], Creon [pancrelipase (lip-prot-amyl)], Cymbalta [duloxetine hcl], Dextrans, Ergotamine, Fluoxetine, Gabapentin, Gallamine, Hydrocodone, Iodine, Lactose intolerance (gi), Levofloxacin, Lunesta [eszopiclone], Macrolides and ketolides,  Maprotiline, Marplan [isocarboxazid], Midodrine, Morphine and related, Nitrous oxide, Oxycodone, Pethidine [meperidine], Phenergan [promethazine], Phenobarbital, Pyridium [phenazopyridine], Reglan [metoclopramide], Remeron [mirtazapine], Salicylates, Savella [milnacipran], Scopolamine, Sumatriptan, Tape, Topiramate, Tricyclic antidepressants, Tums [calcium carbonate], Vancomycin, and Verapamil  Medications: Prior to Admission medications   Medication Sig Start Date End Date Taking? Authorizing Provider  albuterol (VENTOLIN HFA) 108 (90 Base) MCG/ACT inhaler Inhale into the lungs every 6 (six) hours as needed for wheezing or shortness of breath.   Yes [provider]  baclofen (LIORESAL) 10 MG tablet Take 1 tablet (10 mg total) by mouth See admin instructions. Take one 10 mg tablet four times a day at 6AM, 12PM, 6PM, and 12AM 07/11/20  Yes Westley Chandler, MD  CORLANOR 5 MG TABS tablet Take 1 tablet (5 mg total) by mouth 2 (two) times daily. 6am,6pm 07/11/20  Yes Westley Chandler, MD  cromolyn (GASTROCROM) 100 MG/5ML solution Take 100 mg by mouth See admin instructions. 2 ampule in water taken 4 times a day before meals/bed and PRN   Yes [provider]  cromolyn (INTAL) 20 MG/2ML nebulizer solution Take 20 mg by nebulization See admin instructions. 1 vial taken 3 times a day and PRN   Yes [provider]  diphenhydrAMINE-zinc acetate (BENADRYL) cream Apply 1-2 application topically daily as needed (Rash from Mast Cell and Allergies).   Yes [provider]  EPINEPHrine 0.3 mg/0.3 mL IJ SOAJ injection Inject 0.3 mg into the muscle as needed for anaphylaxis.   Yes [provider]  famotidine (PEPCID) 40 MG tablet Take 40 mg by mouth See admin instructions. Take one 40 mg tablet twice a day at 6AM and 6PM.   Yes [provider]  ipratropium-albuterol (DUONEB) 0.5-2.5 (3) MG/3ML SOLN Inhale 3 mLs into the lungs See admin instructions. Qid and prn   Yes  [provider]  ketotifen (ZADITOR) 0.025 % ophthalmic solution Place 1 drop into both eyes daily as needed (Mast  cell, Allergies, MCS/EI).   Yes [provider]  levocetirizine (XYZAL) 5 MG tablet Take 10 mg by mouth in the morning and at bedtime. 6am,6pm 06/01/20  Yes [provider]  LORazepam (ATIVAN) 0.5 MG tablet Take 1 tablet (0.5 mg total) by mouth 2 (two) times daily as needed for anxiety. 10/20/20  Yes Zena Amos, MD  ondansetron (ZOFRAN) 8 MG tablet Take 8 mg by mouth every 6 (six) hours as needed for nausea or vomiting.   Yes [provider]  ondansetron (ZOFRAN-ODT) 4 MG disintegrating tablet Take 4 mg by mouth every 8 (eight) hours as needed for nausea or vomiting.   Yes [provider]  Potassium Citrate 99 MG CAPS Take 99 mg by mouth daily. 6 or 9pm   Yes [provider]  predniSONE (DELTASONE) 20 MG tablet Take 20 mg by mouth daily. 08/23/20  Yes [provider]  predniSONE (DELTASONE) 5 MG tablet Take 5 tablets (25 mg total) by mouth daily with breakfast. Patient taking differently: Take 25 mg by mouth daily with breakfast. 6am 05/22/20  Yes Lewie Chamber, MD  pyridostigmine (MESTINON) 60 MG tablet Take 1.5 tablets (90 mg total) by mouth every 3 (three) hours. 12am,3am,6am,9am,12pm,3pm,6pm,9pm 07/11/20  Yes Westley Chandler, MD  vitamin B-12 (CYANOCOBALAMIN) 1000 MCG tablet Take 1,000 mcg by mouth daily. 6 or 9pm   Yes [provider]  Vitamin D, Ergocalciferol, (DRISDOL) 1.25 MG (50000 UNIT) CAPS capsule Take 50,000 Units by mouth 2 (two) times a week.   Yes [provider]  Monte Fantasia INHUB 250-50 MCG/ACT AEPB Inhale 1 puff into the lungs 2 (two) times daily. 6am and 6pm 10/09/20  Yes [provider]  zafirlukast (ACCOLATE) 20 MG tablet Take 1 tablet (20 mg total) by mouth 2 (two) times daily before a meal. 6am 6pm 07/11/20  Yes Westley Chandler, MD     Family History  Adopted: Yes    Social  History   Socioeconomic History  . Marital status: Single    Spouse name: Not on file  . Number of children: Not on file  . Years of education: Not on file  . Highest education level: Not on file  Occupational History  . Not on file  Tobacco Use  . Smoking status: Former Games developer  . Smokeless tobacco: Never Used  Substance and Sexual Activity  . Alcohol use: Not on file  . Drug use: Not on file  . Sexual activity: Not on file  Other Topics Concern  . Not on file  Social History Narrative  . Not on file   Social Determinants of Health   Financial Resource Strain: Not on file  Food Insecurity: Not on file  Transportation Needs: Not on file  Physical Activity: Not on file  Stress: Not on file  Social Connections: Not on file    Review of Systems: A 12 point ROS discussed and pertinent positives are indicated in the HPI above.  All other systems are negative.  Review of Systems  Respiratory: Positive for cough and shortness of breath.   Gastrointestinal: Positive for abdominal pain, diarrhea and nausea.  Neurological: Positive for weakness.  Psychiatric/Behavioral: Negative for behavioral problems and confusion.    Vital Signs: BP 110/64 (BP Location: Right Arm)   Pulse 62   Temp 97.8 F (36.6 C) (Oral)   Resp 20   Wt 108 lb 0.4 oz (49 kg)   SpO2 99%   BMI 18.54 kg/m   Physical Exam Vitals reviewed.  Constitutional:      Appearance: She is ill-appearing.  Cardiovascular:     Rate and Rhythm: Normal rate and regular rhythm.  Pulmonary:     Effort: Pulmonary effort is normal.     Breath sounds: No wheezing, rhonchi or rales.  Musculoskeletal:        General: Normal range of motion.  Skin:    General: Skin is warm.  Neurological:     Mental Status: She is alert and oriented to person, place, and time.  Psychiatric:        Behavior: Behavior normal.     Imaging: DG Abd Acute W/Chest  Result Date: 10/27/2020 CLINICAL DATA:  Sepsis like presentation.  Myasthenia gravis and dysautonomia. EXAM: DG ABDOMEN ACUTE WITH 1 VIEW CHEST COMPARISON:  Chest x-ray 05/28/2020, CT abdomen pelvis 07/09/2020 FINDINGS: Accessed right chest wall Port-A-Cath with tip overlying the superior cavoatrial junction. Interval removal of a left chest wall dialysis catheter. Likely gastrojejunostomy tube overlies the left mid abdomen. The heart size and mediastinal contours are within normal limits. Interval development of right lower lobe opacity as well as new retrocardiac opacity. No pulmonary edema. No pleural effusion. No pneumothorax. There is no evidence of dilated bowel loops or free intraperitoneal air. No radiopaque calculi or other significant radiographic abnormality is seen. No acute osseous abnormality. IMPRESSION: 1. Development bilateral lower lung zone airspace opacities suggestive of infection or inflammation. Followup PA and lateral chest X-ray is recommended in 3-4 weeks following therapy to ensure resolution and exclude underlying malignancy. 2. Nonobstructive bowel gas pattern. Electronically Signed   By: Tish FredericksonMorgane  Naveau M.D.   On: 10/27/2020 01:28    Labs:  CBC: Recent Labs    10/30/20 0455 10/31/20 0428 11/01/20 0403 11/02/20 0323  WBC 7.8 8.8 6.9 10.0  HGB 8.4* 8.7* 8.4* 8.7*  HCT 26.9* 27.4* 25.9* 27.5*  PLT 284 322 269 328    COAGS: Recent Labs    05/28/20 0215 10/27/20 0011  INR 1.8* 1.0  APTT 43* 31    BMP: Recent Labs    10/30/20 0455 10/31/20 0428 11/01/20 0403 11/02/20 0323  NA 137 136 137 137  K 3.2* 2.8* 3.3* 3.9  CL 106 101 102 104  CO2 25 25 28 27   GLUCOSE 80 100* 85 101*  BUN 9 6 <5* <5*  CALCIUM 7.5* 7.6* 7.5* 7.8*  CREATININE 0.55 0.49 0.42* 0.50  GFRNONAA >60 >60 >60 >60    LIVER FUNCTION TESTS: Recent Labs    06/03/20 1521 07/09/20 2132 10/27/20 0011 10/28/20 0255  BILITOT 1.2 0.8 2.1* 2.4*  AST 17 14* 56* 33  ALT 19 15 15 14   ALKPHOS 40 56 45 37*  PROT 5.2* 5.4* 5.2* 4.6*  ALBUMIN 3.7 3.1* 1.6*  1.3*    TUMOR MARKERS: No results for input(s): AFPTM, CEA, CA199, CHROMGRNA in the last 8760 hours.  Assessment and Plan:  Myasthenia gravis- flare Scheduled for tunneled plasma pheresis catheter placement Risks and benefits discussed with the patient including, but not limited to bleeding, infection, vascular injury, pneumothorax which may require chest tube placement, air embolism or even death  All of the patient's questions were answered, patient is agreeable to proceed. Consent signed and in chart.   Thank you for this interesting consult.  I greatly enjoyed meeting Brandy LeeSabrina Barbaraann FasterMarie [name] and look forward to participating in their care.  A copy of this report was sent to the requesting provider on this date.  Electronically Signed: Robet LeuPamela A Cheryl Chay, PA-C 11/02/2020, 2:32 PM  I spent a total of 20 Minutes    in face to face in clinical consultation, greater than 50% of which was counseling/coordinating care for tunneled plasma pheresis catheter

## 2020-11-02 NOTE — Progress Notes (Signed)
NIF: -15 VC: 1.58L  With good patient effort.

## 2020-11-02 NOTE — Progress Notes (Signed)
-  [id number] NIF with good effort

## 2020-11-02 NOTE — Progress Notes (Signed)
NT told that pt refused to take her v/s since the NP washed her cloth with downy and she can smell it, pt asked her not to come inside her room

## 2020-11-02 NOTE — Progress Notes (Addendum)
PROGRESS NOTE  Brandy [name] ZOX:[id number] DOB: March 05, 1991 DOA: 10/26/2020 PCP: Pcp, No  HPI/Recap of past 24 hours: HPI from Dr Leafy HalfShalhoub  30 year old female with past medical history of myasthenia gravis, Ehlers-Danlos syndrome, mast cell activation syndrome, POTS, protein calorie malnutrition status post PEG tube placement and dysautonomia who presents with complaints of cough, shortness of breath and fever. Patient is an extremely poor historian. Patient explains that for approximately the past 2 to 3 weeks she has been experiencing productive cough of tan-colored sputum, associated with worsening shortness of breath, pleuritic chest pain, subjective intermittent fevers with generalized weakness. Upon EMS arrival, patient was noted to be "hot to the touch."  EMS reports bodily fluids/excrement and poor living conditions with significant amounts of trash around the living space.  EMS also reports noting that a 29-year-old son is living in these conditions.  Neighbors present on scene reported trying to help take care of the child as well as clean up as much as possible. In the ED, patient has been found to have substantial leukocytosis of 14.9 with chest x-ray revealing developing bilateral basilar infiltrates concerning for pneumonia.  Patient was found to be febrile on arrival with temperature of 101.2 F.  Patient was initiated on intravenous fluids with normal saline and initiated on intravenous antibiotics with intravenous doxycycline and ceftriaxone.  Of note, patient has been hospitalized at [facility] twice since patient's arrival to the [place] area from [place] in late 2021.  During patient's initial presentation, the attending provider and neurology mentioned " that for any further hospitalizations the patient is better served at a tertiary care center with multiple specialties available to help care for this patient as her medical diagnoses are rather complex.  Patient is in  need of allergy, immunology, rheumatology all of which are not offered here [facility]   Because of this, the ER provider did attempt to seek transfer to medical bed in [facility], [facility] and [facility], all of which declined transfer due to lack of medical beds.  The hospitalist group here at [facility] was then contacted to assess the patient for admission to the hospital.     Today, patient denies any new complaints, had an extensive discussion with patient, including answering multiple questions about her overall plan of care.  Patient denies any worsening shortness of breath, worsening cough, chest pain, fever/chills.  Patient allergic to multiple foods served here in the hospital, decision was to bring her as from home.  Discussed with the dietitian to provide fridge/freezer/to store her own food.  Patient will have a HD catheter placed on 11/03/2020 for plasmapheresis.     Assessment/Plan: Principal Problem:   SIRS (systemic inflammatory response syndrome) (HCC) Active Problems:   Severe protein-calorie malnutrition (HCC)   Myasthenia gravis (HCC)   Ehlers-Danlos syndrome   Mast cell activation syndrome (HCC)   POTS (postural orthostatic tachycardia syndrome)   Dysautonomia (HCC)   Pneumonia of both lower lobes due to infectious organism   Pain around PEG tube site   Hyponatremia   History of poor personal hygiene   Fever   Pressure injury of skin   CAP Bilateral lower lobe pneumonia Afebrile, with resolved leukocytosis BC x2 NGTD Chest x-ray showed bilateral lower lung zone airspace opacity suggestive of infection Continue cefepime for a total of 5-7 days, completed doxy Continue supplemental O2  Possible abdominal wall cellulitis surrounding PEG tube site Reports purulent drainage from PEG tube site, with some mild erythema, this is currently improving  Patient also reports PEG tube has not been functioning, IR evaluated, stated PEG tube is functioning and patient just wants it  changed to a specific PEG tube that is not available here at [facility]. Plan for patient to follow up with [facility] as an outpatient Patient able to eat by mouth, but uses PEG tube for supplemental feeding with [place] splash Continue antibiotics as above  ?Possible UTI UA unremarkable UC grew 30,000 Pseudomonas aeruginosa (immunosuppressed) Continue cefepime  Possible myasthenia gravis flare Reported SOB, poor cough as mentioned above Neurology consulted, appreciate rec: Plan to start PLEX treatment,  Recommend elective intubation for respiratory compromise ifVC falls below 15 to 20 mL/kgand or NIFs falls below -20cm/H2O.If poor effort and not sure, can always get ABG to assess for CO2 retention. Elective intubation for airway cmopromise if she has difficulty clearing her secretions Patient will have a HD catheter placed on 11/03/2020 for plasmapheresis Medications that may worsen or trigger MG exacerbation: Class IA antiarrhythmics, magnesium, flouroquinolones, macrolides, aminoglycosides, penicillamine, curare, interferon alpha, botox, quinine. Use with caution:calcium channel blocker,beta blockers andstatins Continue home Mestinon PT/OT  Hypokalemia/hypophosphatemia Replace prn  Anemia of chronic disease Baseline around 9-11 Anemia panel showed iron 25, sats 15, ferritin 206, folate 10.9, vitamin B12 greater than 3000 Daily CBC  Mast cell activation syndrome Continue home prednisone, Dical, Pepcid At high risk for reactions, particularly anaphylactic reactions Close monitoring  Ehlers-Danlos syndrome Outpatient follow-up  Social issues/poor living condition EMS reports human fluids/excrement noted around the house Patient also lives with her 72-year-old son in this poor living condition Social worker/CPS consulted  Severe protein-calorie malnutrition Estimated body mass index is 18.54 kg/m as calculated from the following:   Height as of 07/09/20: 5\' 4"  (1.626 m).   Weight as  of this encounter: 49 kg.  Dietitian consulted     Code Status: Full  Family Communication: None at bedside  Disposition Plan: Status is: Inpatient  Remains inpatient appropriate because:Inpatient level of care appropriate due to severity of illness   Dispo: The patient is from: Home              Anticipated d/c is to: Home Vs SNF (refused)              Patient currently is not medically stable to d/c.   Difficult to place patient No   Consultants:  Neurology  IR  Procedures:  None  Antimicrobials:  Cefepime  DVT prophylaxis: Lovenox   Objective: Vitals:   11/02/20 0623 11/02/20 0916 11/02/20 1012 11/02/20 1411  BP: 110/78  110/64   Pulse: 60  62   Resp: 14  20   Temp: 98.3 F (36.8 C)  97.8 F (36.6 C)   TempSrc: Oral  Oral   SpO2: 100% 100% 99% 99%  Weight:        Intake/Output Summary (Last 24 hours) at 11/02/2020 1547 Last data filed at 11/02/2020 0800 Gross per 24 hour  Intake 500 ml  Output 1500 ml  Net -1000 ml   Filed Weights   10/28/20 1400  Weight: 49 kg    Exam:  General: NAD, cachectic, frail-appearing, thin  Cardiovascular: S1, S2 present  Respiratory: CTAB  Abdomen: Soft, nontender, nondistended, bowel sounds present, G-J tube noted  Musculoskeletal: No bilateral pedal edema noted  Skin: Normal  Psychiatry: Teary mood   Data Reviewed: CBC: Recent Labs  Lab 10/29/20 0349 10/30/20 0455 10/31/20 0428 11/01/20 0403 11/02/20 0323  WBC 11.5* 7.8 8.8 6.9 10.0  NEUTROABS 9.6* 7.6 8.4*  5.0 7.4  HGB 8.7* 8.4* 8.7* 8.4* 8.7*  HCT 27.8* 26.9* 27.4* 25.9* 27.5*  MCV 83.5 82.8 82.5 82.2 82.8  PLT 269 284 322 269 328   Basic Metabolic Panel: Recent Labs  Lab 10/27/20 0011 10/28/20 0255 10/29/20 0349 10/30/20 0455 10/31/20 0423 10/31/20 0428 11/01/20 0403 11/02/20 0323  NA 127*   < > 135 137  --  136 137 137  K 3.1*   < > 3.6 3.2*  --  2.8* 3.3* 3.9  CL 86*   < > 101 106  --  101 102 104  CO2 26   < > 24 25   --  25 28 27   GLUCOSE 87   < > 93 80  --  100* 85 101*  BUN 22*   < > 15 9  --  6 <5* <5*  CREATININE 1.03*   < > 0.66 0.55  --  0.49 0.42* 0.50  CALCIUM 7.2*   < > 7.5* 7.5*  --  7.6* 7.5* 7.8*  MG 2.2  --   --  1.8 1.8  --   --   --   PHOS  --   --   --   --  1.2*  --   --  1.9*   < > = values in this interval not displayed.   GFR: Estimated Creatinine Clearance: 80.3 mL/min (by C-G formula based on SCr of 0.5 mg/dL). Liver Function Tests: Recent Labs  Lab 10/27/20 0011 10/28/20 0255  AST 56* 33  ALT 15 14  ALKPHOS 45 37*  BILITOT 2.1* 2.4*  PROT 5.2* 4.6*  ALBUMIN 1.6* 1.3*   No results for input(s): LIPASE, AMYLASE in the last 168 hours. No results for input(s): AMMONIA in the last 168 hours. Coagulation Profile: Recent Labs  Lab 10/27/20 0011  INR 1.0   Cardiac Enzymes: No results for input(s): CKTOTAL, CKMB, CKMBINDEX, TROPONINI in the last 168 hours. BNP (last 3 results) No results for input(s): PROBNP in the last 8760 hours. HbA1C: No results for input(s): HGBA1C in the last 72 hours. CBG: No results for input(s): GLUCAP in the last 168 hours. Lipid Profile: No results for input(s): CHOL, HDL, LDLCALC, TRIG, CHOLHDL, LDLDIRECT in the last 72 hours. Thyroid Function Tests: No results for input(s): TSH, T4TOTAL, FREET4, T3FREE, THYROIDAB in the last 72 hours. Anemia Panel: No results for input(s): VITAMINB12, FOLATE, FERRITIN, TIBC, IRON, RETICCTPCT in the last 72 hours. Urine analysis:    Component Value Date/Time   COLORURINE AMBER (A) 10/27/2020 0420   APPEARANCEUR HAZY (A) 10/27/2020 0420   LABSPEC 1.019 10/27/2020 0420   PHURINE 6.0 10/27/2020 0420   GLUCOSEU NEGATIVE 10/27/2020 0420   HGBUR NEGATIVE 10/27/2020 0420   BILIRUBINUR NEGATIVE 10/27/2020 0420   KETONESUR 5 (A) 10/27/2020 0420   PROTEINUR 30 (A) 10/27/2020 0420   NITRITE NEGATIVE 10/27/2020 0420   LEUKOCYTESUR NEGATIVE 10/27/2020 0420   Sepsis  Labs: @LABRCNTIP (procalcitonin:4,lacticidven:4)  ) Recent Results (from the past 240 hour(s))  Resp Panel by RT-PCR (Flu A&B, Covid) Nasopharyngeal Swab     Status: None   Collection Time: 10/27/20 12:11 AM   Specimen: Nasopharyngeal Swab; Nasopharyngeal(NP) swabs in vial transport medium  Result Value Ref Range Status   SARS Coronavirus 2 by RT PCR NEGATIVE NEGATIVE Final    Comment: (NOTE) SARS-CoV-2 target nucleic acids are NOT DETECTED.  The SARS-CoV-2 RNA is generally detectable in upper respiratory specimens during the acute phase of infection. The lowest concentration of SARS-CoV-2 viral copies this  assay can detect is 138 copies/mL. A negative result does not preclude SARS-Cov-2 infection and should not be used as the sole basis for treatment or other patient management decisions. A negative result may occur with  improper specimen collection/handling, submission of specimen other than nasopharyngeal swab, presence of viral mutation(s) within the areas targeted by this assay, and inadequate number of viral copies(<138 copies/mL). A negative result must be combined with clinical observations, patient history, and epidemiological information. The expected result is Negative.  Fact Sheet for Patients:  BloggerCourse.com  Fact Sheet for Healthcare Providers:  SeriousBroker.it  This test is no t yet approved or cleared by the [place] FDA and  has been authorized for detection and/or diagnosis of SARS-CoV-2 by FDA under an Emergency Use Authorization (EUA). This EUA will remain  in effect (meaning this test can be used) for the duration of the COVID-19 declaration under Section 564(b)(1) of the Act, 21 U.S.C.section 360bbb-3(b)(1), unless the authorization is terminated  or revoked sooner.       Influenza A by PCR NEGATIVE NEGATIVE Final   Influenza B by PCR NEGATIVE NEGATIVE Final    Comment: (NOTE) The Xpert  Xpress SARS-CoV-2/FLU/RSV plus assay is intended as an aid in the diagnosis of influenza from Nasopharyngeal swab specimens and should not be used as a sole basis for treatment. Nasal washings and aspirates are unacceptable for Xpert Xpress SARS-CoV-2/FLU/RSV testing.  Fact Sheet for Patients: BloggerCourse.com  Fact Sheet for Healthcare Providers: SeriousBroker.it  This test is not yet approved or cleared by the [place] FDA and has been authorized for detection and/or diagnosis of SARS-CoV-2 by FDA under an Emergency Use Authorization (EUA). This EUA will remain in effect (meaning this test can be used) for the duration of the COVID-19 declaration under Section 564(b)(1) of the Act, 21 U.S.C. section 360bbb-3(b)(1), unless the authorization is terminated or revoked.  Performed at [facility], [address], [place], [place] [address]   Blood Culture (routine x 2)     Status: None   Collection Time: 10/27/20  3:15 AM   Specimen: BLOOD  Result Value Ref Range Status   Specimen Description BLOOD LEFT ARM  Final   Special Requests   Final    BOTTLES DRAWN AEROBIC AND ANAEROBIC Blood Culture results may not be optimal due to an inadequate volume of blood received in culture bottles   Culture   Final    NO GROWTH 5 DAYS Performed at [facility], [address] [place], [place] [address]    Report Status 11/01/2020 FINAL  Final  Urine culture     Status: Abnormal   Collection Time: 10/27/20  4:20 AM   Specimen: In/Out Cath Urine  Result Value Ref Range Status   Specimen Description IN/OUT CATH URINE  Final   Special Requests   Final    NONE Performed at [facility], [address] [place], [place] [address]    Culture 30,000 COLONIES/mL PSEUDOMONAS AERUGINOSA (A)  Final   Report Status 10/29/2020 FINAL  Final   Organism ID, Bacteria PSEUDOMONAS AERUGINOSA (A)  Final      Susceptibility    Pseudomonas aeruginosa - MIC*    CEFTAZIDIME 2 SENSITIVE Sensitive     CIPROFLOXACIN <=0.25 SENSITIVE Sensitive     GENTAMICIN <=1 SENSITIVE Sensitive     IMIPENEM 0.5 SENSITIVE Sensitive     PIP/TAZO <=4 SENSITIVE Sensitive     CEFEPIME 2 SENSITIVE Sensitive     *  30,000 COLONIES/mL PSEUDOMONAS AERUGINOSA  Blood Culture (routine x 2)     Status: None   Collection Time: 10/27/20  9:38 AM   Specimen: BLOOD  Result Value Ref Range Status   Specimen Description BLOOD RIGHT ANTECUBITAL  Final   Special Requests   Final    BOTTLES DRAWN AEROBIC AND ANAEROBIC Blood Culture adequate volume   Culture   Final    NO GROWTH 5 DAYS Performed at [facility], [address], [place], [place] [address]    Report Status 11/01/2020 FINAL  Final      Studies: No results found.  Scheduled Meds: . acetaminophen  1,000 mg Oral Once  . baclofen  10 mg Oral QID  . cromolyn  200 mg Oral TID AC & HS  . cromolyn  20 mg Nebulization TID  . [START ON 11/04/2020] enoxaparin (LOVENOX) injection  40 mg Subcutaneous Q24H  . famotidine  40 mg Oral BID  . fluticasone-salmeterol  1 puff Inhalation BID  . heparin sodium (porcine)  1,000 Units Intracatheter Once  . ipratropium-albuterol  3 mL Inhalation BID  . ivabradine  5 mg Oral BID  . levocetirizine  5 mg Oral QID  . predniSONE  20 mg Oral Daily  . pyridostigmine  90 mg Oral Q3H  . sodium chloride flush  10-40 mL Intracatheter Q12H    Continuous Infusions: . therapeutic plasma exchange solution    . ceFEPime (MAXIPIME) IV 2 g (11/02/20 1246)  . citrate dextrose       LOS: 6 days     Briant Cedar, MD [facility]  If 7PM-7AM, please contact night-coverage [email or web address] 11/02/2020, 3:47 PM

## 2020-11-02 NOTE — Progress Notes (Signed)
RT note. Attempted to have patient do NIF/VC, pt. Refusing at this time. Will ask another RT to get patient to do Nif/VC, RT will continue to monitor.

## 2020-11-02 NOTE — Progress Notes (Signed)
Seen pt initially  During hand over and pt right away asked me to go since she smell cologne, she claimed she sudden eye pain, talked pt of distant and she asked to fine staff to take care of her with no cologne or perfume, told her will discuss with the charge nurse

## 2020-11-03 ENCOUNTER — Inpatient Hospital Stay (HOSPITAL_COMMUNITY): Payer: 59

## 2020-11-03 DIAGNOSIS — Z9189 Other specified personal risk factors, not elsewhere classified: Secondary | ICD-10-CM | POA: Diagnosis not present

## 2020-11-03 DIAGNOSIS — Q796 Ehlers-Danlos syndrome, unspecified: Secondary | ICD-10-CM | POA: Diagnosis not present

## 2020-11-03 DIAGNOSIS — E871 Hypo-osmolality and hyponatremia: Secondary | ICD-10-CM | POA: Diagnosis not present

## 2020-11-03 DIAGNOSIS — J189 Pneumonia, unspecified organism: Secondary | ICD-10-CM | POA: Diagnosis not present

## 2020-11-03 HISTORY — PX: IR US GUIDE VASC ACCESS LEFT: IMG2389

## 2020-11-03 HISTORY — PX: IR FLUORO GUIDE CV LINE LEFT: IMG2282

## 2020-11-03 HISTORY — PX: IR PERC TUN PERIT CATH WO PORT S&I /IMAG: IMG2327

## 2020-11-03 LAB — CBC WITH DIFFERENTIAL/PLATELET
Abs Immature Granulocytes: 1.04 10*3/uL — ABNORMAL HIGH (ref 0.00–0.07)
Basophils Absolute: 0 10*3/uL (ref 0.0–0.1)
Basophils Relative: 0 %
Eosinophils Absolute: 0 10*3/uL (ref 0.0–0.5)
Eosinophils Relative: 0 %
HCT: 26.2 % — ABNORMAL LOW (ref 36.0–46.0)
Hemoglobin: 8.1 g/dL — ABNORMAL LOW (ref 12.0–15.0)
Immature Granulocytes: 13 %
Lymphocytes Relative: 15 %
Lymphs Abs: 1.2 10*3/uL (ref 0.7–4.0)
MCH: 26.2 pg (ref 26.0–34.0)
MCHC: 30.9 g/dL (ref 30.0–36.0)
MCV: 84.8 fL (ref 80.0–100.0)
Monocytes Absolute: 0.4 10*3/uL (ref 0.1–1.0)
Monocytes Relative: 6 %
Neutro Abs: 5.1 10*3/uL (ref 1.7–7.7)
Neutrophils Relative %: 66 %
Platelets: 318 10*3/uL (ref 150–400)
RBC: 3.09 MIL/uL — ABNORMAL LOW (ref 3.87–5.11)
RDW: 15.7 % — ABNORMAL HIGH (ref 11.5–15.5)
WBC: 7.7 10*3/uL (ref 4.0–10.5)
nRBC: 0 % (ref 0.0–0.2)

## 2020-11-03 LAB — BASIC METABOLIC PANEL
Anion gap: 5 (ref 5–15)
BUN: 5 mg/dL — ABNORMAL LOW (ref 6–20)
CO2: 27 mmol/L (ref 22–32)
Calcium: 7.9 mg/dL — ABNORMAL LOW (ref 8.9–10.3)
Chloride: 107 mmol/L (ref 98–111)
Creatinine, Ser: 0.5 mg/dL (ref 0.44–1.00)
GFR, Estimated: >60 mL/min (ref >60)
Glucose, Bld: 77 mg/dL (ref 70–99)
Potassium: 3.6 mmol/L (ref 3.5–5.1)
Sodium: 139 mmol/L (ref 135–145)

## 2020-11-03 MED ORDER — LORAZEPAM 2 MG/ML IJ SOLN
0.5000 mg | Freq: Once | INTRAMUSCULAR | Status: AC
  Administered 2020-11-03: 0.5 mg via INTRAVENOUS
  Filled 2020-11-03: qty 1

## 2020-11-03 MED ORDER — TETRACAINE HCL 1 % IJ SOLN
100.0000 mg | Freq: Once | INTRAMUSCULAR | Status: DC
  Filled 2020-11-03: qty 10

## 2020-11-03 MED ORDER — HEPARIN SODIUM (PORCINE) 1000 UNIT/ML IJ SOLN
INTRAMUSCULAR | Status: AC
  Filled 2020-11-03: qty 1

## 2020-11-03 MED ORDER — DIPHENHYDRAMINE HCL 50 MG/ML IJ SOLN
25.0000 mg | Freq: Three times a day (TID) | INTRAMUSCULAR | Status: DC | PRN
  Administered 2020-11-03 – 2020-11-04 (×2): 25 mg via INTRAVENOUS
  Filled 2020-11-03 (×2): qty 1

## 2020-11-03 MED ORDER — ONDANSETRON HCL 4 MG/2ML IJ SOLN
INTRAMUSCULAR | Status: AC | PRN
  Administered 2020-11-03: 4 mg via INTRAVENOUS

## 2020-11-03 MED ORDER — SODIUM CHLORIDE 0.9 % IV SOLN: INTRAVENOUS | Status: DC

## 2020-11-03 MED ORDER — FENTANYL CITRATE (PF) 100 MCG/2ML IJ SOLN
INTRAMUSCULAR | Status: AC
  Filled 2020-11-03: qty 4

## 2020-11-03 MED ORDER — MIDAZOLAM HCL 2 MG/2ML IJ SOLN
INTRAMUSCULAR | Status: AC
  Filled 2020-11-03: qty 4

## 2020-11-03 MED ORDER — ONDANSETRON HCL 4 MG/2ML IJ SOLN
INTRAMUSCULAR | Status: AC
  Filled 2020-11-03: qty 2

## 2020-11-03 NOTE — Plan of Care (Signed)
Patient got HD catheter today. HD would be able to do Plex for some tomorrow. If she tolerates it well, can get rest of them- as an outpatient. Discussed with Dr. Sharolyn Douglas -- Milon Dikes, MD Neurologist [facility] Pager: [phone]

## 2020-11-03 NOTE — Sedation Documentation (Signed)
Patient spoke with Doctor and Nurse about not needing Fentanyl and Versed only if really neccessary due to previous experience of medication making her feel extremely fatigue and taking a long time to recovery.

## 2020-11-03 NOTE — Progress Notes (Signed)
Transport came to take pt to IR so will do NIF and VC this evening.

## 2020-11-03 NOTE — Progress Notes (Signed)
ANTICOAGULATION CONSULT NOTE   Pharmacy Consult for (Resuming Anticoagulants Post IR Procedure) (Enoxaparin) Indication: VTE prophylaxis  Allergies  Allergen Reactions  . Aspirin   . Nsaids   . Quinolones   . Sulfa Antibiotics   . Telithromycin   . Fludrocortisone   . Ambien [Zolpidem]   . Amitriptyline   . Beef-Potatoes-Spinach [Compleat]   . Botulinum Toxins   . Chlorhexidine Hives  . Ciprofloxacin   . Codeine   . Contrast Media [Iodinated Diagnostic Agents]   . Creon [Pancrelipase (Lip-Prot-Amyl)]   . Cymbalta [Duloxetine Hcl]   . Dextrans   . Ergotamine   . Fluoxetine     All SSRIs All SSRIs   . Gabapentin   . Gallamine   . Hydrocodone   . Iodine   . Lactose Intolerance (Gi)   . Levofloxacin   . Lunesta [Eszopiclone]   . Macrolides And Ketolides   . Maprotiline   . Marplan [Isocarboxazid]     Any MAOI contraindicated  . Midodrine   . Morphine And Related   . Nitrous Oxide   . Oxycodone   . Pethidine [Meperidine]   . Phenergan [Promethazine]   . Phenobarbital   . Pyridium [Phenazopyridine]   . Reglan [Metoclopramide]   . Remeron [Mirtazapine]     Noradrenergic antagonist  . Salicylates   . Savella [Milnacipran]   . Scopolamine   . Sumatriptan   . Tape   . Topiramate   . Tricyclic Antidepressants   . Tums [Calcium Carbonate]     Patient reports allergic.  RN to find out adverse reaction and update allergy field.   . Vancomycin   . Verapamil     Patient Measurements: Weight: 49 kg (108 lb 0.4 oz) Height: 64 inches  Vital Signs: Temp: 97.7 F (36.5 C) (05/17 0815) Temp Source: Oral (05/17 0815) BP: 119/81 (05/17 1525) Pulse Rate: 66 (05/17 1525)  Labs: Recent Labs    11/01/20 0403 11/02/20 0323 11/03/20 0332  HGB 8.4* 8.7* 8.1*  HCT 25.9* 27.5* 26.2*  PLT 269 328 318  CREATININE 0.42* 0.50 0.50    Estimated Creatinine Clearance: 80.3 mL/min (by C-G formula based on SCr of 0.5 mg/dL).   Medical History: Past Medical History:   Diagnosis Date  . Dysautonomia (HCC) 06/28/2020  . Ehlers-Danlos syndrome 06/28/2020  . 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 Myasthenia gravis (HCC)   . POTS (postural orthostatic tachycardia syndrome) 06/28/2020  . Severe protein-calorie malnutrition (HCC) 05/20/2020    Assessment: 30 yr old female with possible myasthenia gravis flare, S/P placement of tunneled HD catheter for plasmapheresis by IR this afternoon. Pharmacy is consulted to resume anticoagulants/antiplatelet medications at the appropriate time post IR procedure, per [facility] protocol (procedure is standard bleeding risk, per consult information).  H/H 8.1/26.2, plt 318; Scr 0.50 (renal function stable)  Pt had order from 5/10-5/16 for enoxaparin 40 mg SQ daily for VTE prophylaxis. Per MAR history, pt/family have refused Lovenox doses.  Goal of Therapy:  VTE prophylaxis Monitor platelets by anticoagulation protocol: Yes   Plan:  Resume enoxaparin 40 mg SQ daily tomorrow AM, 5/18 Monitor CBC, renal function Monitor for bleeding  Vicki Mallet, PharmD, BCPS, [facility] Clinical Pharmacist 11/03/2020,3:59 PM

## 2020-11-03 NOTE — Procedures (Signed)
Pre-procedure Diagnosis: In need of venous access for plasmapheresis  Post-procedure Diagnosis: Same  Successful placement of tunneled HD catheter with tips terminating within the superior aspect of the right atrium.    Complications: None Immediate  EBL: Minimal   The catheter is ready for immediate use.   Katherina Right, MD Pager #: [phone]

## 2020-11-03 NOTE — Progress Notes (Signed)
OT Cancellation Note  Patient Details Name: Brandy [name] MRN: [id number] DOB: 1991-04-20   Cancelled Treatment:    Reason Eval/Treat Not Completed: Patient declined, no reason specified Pt declined OT session this date, stating she is "too overwhelmed" but then followed up with asking "what do I need to do to not refuse you?", educated pt on goal of therapy was to progress to EOB. Pt became visibly upset, tapping her forehead and emotionally labile. Pt encouraged on deep breathing and communicated OT will re-attempt tomorrow. Pt nodded.   While in room, pt requested for new cervical collar, PT communicated this to MD and ortho tech.  [facility] OTR/L Acute Rehabilitation Services Office: [phone]   Rebeca Alert 11/03/2020, 2:12 PM

## 2020-11-03 NOTE — Progress Notes (Signed)
PROGRESS NOTE  Brandy [name] VZS:[id number] DOB: [birth year] DOA: 10/26/2020 PCP: Pcp, No  HPI/Recap of past 24 hours: HPI from Dr Cyd Silence  30 year old female with past medical history of myasthenia gravis, Ehlers-Danlos syndrome, mast cell activation syndrome, POTS, protein calorie malnutrition status post PEG tube placement and dysautonomia who presents with complaints of cough, shortness of breath and fever. Patient is an extremely poor historian. Patient explains that for approximately the past 2 to 3 weeks she has been experiencing productive cough of tan-colored sputum, associated with worsening shortness of breath, pleuritic chest pain, subjective intermittent fevers with generalized weakness. Upon EMS arrival, patient was noted to be "hot to the touch."  EMS reports bodily fluids/excrement and poor living conditions with significant amounts of trash around the living space.  EMS also reports noting that a 75-year-old son is living in these conditions.  Neighbors present on scene reported trying to help take care of the child as well as clean up as much as possible. In the ED, patient has been found to have substantial leukocytosis of 14.9 with chest x-ray revealing developing bilateral basilar infiltrates concerning for pneumonia.  Patient was found to be febrile on arrival with temperature of 101.2 F.  Patient was initiated on intravenous fluids with normal saline and initiated on intravenous antibiotics with intravenous doxycycline and ceftriaxone.  Of note, patient has been hospitalized at [facility] twice since patient's arrival to the [place] area from [place] in late 2021.  During patient's initial presentation, the attending provider and neurology mentioned " that for any further hospitalizations the patient is better served at a tertiary care center with multiple specialties available to help care for this patient as her medical diagnoses are rather complex.  Patient is in  need of allergy, immunology, rheumatology all of which are not offered here [facility])."   Because of this, the ER provider did attempt to seek transfer to medical bed in [facility], [facility] and [facility], all of which declined transfer due to lack of medical beds.  The hospitalist group here at [facility] was then contacted to assess the patient for admission to the hospital. [facility], due to no bed availability, declined transfer. Advised that patient get her inpatient medical care here at [facility] and can be followed as outpt once discharged      Today, patient once again had multiple questions, complaints which were all answered and resolved to the best of my ability. Still reporting generalized weakness, and weak cough. Denies any chest pain, SOB, abdominal pain, N/V, fever/chills     Assessment/Plan: Principal Problem:   SIRS (systemic inflammatory response syndrome) (HCC) Active Problems:   Severe protein-calorie malnutrition (HCC)   Myasthenia gravis (HCC)   Ehlers-Danlos syndrome   Mast cell activation syndrome (HCC)   POTS (postural orthostatic tachycardia syndrome)   Dysautonomia (HCC)   Pneumonia of both lower lobes due to infectious organism   Pain around PEG tube site   Hyponatremia   History of poor personal hygiene   Fever   Pressure injury of skin   CAP Bilateral lower lobe pneumonia Afebrile, with resolved leukocytosis BC x2 NGTD Chest x-ray showed bilateral lower lung zone airspace opacity suggestive of infection Continue cefepime for a total of 7 days, last dose on 11/03/20, completed doxy Continue supplemental O2  Possible abdominal wall cellulitis surrounding PEG tube site Resolved Reports purulent drainage from PEG tube site, with some mild erythema on admission Patient also reports PEG tube has not been functioning, IR  evaluated, stated PEG tube is functioning and patient just wants it changed to a specific PEG tube that is not available here at [facility]. Plan for  patient to follow up with [facility] as an outpatient Patient able to eat by mouth, but uses PEG tube for supplemental feeding with [place] splash  ?Possible UTI UA unremarkable UC grew 30,000 Pseudomonas aeruginosa (immunosuppressed) Completed cefepime  Possible myasthenia gravis flare Reported SOB, poor cough as mentioned above Neurology consulted, appreciate rec: Plan to start PLEX treatment (plasmapheresis) on 11/04/20. Had HD catheter placed on 11/03/20 Recommend elective intubation for respiratory compromise ifVC falls below 15 to 20 mL/kgand or NIFs falls below -20cm/H2O.If poor effort and not sure, can always get ABG to assess for CO2 retention. Elective intubation for airway cmopromise if she has difficulty clearing her secretions Medications that may worsen or trigger MG exacerbation: Class IA antiarrhythmics, magnesium, flouroquinolones, macrolides, aminoglycosides, penicillamine, curare, interferon alpha, botox, quinine. Use with caution:calcium channel blocker,beta blockers andstatins Continue home Mestinon PT/OT  Hypokalemia/hypophosphatemia Replace prn  Anemia of chronic disease Baseline around 9-11 Anemia panel showed iron 25, sats 15, ferritin 206, folate 10.9, vitamin B12 greater than 3000 Daily CBC  Mast cell activation syndrome Continue home prednisone, Dical, Pepcid At high risk for reactions, particularly anaphylactic reactions Close monitoring  Ehlers-Danlos syndrome Outpatient follow-up  Social issues/poor living condition EMS reports human fluids/excrement noted around the house Patient also lives with her 82-year-old son in this poor living condition Social worker/CPS consulted  Severe protein-calorie malnutrition Estimated body mass index is 18.54 kg/m as calculated from the following:   Height as of 07/09/20: _0  (1.626 m).   Weight as of this encounter: 49 kg.  Dietitian consulted- Provided freezer in her room for her special meals as allergic to  multiple food. Working with her to get rice and lentil from our food service. Very limited food choices. Also working to get special feeding supplement for patient. Full note in dietician progress note     Code Status: Full  Family Communication: None at bedside  Disposition Plan: Status is: Inpatient  Remains inpatient appropriate because:Inpatient level of care appropriate due to severity of illness   Dispo: The patient is from: Home              Anticipated d/c is to: Home Vs SNF (refused)              Patient currently is not medically stable to d/c.   Difficult to place patient No   Consultants:  Neurology  IR  Procedures:  HD catheter placement on 11/03/20  Antimicrobials:  Cefepime  DVT prophylaxis: Lovenox refusing SCD   Objective: Vitals:   11/03/20 1334 11/03/20 1505 11/03/20 1510 11/03/20 1525  BP:  127/87 117/81 119/81  Pulse:  67 72 66  Resp:  _1 Temp:      TempSrc:      SpO2: 100% 100% 100% 100%  Weight:        Intake/Output Summary (Last 24 hours) at 11/03/2020 1606 Last data filed at 11/03/2020 1400 Gross per 24 hour  Intake 1157.48 ml  Output 1000 ml  Net 157.48 ml   Filed Weights   10/28/20 1400  Weight: 49 kg    Exam:  General: NAD, cachectic, frail-appearing, thin  Cardiovascular: S1, S2 present  Respiratory: CTAB  Abdomen: Soft, nontender, nondistended, bowel sounds present, G-J tube noted  Musculoskeletal: No bilateral pedal edema noted  Skin: Normal  Psychiatry: Teary mood   Data  Reviewed: CBC: Recent Labs  Lab 10/30/20 0455 10/31/20 0428 11/01/20 0403 11/02/20 0323 11/03/20 0332  WBC 7.8 8.8 6.9 10.0 7.7  NEUTROABS 7.6 8.4* 5.0 7.4 5.1  HGB 8.4* 8.7* 8.4* 8.7* 8.1*  HCT 26.9* 27.4* 25.9* 27.5* 26.2*  MCV 82.8 82.5 82.2 82.8 84.8  PLT 284 322 269 328 974   Basic Metabolic Panel: Recent Labs  Lab 10/30/20 0455 10/31/20 0423 10/31/20 0428 11/01/20 0403 11/02/20 0323 11/03/20 0332  NA 137   --  136 137 137 139  K 3.2*  --  2.8* 3.3* 3.9 3.6  CL 106  --  101 102 104 107  CO2 25  --  _0 GLUCOSE 80  --  100* 85 101* 77  BUN 9  --  6 <5* <5* 5*  CREATININE 0.55  --  0.49 0.42* 0.50 0.50  CALCIUM 7.5*  --  7.6* 7.5* 7.8* 7.9*  MG 1.8 1.8  --   --   --   --   PHOS  --  1.2*  --   --  1.9*  --    GFR: Estimated Creatinine Clearance: 80.3 mL/min (by C-G formula based on SCr of 0.5 mg/dL). Liver Function Tests: Recent Labs  Lab 10/28/20 0255  AST 33  ALT 14  ALKPHOS 37*  BILITOT 2.4*  PROT 4.6*  ALBUMIN 1.3*   No results for input(s): LIPASE, AMYLASE in the last 168 hours. No results for input(s): AMMONIA in the last 168 hours. Coagulation Profile: No results for input(s): INR, PROTIME in the last 168 hours. Cardiac Enzymes: No results for input(s): CKTOTAL, CKMB, CKMBINDEX, TROPONINI in the last 168 hours. BNP (last 3 results) No results for input(s): PROBNP in the last 8760 hours. HbA1C: No results for input(s): HGBA1C in the last 72 hours. CBG: No results for input(s): GLUCAP in the last 168 hours. Lipid Profile: No results for input(s): CHOL, HDL, LDLCALC, TRIG, CHOLHDL, LDLDIRECT in the last 72 hours. Thyroid Function Tests: No results for input(s): TSH, T4TOTAL, FREET4, T3FREE, THYROIDAB in the last 72 hours. Anemia Panel: No results for input(s): VITAMINB12, FOLATE, FERRITIN, TIBC, IRON, RETICCTPCT in the last 72 hours. Urine analysis:    Component Value Date/Time   COLORURINE AMBER (A) 10/27/2020 0420   APPEARANCEUR HAZY (A) 10/27/2020 0420   LABSPEC 1.019 10/27/2020 0420   PHURINE 6.0 10/27/2020 0420   GLUCOSEU NEGATIVE 10/27/2020 0420   HGBUR NEGATIVE 10/27/2020 0420   BILIRUBINUR NEGATIVE 10/27/2020 0420   KETONESUR 5 (A) 10/27/2020 0420   PROTEINUR 30 (A) 10/27/2020 0420   NITRITE NEGATIVE 10/27/2020 0420   LEUKOCYTESUR NEGATIVE 10/27/2020 0420   Sepsis Labs: _1 (procalcitonin:4,lacticidven:4)  ) Recent Results (from the  past 240 hour(s))  Resp Panel by RT-PCR (Flu A&B, Covid) Nasopharyngeal Swab     Status: None   Collection Time: 10/27/20 12:11 AM   Specimen: Nasopharyngeal Swab; Nasopharyngeal(NP) swabs in vial transport medium  Result Value Ref Range Status   SARS Coronavirus 2 by RT PCR NEGATIVE NEGATIVE Final    Comment: (NOTE) SARS-CoV-2 target nucleic acids are NOT DETECTED.  The SARS-CoV-2 RNA is generally detectable in upper respiratory specimens during the acute phase of infection. The lowest concentration of SARS-CoV-2 viral copies this assay can detect is 138 copies/mL. A negative result does not preclude SARS-Cov-2 infection and should not be used as the sole basis for treatment or other patient management decisions. A negative result may occur with  improper specimen collection/handling, submission of specimen other than  nasopharyngeal swab, presence of viral mutation(s) within the areas targeted by this assay, and inadequate number of viral copies(<138 copies/mL). A negative result must be combined with clinical observations, patient history, and epidemiological information. The expected result is Negative.  Fact Sheet for Patients:  EntrepreneurPulse.com.au  Fact Sheet for Healthcare Providers:  IncredibleEmployment.be  This test is no t yet approved or cleared by the [place] FDA and  has been authorized for detection and/or diagnosis of SARS-CoV-2 by FDA under an Emergency Use Authorization (EUA). This EUA will remain  in effect (meaning this test can be used) for the duration of the COVID-19 declaration under Section 564(b)(1) of the Act, 21 U.S.C.section 360bbb-3(b)(1), unless the authorization is terminated  or revoked sooner.       Influenza A by PCR NEGATIVE NEGATIVE Final   Influenza B by PCR NEGATIVE NEGATIVE Final    Comment: (NOTE) The Xpert Xpress SARS-CoV-2/FLU/RSV plus assay is intended as an aid in the diagnosis of  influenza from Nasopharyngeal swab specimens and should not be used as a sole basis for treatment. Nasal washings and aspirates are unacceptable for Xpert Xpress SARS-CoV-2/FLU/RSV testing.  Fact Sheet for Patients: EntrepreneurPulse.com.au  Fact Sheet for Healthcare Providers: IncredibleEmployment.be  This test is not yet approved or cleared by the [place] FDA and has been authorized for detection and/or diagnosis of SARS-CoV-2 by FDA under an Emergency Use Authorization (EUA). This EUA will remain in effect (meaning this test can be used) for the duration of the COVID-19 declaration under Section 564(b)(1) of the Act, 21 U.S.C. section 360bbb-3(b)(1), unless the authorization is terminated or revoked.  Performed at [facility], [place] [address] [place], [place] [address]   Blood Culture (routine x 2)     Status: None   Collection Time: 10/27/20  3:15 AM   Specimen: BLOOD  Result Value Ref Range Status   Specimen Description BLOOD LEFT ARM  Final   Special Requests   Final    BOTTLES DRAWN AEROBIC AND ANAEROBIC Blood Culture results may not be optimal due to an inadequate volume of blood received in culture bottles   Culture   Final    NO GROWTH 5 DAYS Performed at [facility], [place] [address] [place], [place] [address]    Report Status 11/01/2020 FINAL  Final  Urine culture     Status: Abnormal   Collection Time: 10/27/20  4:20 AM   Specimen: In/Out Cath Urine  Result Value Ref Range Status   Specimen Description IN/OUT CATH URINE  Final   Special Requests   Final    NONE Performed at [facility], [place] [address], [place], [place] [address]    Culture 30,000 COLONIES/mL PSEUDOMONAS AERUGINOSA (A)  Final   Report Status 10/29/2020 FINAL  Final   Organism ID, Bacteria PSEUDOMONAS AERUGINOSA (A)  Final      Susceptibility   Pseudomonas aeruginosa - MIC*    CEFTAZIDIME 2 SENSITIVE Sensitive     CIPROFLOXACIN  <=0.25 SENSITIVE Sensitive     GENTAMICIN <=1 SENSITIVE Sensitive     IMIPENEM 0.5 SENSITIVE Sensitive     PIP/TAZO <=4 SENSITIVE Sensitive     CEFEPIME 2 SENSITIVE Sensitive     * 30,000 COLONIES/mL PSEUDOMONAS AERUGINOSA  Blood Culture (routine x 2)     Status: None   Collection Time: 10/27/20  9:38 AM   Specimen: BLOOD  Result Value Ref Range Status   Specimen Description BLOOD RIGHT ANTECUBITAL  Final  Special Requests   Final    BOTTLES DRAWN AEROBIC AND ANAEROBIC Blood Culture adequate volume   Culture   Final    NO GROWTH 5 DAYS Performed at [facility], [place] [address] [place], [place] [address]    Report Status 11/01/2020 FINAL  Final      Studies: IR TUNNELED CENTRAL VENOUS CATHETER PLACEMENT  Result Date: 11/03/2020 INDICATION: History Esther's-Danlos, Mast cell activation syndrome and Myasthenia Gravis, in need of intravenous access for plasmapheresis. EXAM: TUNNELED CENTRAL VENOUS HEMODIALYSIS CATHETER PLACEMENT WITH ULTRASOUND AND FLUOROSCOPIC GUIDANCE MEDICATIONS: The patient is currently admitted to the hospital receiving intravenous antibiotics. The antibiotic was given in an appropriate time interval prior to skin puncture. ANESTHESIA/SEDATION: None, per patient request FLUOROSCOPY TIME:  42 seconds (2 mGy). COMPLICATIONS: None immediate. PROCEDURE: Informed written consent was obtained from the patient after a discussion of the risks, benefits, and alternatives to treatment. Questions regarding the procedure were encouraged and answered. Given the presence of the existing right jugular approach port a catheter decision was made to place a left internal jugular approach dialysis catheter. As such, the left neck and chest were prepped with chlorhexidine in a sterile fashion, and a sterile drape was applied covering the operative field. Maximum barrier sterile technique with sterile gowns and gloves were used for the procedure. A timeout was performed prior to the  initiation of the procedure. After creating a small venotomy incision, a micropuncture kit was utilized to access the internal jugular vein. Real-time ultrasound guidance was utilized for vascular access including the acquisition of a permanent ultrasound image documenting patency of the accessed vessel. The microwire was utilized to measure appropriate catheter length. A stiff Glidewire was advanced to the level of the IVC and the micropuncture sheath was exchanged for a peel-away sheath. A palindrome tunneled hemodialysis catheter measuring 19 cm from tip to cuff was tunneled in a retrograde fashion from the anterior chest wall to the venotomy incision. The catheter was then placed through the peel-away sheath with tips ultimately positioned within the superior aspect of the right atrium. Final catheter positioning was confirmed and documented with a spot radiographic image. The catheter aspirates and flushes normally. The catheter was flushed with appropriate volume heparin dwells. The catheter exit site was secured with a 0-Prolene retention suture. The venotomy incision was closed with Steri-strips. Dressings were applied. The patient tolerated the procedure well without immediate post procedural complication. IMPRESSION: Successful placement of 19 cm tip to cuff tunneled hemodialysis catheter via the left internal jugular vein with tips terminating within the superior aspect of the right atrium. The catheter is ready for immediate use. Electronically Signed   By: Sandi Mariscal M.D.   On: 11/03/2020 15:57    Scheduled Meds: . acetaminophen  1,000 mg Oral Once  . baclofen  10 mg Oral QID  . cromolyn  200 mg Oral TID AC & HS  . cromolyn  20 mg Nebulization TID  . [START ON 11/04/2020] enoxaparin (LOVENOX) injection  40 mg Subcutaneous Q24H  . famotidine  40 mg Oral BID  . fentaNYL      . fluticasone-salmeterol  1 puff Inhalation BID  . heparin sodium (porcine)      . heparin sodium (porcine)  1,000  Units Intracatheter Once  . ipratropium-albuterol  3 mL Inhalation BID  . ivabradine  5 mg Oral BID  . levocetirizine  5 mg Oral QID  . midazolam      . ondansetron      .  predniSONE  20 mg Oral Daily  . pyridostigmine  90 mg Oral Q3H  . sodium chloride flush  10-40 mL Intracatheter Q12H  . tetracaine  100 mg Infiltration Once    Continuous Infusions: . sodium chloride Stopped (11/03/20 1351)  . therapeutic plasma exchange solution    . ceFEPime (MAXIPIME) IV 200 mL/hr at 11/03/20 1400  . citrate dextrose       LOS: 7 days     Alma Friendly, MD [facility]  If 7PM-7AM, please contact night-coverage [email or web address] 11/03/2020, 4:06 PM

## 2020-11-03 NOTE — Progress Notes (Signed)
NIF -20  VC 0.8 Performed with good effort.

## 2020-11-03 NOTE — Sedation Documentation (Signed)
Nausea improved.

## 2020-11-03 NOTE — Progress Notes (Signed)
PT Cancellation Note  Patient Details Name: Loan Oguin MRN: [id number] DOB: 1990/09/15   Cancelled Treatment:    Reason Eval/Treat Not Completed: (P) Patient declined, no reason specified Pt is very anxious waiting for transport for IR procedure. Pt reports she is "too overwhelmed." to participate today. Pt does ask about replacement cervical collar. Pt will follow back with pt for treatment tomorrow. Followed up with MD and Ortho Tech and order for cervical collar has been released and outside vendor has been contacted.   Belmira Daley B. Beverely Risen PT, DPT Acute Rehabilitation Services Pager [phone] Office [phone]   Elon Alas Fleet 11/03/2020, 1:24 PM

## 2020-11-03 NOTE — TOC Progression Note (Signed)
Transition of Care [facility]) - Progression Note    Patient Details  Name: Brandy [name] MRN: [id number] Date of Birth: 25-Feb-1991  Transition of [facility]) CM/SW Contact  Okey Dupre Lazaro Arms, LCSW Phone Number: 11/03/2020, 1:21 PM  Clinical Narrative:  Reviewed patient's chart and visited with [name] to discuss her discharge plan and any HH needs. During chart review, CSW saw that she had [facility] services and they stopped per patient. [name] was sitting up in bed, awake and alert. When asked about her HH services she reported having a new HH agency. When asked the name, [name] requested that CSW return tomorrow as she is going to have a procedure done soon. Patient advised that CSW will return tomorrow.         Expected Discharge Plan and Services  Home with [facility] services                                               Social Determinants of Health (SDOH) Interventions  None needed or requested at this time   Readmission Risk Interventions No flowsheet data found.

## 2020-11-03 NOTE — Progress Notes (Signed)
Patient refuses to wear cardiac monitor stating it makes her break out. Patient also refused for RN to listen to her lungs stating that the doctor already listened to her lungs and that she is having sensory overload.

## 2020-11-03 NOTE — Progress Notes (Signed)
Orthopedic Tech Progress Note Patient Details:  Brandy [name] Nov 02, 1990 [id number] Ordered and dropped off an ASPEN CERVICAL COLLAR.  Patient ID: Brandy [name], female   DOB: 1990-10-22, 30 y.o.   MRN: [id number]   [name] 11/03/2020, 2:35 PM

## 2020-11-03 NOTE — Progress Notes (Signed)
Sent for pt to come to IR for procedure.

## 2020-11-03 NOTE — Sedation Documentation (Signed)
Patient felt hot and nausea requested Zofran. Applied cool wash clothes.

## 2020-11-03 NOTE — Progress Notes (Signed)
Attempted to send for pt to come to IR for procedure. Per transporter, pt states that she cannot come at this time. RN advised that pt needs to be starting plasma exchange in HD by 2pm in order to finish by 6pm. She acknowledged. Will attempt to send for pt again if IR schedule allows.

## 2020-11-03 NOTE — Progress Notes (Signed)
Initial physical assessment not done since nobody can come near with the pt, since pt refused, RT on duty also claimed that pt did not allow her to do breathing treatment since she was also having cologne

## 2020-11-03 NOTE — Sedation Documentation (Signed)
No sedation medication administered. 

## 2020-11-03 NOTE — Progress Notes (Signed)
meds was not able to scan from pt arm band since pt did not allow CN Nikki to get near to her, meds verified by me and CN Nikki before it was given

## 2020-11-03 NOTE — Progress Notes (Signed)
Pharmacy Antibiotic Note  Brandy [name] is a 30 y.o. female admitted on 10/26/2020 with complaints of cough, shortness of breath, and fever.  Pt was initially started on Rocephin and Doxycycline.  Pharmacy has been consulted to dose Cefepime for broader PNA coverage.  Today is day 7 of cefepime.  Plan: Cefepime 2gm IV q8h Follow-up cx data and clinical progress. Follow up total LOT for abx with primary MD Weight: 49 kg (108 lb 0.4 oz)  Temp (24hrs), Avg:97.9 F (36.6 C), Min:97.7 F (36.5 C), Max:98.2 F (36.8 C)  Recent Labs  Lab 10/27/20 0938 10/28/20 0255 10/30/20 0455 10/31/20 0428 11/01/20 0403 11/02/20 0323 11/03/20 0332  WBC  --    < > 7.8 8.8 6.9 10.0 7.7  CREATININE  --    < > 0.55 0.49 0.42* 0.50 0.50  LATICACIDVEN 1.3  --   --   --   --   --   --    < > = values in this interval not displayed.    Estimated Creatinine Clearance: 80.3 mL/min (by C-G formula based on SCr of 0.5 mg/dL).    Allergies  Allergen Reactions  . Aspirin   . Nsaids   . Quinolones   . Sulfa Antibiotics   . Telithromycin   . Fludrocortisone   . Ambien [Zolpidem]   . Amitriptyline   . Beef-Potatoes-Spinach [Compleat]   . Botulinum Toxins   . Chlorhexidine Hives  . Ciprofloxacin   . Codeine   . Contrast Media [Iodinated Diagnostic Agents]   . Creon [Pancrelipase (Lip-Prot-Amyl)]   . Cymbalta [Duloxetine Hcl]   . Dextrans   . Ergotamine   . Fluoxetine     All SSRIs All SSRIs   . Gabapentin   . Gallamine   . Hydrocodone   . Iodine   . Lactose Intolerance (Gi)   . Levofloxacin   . Lunesta [Eszopiclone]   . Macrolides And Ketolides   . Maprotiline   . Marplan [Isocarboxazid]     Any MAOI contraindicated  . Midodrine   . Morphine And Related   . Nitrous Oxide   . Oxycodone   . Pethidine [Meperidine]   . Phenergan [Promethazine]   . Phenobarbital   . Pyridium [Phenazopyridine]   . Reglan [Metoclopramide]   . Remeron [Mirtazapine]     Noradrenergic antagonist  .  Salicylates   . Savella [Milnacipran]   . Scopolamine   . Sumatriptan   . Tape   . Topiramate   . Tricyclic Antidepressants   . Tums [Calcium Carbonate]     Patient reports allergic.  RN to find out adverse reaction and update allergy field.   . Vancomycin   . Verapamil     Antimicrobials this admission: CTX 5/10 >5/11 Cefepime 5/11 >> Doxycycline 5/10 >>5/15  Dose adjustments this admission:   Microbiology results: 5/10 BCx: ngtd 5/10 UCx: 30K PSA   Karyna Bessler A. Jeanella Craze, PharmD, BCPS, FNKF Clinical Pharmacist [place] Please utilize Amion for appropriate phone number to reach the unit pharmacist [facility])    11/03/2020 8:28 AM

## 2020-11-03 NOTE — Progress Notes (Addendum)
NIF performed by pt with good effort -30, pt not dong VC

## 2020-11-04 ENCOUNTER — Encounter (HOSPITAL_COMMUNITY): Payer: Self-pay

## 2020-11-04 DIAGNOSIS — J189 Pneumonia, unspecified organism: Secondary | ICD-10-CM | POA: Diagnosis not present

## 2020-11-04 DIAGNOSIS — Q796 Ehlers-Danlos syndrome, unspecified: Secondary | ICD-10-CM | POA: Diagnosis not present

## 2020-11-04 LAB — BASIC METABOLIC PANEL
Anion gap: 6 (ref 5–15)
BUN: 7 mg/dL (ref 6–20)
CO2: 26 mmol/L (ref 22–32)
Calcium: 8.5 mg/dL — ABNORMAL LOW (ref 8.9–10.3)
Chloride: 106 mmol/L (ref 98–111)
Creatinine, Ser: 0.59 mg/dL (ref 0.44–1.00)
GFR, Estimated: >60 mL/min (ref >60)
Glucose, Bld: 80 mg/dL (ref 70–99)
Potassium: 3.7 mmol/L (ref 3.5–5.1)
Sodium: 138 mmol/L (ref 135–145)

## 2020-11-04 LAB — CBC WITH DIFFERENTIAL/PLATELET
Abs Immature Granulocytes: 0 10*3/uL (ref 0.00–0.07)
Basophils Absolute: 0 10*3/uL (ref 0.0–0.1)
Basophils Relative: 0 %
Eosinophils Absolute: 0 10*3/uL (ref 0.0–0.5)
Eosinophils Relative: 0 %
HCT: 28.2 % — ABNORMAL LOW (ref 36.0–46.0)
Hemoglobin: 8.7 g/dL — ABNORMAL LOW (ref 12.0–15.0)
Lymphocytes Relative: 5 %
Lymphs Abs: 0.6 10*3/uL — ABNORMAL LOW (ref 0.7–4.0)
MCH: 26.4 pg (ref 26.0–34.0)
MCHC: 30.9 g/dL (ref 30.0–36.0)
MCV: 85.7 fL (ref 80.0–100.0)
Monocytes Absolute: 0 10*3/uL — ABNORMAL LOW (ref 0.1–1.0)
Monocytes Relative: 0 %
Neutro Abs: 11.1 10*3/uL — ABNORMAL HIGH (ref 1.7–7.7)
Neutrophils Relative %: 95 %
Platelets: 326 10*3/uL (ref 150–400)
RBC: 3.29 MIL/uL — ABNORMAL LOW (ref 3.87–5.11)
RDW: 16.1 % — ABNORMAL HIGH (ref 11.5–15.5)
WBC: 11.7 10*3/uL — ABNORMAL HIGH (ref 4.0–10.5)
nRBC: 0 % (ref 0.0–0.2)
nRBC: 1 /100 WBC — ABNORMAL HIGH

## 2020-11-04 LAB — GLUCOSE, CAPILLARY: Glucose-Capillary: 81 mg/dL (ref 70–99)

## 2020-11-04 LAB — FOLATE: Folate: 7.8 ng/mL (ref >5.9)

## 2020-11-04 MED ORDER — ACD FORMULA A 0.73-2.45-2.2 GM/100ML VI SOLN
1000.0000 mL | Status: DC
  Filled 2020-11-04: qty 1000

## 2020-11-04 MED ORDER — IPRATROPIUM-ALBUTEROL 0.5-2.5 (3) MG/3ML IN SOLN
3.0000 mL | Freq: Three times a day (TID) | RESPIRATORY_TRACT | Status: DC
  Administered 2020-11-04 – 2020-11-06 (×6): 3 mL via RESPIRATORY_TRACT
  Filled 2020-11-04 (×6): qty 3

## 2020-11-04 MED ORDER — ACETAMINOPHEN 325 MG PO TABS: 650.0000 mg | ORAL_TABLET | ORAL | Status: DC | PRN

## 2020-11-04 MED ORDER — DIPHENHYDRAMINE HCL 25 MG PO CAPS: 25.0000 mg | ORAL_CAPSULE | Freq: Four times a day (QID) | ORAL | Status: DC | PRN

## 2020-11-04 MED ORDER — DIPHENHYDRAMINE HCL 50 MG/ML IJ SOLN: 25.0000 mg | Freq: Three times a day (TID) | INTRAMUSCULAR | Status: DC | PRN

## 2020-11-04 MED ORDER — ACETAMINOPHEN 325 MG PO TABS
ORAL_TABLET | ORAL | Status: AC
  Filled 2020-11-04: qty 2

## 2020-11-04 MED ORDER — CALCIUM CARBONATE ANTACID 500 MG PO CHEW
2.0000 | CHEWABLE_TABLET | ORAL | Status: AC
Start: 2020-11-04 — End: 2020-11-04
  Filled 2020-11-04: qty 2

## 2020-11-04 MED ORDER — ALBUMIN HUMAN 25 % IV SOLN
INTRAVENOUS | Status: AC
  Filled 2020-11-04 (×3): qty 200

## 2020-11-04 MED ORDER — ACD FORMULA A 0.73-2.45-2.2 GM/100ML VI SOLN
Status: AC
  Administered 2020-11-04: 1000 mL
  Filled 2020-11-04: qty 500

## 2020-11-04 MED ORDER — CALCIUM GLUCONATE-NACL 2-0.675 GM/100ML-% IV SOLN
INTRAVENOUS | Status: AC
  Administered 2020-11-04: 2000 mg via INTRAVENOUS
  Filled 2020-11-04: qty 100

## 2020-11-04 MED ORDER — ANTICOAGULANT SODIUM CITRATE 4% (200MG/5ML) IV SOLN
5.0000 mL | Freq: Once | Status: AC
  Administered 2020-11-04: 5 mL
  Filled 2020-11-04: qty 5

## 2020-11-04 MED ORDER — CALCIUM GLUCONATE-NACL 2-0.675 GM/100ML-% IV SOLN
2.0000 g | Freq: Once | INTRAVENOUS | Status: AC
  Filled 2020-11-04: qty 100

## 2020-11-04 NOTE — Progress Notes (Addendum)
Occupational Therapy Treatment Patient Details Name: Brandy [name] MRN: [id number] DOB: [birth year] Today's Date: 11/04/2020    History of present illness 30 year old female with past medical history of myasthenia gravis, Ehlers-Danlos syndrome, mast cell activation syndrome, POTS, protein calorie malnutrition status post PEG tube placement and dysautonomia who presents to [facility] emergency department with complaints of cough shortness of breath and fever. Found to have Pneumonia of the bilateral lower lobes due to infectious organism and SIRS   OT comments  Pt received in bed. Pt's cervical collar was delivered, however pt frustrated and stated it was the wrong cervical collar. Pt agreeable to bed level session. She required setup assistance for oral care and upper body washup. Pt continues to demonstrate significant self-limiting behaviors. She initially stated "what do you want to see me do today", when therapist prompted to sit EOB, pt became emotionally labile and stated she "does not want to lose any weight" and "I'm afraid I haven't gotten enough calories while being in the hospital". After education and encouragement to continue to mobilize, pt progressed to stating "just sitting upright in the bed is hurting my neck". She declined use of the cervical collar secondary to pain near HD port.   Pt is significantly anxious and with self-limiting behaviors which impact her independence with self-care and functional mobility to be able to complete grooming at sink level or ambulate to the bathroom for toileting. Pt may benefit from psych referral or palliative care to assist with addressing pt's psycho-emotional status to allow pt to progress with independence with self-care, IADL and functional mobility to allow safe d/c. Pt has a 29 y.o son at home and reports "I push myself past what I am capable of and it leaves no time for self-care". Pt is adamantly declining SNF and reports she is  discharging home at the end of the week. Will continue to follow acutely.    Follow Up Recommendations  SNF;Supervision/Assistance - 24 hour    Equipment Recommendations  None recommended by OT    Recommendations for Other Services      Precautions / Restrictions Precautions Precautions: Fall Restrictions Weight Bearing Restrictions: No       Mobility Bed Mobility Overal bed mobility: Modified Independent             General bed mobility comments: using bed functions to assist with positional changes, but this therapist did see pt progress self upright in the bed    Transfers                 General transfer comment: pt declined    Balance Overall balance assessment:  (unable to assess)                                         ADL either performed or assessed with clinical judgement   ADL Overall ADL's : Needs assistance/impaired Eating/Feeding: Independent;Bed level   Grooming: Set up;Bed level Grooming Details (indicate cue type and reason): burshed teeth and washed face Upper Body Bathing: Set up;Bed level Upper Body Bathing Details (indicate cue type and reason): washed underarms                           General ADL Comments: pt significantly limited by self-limiting behaviors, she was agreeable to bed level session only     Vision  Perception     Praxis      Cognition Arousal/Alertness: Awake/alert Behavior During Therapy: Anxious Overall Cognitive Status: Impaired/Different from baseline Area of Impairment: Awareness;Problem solving;Safety/judgement                         Safety/Judgement: Decreased awareness of deficits (son in home)   Problem Solving: Decreased initiation General Comments: Pt with strong need to control her environment, she demonstrates significant self-limiting behaviors stating "I'll do whatever you want me to do" then when prompted to sit EOB pt states various reasons  why she is unable to progress to EOB at this time. Pt was agreeable to bed level ADL tasks this date.        Exercises     Shoulder Instructions       General Comments vss on 2lnc    Pertinent Vitals/ Pain       Pain Assessment: Faces Faces Pain Scale: Hurts little more Pain Location: generalized Pain Descriptors / Indicators: Grimacing;Moaning Pain Intervention(s): Limited activity within patient's tolerance;Monitored during session  Home Living                                          Prior Functioning/Environment              Frequency  Min 2X/week        Progress Toward Goals  OT Goals(current goals can now be found in the care plan section)  Progress towards OT goals: Not progressing toward goals - comment (continues to demonstrate self-limiting behaviors)  Acute Rehab OT Goals Patient Stated Goal: go home with HHPT/HHPT and HHAide OT Goal Formulation: With patient Time For Goal Achievement: 11/13/20 Potential to Achieve Goals: Fair ADL Goals Pt Will Perform Grooming: with set-up;with supervision;sitting (EOB) Pt Will Perform Upper Body Bathing: with set-up;with supervision;sitting (EOB) Pt Will Perform Lower Body Bathing: with set-up;with supervision;sitting/lateral leans (EOB, or sit<>stand) Pt Will Perform Upper Body Dressing: with set-up;with supervision;sitting (EOB) Pt Will Perform Lower Body Dressing: with set-up;with supervision;sitting/lateral leans (EOB or sit<>stand) Pt Will Transfer to Toilet: with supervision;squat pivot transfer;stand pivot transfer;bedside commode (or transfer board) Pt Will Perform Toileting - Clothing Manipulation and hygiene: sitting/lateral leans Additional ADL Goal #1: Pt will be S in and OOB for basic ADLs  Plan Discharge plan remains appropriate    Co-evaluation                 AM-PAC OT "6 Clicks" Daily Activity     Outcome Measure   Help from another person eating meals?: None Help  from another person taking care of personal grooming?: A Little Help from another person toileting, which includes using toliet, bedpan, or urinal?: A Lot Help from another person bathing (including washing, rinsing, drying)?: A Lot Help from another person to put on and taking off regular upper body clothing?: A Little Help from another person to put on and taking off regular lower body clothing?: A Lot 6 Click Score: 16    End of Session Equipment Utilized During Treatment: Oxygen  OT Visit Diagnosis: Other abnormalities of gait and mobility (R26.89);Muscle weakness (generalized) (M62.81);Pain Pain - part of body:  (generalized)   Activity Tolerance Patient tolerated treatment well   Patient Left in bed;with call bell/phone within reach;with bed alarm set   Nurse Communication Mobility status        Time:  1749-4496 OT Time Calculation (min): 24 min  Charges: OT General Charges $OT Visit: 1 Visit OT Treatments $Self Care/Home Management : 23-37 mins  Rosey Bath OTR/L Acute Rehabilitation Services Office: [phone]    Rebeca Alert 11/04/2020, 4:27 PM

## 2020-11-04 NOTE — Progress Notes (Signed)
Nutrition Follow-up  DOCUMENTATION CODES:  Severe malnutrition in context of chronic illness  INTERVENTION:  Will trial boluses of Neocate Splash (pt is supposed to have some brought in from home). If pt is able to tolerate, recommend increasing boluses as able. Note, this should not be intended to be the sole source of pt's nutrition given each bottle provides only 237 kcals and 7g protein. If pt were able to tolerate 8 bottles per day, it would provide 1896 kcals (meets 100% kcal needs) and 56g protein (meets 65% protein needs).   Will continue to provide pt with rice/lentils from kitchen and any approved snacks able to be procured. items are to be kept in hospital approved fridge/freezer placed in pt's room   If pt is unable to tolerate Neocate, recommend continuing to trial tube feeding products to find most tolerated formula. Please note that it is unlikely that pt will have no symptoms due to Mast Cell. Instead, primary focus should be on identifying the formula that causes the least severe reactions. If pt is unable to tolerate EN options, recommend trial of TPN, though it is noted that pts with Mast Cell may have tolerance issues with this as well and regimen will need to be carefully monitored/adjusted to provide pt with most tolerable regimen possible.   NUTRITION DIAGNOSIS:  Severe Malnutrition related to chronic illness (mast cell activation disorder) as evidenced by energy intake < or equal to 75% for > or equal to 1 month,severe muscle depletion,severe fat depletion. - updated  GOAL:  Other (Comment) (provide pt with plan to help meet nutrition needs) - not met  MONITOR:  Weight trends,Labs,I & O's  REASON FOR ASSESSMENT:  Consult Assessment of nutrition requirement/status  ASSESSMENT:  Pt with a PMH including myasthenia gravis, Ehlers-Danlos syndrome, mast cell activation syndrome, POTS, protein calorie malnutrition s/p PEG tube placement and dysautonomia admitted with CAP  and possible abdominal wall cellulitis surrounding PEG tube site.  Pt has been unable to transfer to [facility] due to lack of bed availability, so arrangements were made to have hospital approved fridge/freezer placed in the pt's room so that she can have her specialized food items stored appropriately to minimize histamine production. Arrangements were also made for the kitchen staff to provide pt with lentils, rice, and any allergen-friendly snacks they are able to procure. Unfortunately, kitchen staff will be unable to provide pt with any other items due to her dietary restrictions and limited staffing.   Discussed tube feeding with pt in detail. Pt has tried all TF available on hospital formulary at present, including [employer] [place], and has had severe tolerance issues. Since last admission, pt has been able to occasionally use Neocate Splash, but reports this caused her PO intake to drop significantly even though she was not even finishing 1 can of the product per day. Pt reports no anaphylactic symptoms 2/2 Neocate Splash use at this time, so pt is willing to trial use of this product again. Pt has some product leftover at home that she has asked a friend to bring in to begin trials while RD places order for additional Neocate Splash to be delivered given it is not typically carried on [place]. Currently awaiting response from Pharmacy to confirm ability to order Neocate Splash and/or placement of order.   Please note that pts with Mast Cell are unlikely to find an enteral feeding formula that does not cause any reaction and the primary goal should be finding a formula that causes  the least severe reaction(s). If pt is unable to find an appropriate formula, recommend trial of TPN, though this will need careful observation/management as Mast Cell pts often have tolerance issues with various components of TPN as well.  Discussed with pt who is agreeable to plan of Neocate trials. Pt also  agreeable to TPN trial if needed. RD discussed with MD in detail. Although it is not ideal to provide TPN to a pt with a functioning gut, concerned pt will not be able to meet her needs with PO intake/EN given extremely limited diet and pt is already malnourished upon admission. Recommend pt have continued dietitian follow-up after discharge to aid in development/progression of nutrition plan of care.  UOP: 1579m x24 hours  Medications: tums, gastrocrom, pepcid, deltasone  Labs reviewed. Vitamin/MIneral Profile:  Thiamine B1: pending Vitamin B12: 3093 (H) Folate B9: pending Vitamin B6: pending Vitamin A: pending Vitamin D: 73.34 (WNL) Vitamin C: pending Copper: pending Selenium: pending Zinc: pending  Iron: 25 (L)  NUTRITION - FOCUSED PHYSICAL EXAM: Flowsheet Row Most Recent Value  Orbital Region Mild depletion  Upper Arm Region Severe depletion  Thoracic and Lumbar Region Severe depletion  Buccal Region Mild depletion  Temple Region Moderate depletion  Clavicle Bone Region Moderate depletion  Clavicle and Acromion Bone Region Moderate depletion  Scapular Bone Region Moderate depletion  Dorsal Hand Moderate depletion  Patellar Region Severe depletion  Anterior Thigh Region Severe depletion  Posterior Calf Region Severe depletion  Edema (RD Assessment) None  Hair Reviewed  Eyes Reviewed  Mouth Reviewed  Skin Reviewed  Nails Reviewed      Diet Order:   Diet Order            Diet NPO time specified Except for: Sips with Meds  Diet effective midnight                EDUCATION NEEDS:  No education needs have been identified at this time  Skin:  Skin Assessment: Reviewed RN Assessment  Last BM:  5/16  Height:  Ht Readings from Last 1 Encounters:  11/03/20 _0  (1.626 m)   Weight:  Wt Readings from Last 1 Encounters:  10/28/20 49 kg   BMI:  Body mass index is 18.54 kg/m.  Estimated Nutritional Needs:  Kcal:  11610-[address]604Protein:  85-95g Fluid:   >1.75L/d    ALarkin Ina MS, RD, LDN RD pager number and weekend/on-call pager number located in [place]

## 2020-11-04 NOTE — Progress Notes (Signed)
Patient requested to speak with MD regarding her plan of care and MD has been notified, RN will continue to monitor this patient.

## 2020-11-04 NOTE — Progress Notes (Signed)
RT NOTE: Patient performed NIF/VC with moderate effort with following results:  NIF: -22 VC: 1.9L  Vitals are stable. RT will continue to monitor.

## 2020-11-04 NOTE — Progress Notes (Signed)
RT NOTE: Patient performed NIF with good effort with result of -20. VC not done at this time as patient says needs to take her cromolyn and eat her lunch. Vitals are stable. RT will continue to monitor.

## 2020-11-04 NOTE — Progress Notes (Signed)
PT Cancellation Note  Patient Details Name: Brandy [name] MRN: [id number] DOB: 1991/02/05   Cancelled Treatment:    Reason Eval/Treat Not Completed: Patient at procedure or test/unavailable   Currently receiving Plasmapheresis;   Will follow up later today as time allows;  Otherwise, will follow up for PT tomorrow;   Thank you,  Brandy [name], PT  Acute Rehabilitation Services Pager [phone] Office [phone]     Levi Aland 11/04/2020, 10:33 AM

## 2020-11-04 NOTE — Progress Notes (Signed)
Spoke with the primary hospitalist.  Patient tolerated her first plasma exchange well today.  From a neurological standpoint, she can complete her plasma exchange course- 1 exchange every other day for a total of 5 exchanges as an outpatient. At or after the last plasma exchange, she should get her HD catheter removed by IR. She should follow-up with her outpatient neurologist as already planned in June. Inpatient neurology service will be peripherally following and will be available anytime needed. Please call with questions. Plan was discussed with Dr. David Stall  -- Milon Dikes, MD Neurologist [facility] Pager: [phone]  No charge note

## 2020-11-04 NOTE — Progress Notes (Signed)
[facility] PROGRESS NOTE    Progress Note  Arilyn Brierley  VZC:[id number] DOB: 1990/10/20 DOA: 10/26/2020 PCP: Merryl Hacker, No     Brief Narrative:   Olympia Adelsberger is an 30 y.o. 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  He relates that the symptoms started about 2 weeks prior to admission.  Diagnosed with bilateral lower lobe pneumonia and abdominal wall cellulitis surrounding the PEG tube site    Assessment/Plan:   Bilateral lower lobe pneumonia: Blood cultures have remained negative till date she has completed her course of IV antibiotics in house. Currently on room air.  Possible abdominal wall cellulitis around the PEG tube site: Now resolved. She reported purulent drainage from her PEG tube with erythema on admission.  Was consulted and her PEG tube.  The patient wanted her PEG tube change but that specific PEG tube she is desiring is not available here at home plan for outpatient [facility] follow-up for PEG tube exchange. Patient is tolerating her diet by mouth.  Possible UTI: She completed her course of cefepime, urine culture grew 30,000 colonies Lena Pseudomonas aeruginosa (patient is immunosuppressed).  Possible myasthenia gravis flare: Neurology was consulted they recommended Plex treatment on 11/04/2020, she had a catheter placed on 11/03/2020. Continue Mestinon and physical therapy. Neurology related that if she tolerates her HD she can be discharged in finish her further Plex treatment as an outpatient.  Hypokalemia/hypophosphatemia: Monitor regularly and replete as needed.  Anemia of chronic disease: With a baseline creatinine of around 9-11, check CBCs regularly.  Mast cell activation syndrome: Continue home prednisone, did call in Pepcid. With this condition she  is at high risk of anaphylaxis reaction we will continue to monitor saturations closely.  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  Severe protein caloric malnutrition: Dietitian has been consulted, they have provided her freezer in her room due to multiple allergies, we are working getting rice and lentils from  food services.  DVT prophylaxis: lovenxo Family Communication:none Status is: Inpatient  Remains inpatient appropriate because:Hemodynamically unstable   Dispo: The patient is from: Home              Anticipated d/c is to: Home              Patient currently is not medically stable to d/c.   Difficult to place patient No        Code Status:     Code Status Orders  (From admission, onward)         Start     Ordered   10/27/20 0612  Full code  Continuous        10/27/20 0611        Code Status History    Date Active Date Inactive Code Status Order ID Comments User Context   07/10/2020 0118 07/12/2020 0506 Full Code [id number]  Shela Leff, MD ED   05/16/2020 1012 05/22/2020 0604 Full Code [id number]  Candee Furbish, MD ED   Advance Care Planning Activity        IV Access:    Peripheral IV   Procedures and diagnostic studies:   IR US Guide Vasc Access Left  Result Date: 11/03/2020 INDICATION: History Esther's-Danlos, Mast cell activation syndrome and Myasthenia Gravis, in need of intravenous access for plasmapheresis. EXAM: TUNNELED CENTRAL VENOUS HEMODIALYSIS CATHETER PLACEMENT WITH ULTRASOUND AND FLUOROSCOPIC  GUIDANCE MEDICATIONS: The patient is currently admitted to the hospital receiving intravenous antibiotics. The antibiotic was given in an appropriate time interval prior to skin puncture. ANESTHESIA/SEDATION: None, per patient request FLUOROSCOPY TIME:  42 seconds (2 mGy). COMPLICATIONS: None immediate. PROCEDURE: Informed written consent was obtained from the patient after a discussion of the risks, benefits, and alternatives to  treatment. Questions regarding the procedure were encouraged and answered. Given the presence of the existing right jugular approach port a catheter decision was made to place a left internal jugular approach dialysis catheter. As such, the left neck and chest were prepped with chlorhexidine in a sterile fashion, and a sterile drape was applied covering the operative field. Maximum barrier sterile technique with sterile gowns and gloves were used for the procedure. A timeout was performed prior to the initiation of the procedure. After creating a small venotomy incision, a micropuncture kit was utilized to access the internal jugular vein. Real-time ultrasound guidance was utilized for vascular access including the acquisition of a permanent ultrasound image documenting patency of the accessed vessel. The microwire was utilized to measure appropriate catheter length. A stiff Glidewire was advanced to the level of the IVC and the micropuncture sheath was exchanged for a peel-away sheath. A palindrome tunneled hemodialysis catheter measuring 19 cm from tip to cuff was tunneled in a retrograde fashion from the anterior chest wall to the venotomy incision. The catheter was then placed through the peel-away sheath with tips ultimately positioned within the superior aspect of the right atrium. Final catheter positioning was confirmed and documented with a spot radiographic image. The catheter aspirates and flushes normally. The catheter was flushed with appropriate volume heparin dwells. The catheter exit site was secured with a 0-Prolene retention suture. The venotomy incision was closed with Steri-strips. Dressings were applied. The patient tolerated the procedure well without immediate post procedural complication. IMPRESSION: Successful placement of 19 cm tip to cuff tunneled hemodialysis catheter via the left internal jugular vein with tips terminating within the superior aspect of the right atrium. The catheter is  ready for immediate use. Electronically Signed   By: Sandi Mariscal M.D.   On: 11/03/2020 15:57     Medical Consultants:    None.   Subjective:    Leidi Astle she has no new complaints this morning except for her food and wanting to get out of the hospital.  Objective:    Vitals:   11/03/20 2125 11/04/20 0904 11/04/20 0915 11/04/20 0918  BP: 110/74  (P) 116/77   Pulse: 66 76 (P) 79   Resp:  16    Temp: 98.8 F (37.1 C)  (P) 97.9 F (36.6 C)   TempSrc: Oral  (P) Oral   SpO2: 100% 100%  100%  Weight:      Height:       SpO2: 100 % O2 Flow Rate (L/min): 2 L/min FiO2 (%): 28 %   Intake/Output Summary (Last 24 hours) at 11/04/2020 1040 Last data filed at 11/04/2020 0318 Gross per 24 hour  Intake 1037.48 ml  Output 525 ml  Net 512.48 ml   Filed Weights   10/28/20 1400  Weight: 49 kg    Exam: General exam: In no acute distress, cachectic Respiratory system: Good air movement and clear to auscultation. Cardiovascular system: S1 & S2 heard, RRR. No JVD. Gastrointestinal system: Abdomen is nondistended, soft and nontender.  Extremities: No pedal edema. Skin: No rashes, lesions or ulcers Psychiatry: Judgement and insight appear normal. Mood & affect appropriate.  Data Reviewed:    Labs: Basic Metabolic Panel: Recent Labs  Lab 10/30/20 0455 10/31/20 0423 10/31/20 8675 11/01/20 0403 11/02/20 0323 11/03/20 0332 11/04/20 0343  NA 137  --  136 137 137 139 138  K 3.2*  --  2.8* 3.3* 3.9 3.6 3.7  CL 106  --  101 102 104 107 106  CO2 25  --  25 28 27 27 26   GLUCOSE 80  --  100* 85 101* 77 80  BUN 9  --  6 <5* <5* 5* 7  CREATININE 0.55  --  0.49 0.42* 0.50 0.50 0.59  CALCIUM 7.5*  --  7.6* 7.5* 7.8* 7.9* 8.5*  MG 1.8 1.8  --   --   --   --   --   PHOS  --  1.2*  --   --  1.9*  --   --    GFR Estimated Creatinine Clearance: 80.3 mL/min (by C-G formula based on SCr of 0.59 mg/dL). Liver Function Tests: No results for input(s): AST, ALT, ALKPHOS,  BILITOT, PROT, ALBUMIN in the last 168 hours. No results for input(s): LIPASE, AMYLASE in the last 168 hours. No results for input(s): AMMONIA in the last 168 hours. Coagulation profile No results for input(s): INR, PROTIME in the last 168 hours. COVID-19 Labs  No results for input(s): DDIMER, FERRITIN, LDH, CRP in the last 72 hours.  Lab Results  Component Value Date   SARSCOV2NAA NEGATIVE 10/27/2020   [place] NEGATIVE 07/09/2020   [place] NEGATIVE 05/16/2020    CBC: Recent Labs  Lab 10/31/20 0428 11/01/20 0403 11/02/20 0323 11/03/20 0332 11/04/20 0343  WBC 8.8 6.9 10.0 7.7 11.7*  NEUTROABS 8.4* 5.0 7.4 5.1 11.1*  HGB 8.7* 8.4* 8.7* 8.1* 8.7*  HCT 27.4* 25.9* 27.5* 26.2* 28.2*  MCV 82.5 82.2 82.8 84.8 85.7  PLT 322 269 328 318 326   Cardiac Enzymes: No results for input(s): CKTOTAL, CKMB, CKMBINDEX, TROPONINI in the last 168 hours. BNP (last 3 results) No results for input(s): PROBNP in the last 8760 hours. CBG: No results for input(s): GLUCAP in the last 168 hours. D-Dimer: No results for input(s): DDIMER in the last 72 hours. Hgb A1c: No results for input(s): HGBA1C in the last 72 hours. Lipid Profile: No results for input(s): CHOL, HDL, LDLCALC, TRIG, CHOLHDL, LDLDIRECT in the last 72 hours. Thyroid function studies: No results for input(s): TSH, T4TOTAL, T3FREE, THYROIDAB in the last 72 hours.  Invalid input(s): FREET3 Anemia work up: No results for input(s): VITAMINB12, FOLATE, FERRITIN, TIBC, IRON, RETICCTPCT in the last 72 hours. Sepsis Labs: Recent Labs  Lab 11/01/20 0403 11/02/20 0323 11/03/20 0332 11/04/20 0343  WBC 6.9 10.0 7.7 11.7*   Microbiology Recent Results (from the past 240 hour(s))  Resp Panel by RT-PCR (Flu A&B, Covid) Nasopharyngeal Swab     Status: None   Collection Time: 10/27/20 12:11 AM   Specimen: Nasopharyngeal Swab; Nasopharyngeal(NP) swabs in vial transport medium  Result Value Ref Range Status   SARS Coronavirus 2  by RT PCR NEGATIVE NEGATIVE Final    Comment: (NOTE) SARS-CoV-2 target nucleic acids are NOT DETECTED.  The SARS-CoV-2 RNA is generally detectable in upper respiratory specimens during the acute phase of infection. The lowest concentration of SARS-CoV-2 viral copies this assay can detect is 138 copies/mL. A negative result does not preclude SARS-Cov-2 infection and should not be used as the sole basis for treatment or other patient management decisions. A negative result may occur with  improper specimen collection/handling,  submission of specimen other than nasopharyngeal swab, presence of viral mutation(s) within the areas targeted by this assay, and inadequate number of viral copies(<138 copies/mL). A negative result must be combined with clinical observations, patient history, and epidemiological information. The expected result is Negative.  Fact Sheet for Patients:  EntrepreneurPulse.com.au  Fact Sheet for Healthcare Providers:  IncredibleEmployment.be  This test is no t yet approved or cleared by the [place] FDA and  has been authorized for detection and/or diagnosis of SARS-CoV-2 by FDA under an Emergency Use Authorization (EUA). This EUA will remain  in effect (meaning this test can be used) for the duration of the COVID-19 declaration under Section 564(b)(1) of the Act, 21 U.S.C.section 360bbb-3(b)(1), unless the authorization is terminated  or revoked sooner.       Influenza A by PCR NEGATIVE NEGATIVE Final   Influenza B by PCR NEGATIVE NEGATIVE Final    Comment: (NOTE) The Xpert Xpress SARS-CoV-2/FLU/RSV plus assay is intended as an aid in the diagnosis of influenza from Nasopharyngeal swab specimens and should not be used as a sole basis for treatment. Nasal washings and aspirates are unacceptable for Xpert Xpress SARS-CoV-2/FLU/RSV testing.  Fact Sheet for Patients: EntrepreneurPulse.com.au  Fact  Sheet for Healthcare Providers: IncredibleEmployment.be  This test is not yet approved or cleared by the [place] FDA and has been authorized for detection and/or diagnosis of SARS-CoV-2 by FDA under an Emergency Use Authorization (EUA). This EUA will remain in effect (meaning this test can be used) for the duration of the COVID-19 declaration under Section 564(b)(1) of the Act, 21 U.S.C. section 360bbb-3(b)(1), unless the authorization is terminated or revoked.  Performed at [facility], [place] [address], [place], [place] [address]   Blood Culture (routine x 2)     Status: None   Collection Time: 10/27/20  3:15 AM   Specimen: BLOOD  Result Value Ref Range Status   Specimen Description BLOOD LEFT ARM  Final   Special Requests   Final    BOTTLES DRAWN AEROBIC AND ANAEROBIC Blood Culture results may not be optimal due to an inadequate volume of blood received in culture bottles   Culture   Final    NO GROWTH 5 DAYS Performed at [facility], [place] [address], [place], [place] [address]    Report Status 11/01/2020 FINAL  Final  Urine culture     Status: Abnormal   Collection Time: 10/27/20  4:20 AM   Specimen: In/Out Cath Urine  Result Value Ref Range Status   Specimen Description IN/OUT CATH URINE  Final   Special Requests   Final    NONE Performed at [facility], [place] [address], [place], [place] [address]    Culture 30,000 COLONIES/mL PSEUDOMONAS AERUGINOSA (A)  Final   Report Status 10/29/2020 FINAL  Final   Organism ID, Bacteria PSEUDOMONAS AERUGINOSA (A)  Final      Susceptibility   Pseudomonas aeruginosa - MIC*    CEFTAZIDIME 2 SENSITIVE Sensitive     CIPROFLOXACIN <=0.25 SENSITIVE Sensitive     GENTAMICIN <=1 SENSITIVE Sensitive     IMIPENEM 0.5 SENSITIVE Sensitive     PIP/TAZO <=4 SENSITIVE Sensitive     CEFEPIME 2 SENSITIVE Sensitive     * 30,000 COLONIES/mL PSEUDOMONAS AERUGINOSA  Blood Culture (routine x 2)     Status: None    Collection Time: 10/27/20  9:38 AM   Specimen: BLOOD  Result Value Ref Range Status   Specimen Description BLOOD RIGHT  ANTECUBITAL  Final   Special Requests   Final    BOTTLES DRAWN AEROBIC AND ANAEROBIC Blood Culture adequate volume   Culture   Final    NO GROWTH 5 DAYS Performed at [facility], [address] [place], [place] [address]    Report Status 11/01/2020 FINAL  Final     Medications:   . acetaminophen      . acetaminophen  1,000 mg Oral Once  . baclofen  10 mg Oral QID  . calcium carbonate  2 tablet Oral Q3H  . cromolyn  200 mg Oral TID AC & HS  . cromolyn  20 mg Nebulization TID  . enoxaparin (LOVENOX) injection  40 mg Subcutaneous Q24H  . famotidine  40 mg Oral BID  . fluticasone-salmeterol  1 puff Inhalation BID  . heparin sodium (porcine)  1,000 Units Intracatheter Once  . ipratropium-albuterol  3 mL Inhalation BID  . ivabradine  5 mg Oral BID  . levocetirizine  5 mg Oral QID  . predniSONE  20 mg Oral Daily  . pyridostigmine  90 mg Oral Q3H  . sodium chloride flush  10-40 mL Intracatheter Q12H  . tetracaine  100 mg Infiltration Once   Continuous Infusions: . sodium chloride 100 mL/hr at 11/04/20 0311  . therapeutic plasma exchange solution    . therapeutic plasma exchange solution    . anticoagulant sodium citrate    . calcium gluconate    . calcium gluconate    . citrate dextrose    . citrate dextrose    . citrate dextrose        LOS: 8 days   [facility]  11/04/2020, 10:40 AM

## 2020-11-05 DIAGNOSIS — A419 Sepsis, unspecified organism: Secondary | ICD-10-CM

## 2020-11-05 DIAGNOSIS — E43 Unspecified severe protein-calorie malnutrition: Secondary | ICD-10-CM | POA: Diagnosis not present

## 2020-11-05 LAB — MISC LABCORP TEST (SEND OUT): Labcorp test code: 716910

## 2020-11-05 LAB — BASIC METABOLIC PANEL
Anion gap: 5 (ref 5–15)
BUN: <5 mg/dL — ABNORMAL LOW (ref 6–20)
CO2: 26 mmol/L (ref 22–32)
Calcium: 8.3 mg/dL — ABNORMAL LOW (ref 8.9–10.3)
Chloride: 108 mmol/L (ref 98–111)
Creatinine, Ser: 0.44 mg/dL (ref 0.44–1.00)
GFR, Estimated: >60 mL/min (ref >60)
Glucose, Bld: 113 mg/dL — ABNORMAL HIGH (ref 70–99)
Potassium: 3.3 mmol/L — ABNORMAL LOW (ref 3.5–5.1)
Sodium: 139 mmol/L (ref 135–145)

## 2020-11-05 LAB — CBC WITH DIFFERENTIAL/PLATELET
Abs Immature Granulocytes: 0.51 10*3/uL — ABNORMAL HIGH (ref 0.00–0.07)
Basophils Absolute: 0 10*3/uL (ref 0.0–0.1)
Basophils Relative: 0 %
Eosinophils Absolute: 0 10*3/uL (ref 0.0–0.5)
Eosinophils Relative: 0 %
HCT: 26.6 % — ABNORMAL LOW (ref 36.0–46.0)
Hemoglobin: 8.1 g/dL — ABNORMAL LOW (ref 12.0–15.0)
Immature Granulocytes: 5 %
Lymphocytes Relative: 11 %
Lymphs Abs: 1.1 10*3/uL (ref 0.7–4.0)
MCH: 26.5 pg (ref 26.0–34.0)
MCHC: 30.5 g/dL (ref 30.0–36.0)
MCV: 86.9 fL (ref 80.0–100.0)
Monocytes Absolute: 0.4 10*3/uL (ref 0.1–1.0)
Monocytes Relative: 4 %
Neutro Abs: 7.4 10*3/uL (ref 1.7–7.7)
Neutrophils Relative %: 80 %
Platelets: 237 10*3/uL (ref 150–400)
RBC: 3.06 MIL/uL — ABNORMAL LOW (ref 3.87–5.11)
RDW: 16.3 % — ABNORMAL HIGH (ref 11.5–15.5)
WBC: 9.4 10*3/uL (ref 4.0–10.5)
nRBC: 0 % (ref 0.0–0.2)

## 2020-11-05 LAB — ACETYLCHOLINE RECEPTOR AB, ALL
Acety choline binding ab: 0.03 nmol/L (ref 0.00–0.24)
Acetylchol Block Ab: 17 % (ref 0–25)

## 2020-11-05 LAB — ZINC: Zinc: 69 ug/dL (ref 44–115)

## 2020-11-05 NOTE — TOC Initial Note (Signed)
Transition of Care [facility]) - Initial/Assessment Note    Patient Details  Name: Brandy [name] MRN: [id number] Date of Birth: Jun 23, 1990  Transition of Care [facility]) CM/SW Contact:    Gala Lewandowsky, RN Phone Number: 11/05/2020, 5:46 PM  Clinical Narrative:  Risk for readmission assessment completed. Case Manager received a call to assist with this case regarding discharge planning, plasmapheresis, and transportation. Patient states that she lives alone and has support of neighbors. Per patient she is ready to transition home tonight, she wants to eat her own food. Case Manager contacted HD to see if plasmapheresis has been arranged. Per Sherral Hammers was aware of the treatment at 8:00 am and the additional treatments have been scheduled. Patient will have three more treatments thereafter-next week: Mon, Wed, Friday. Unfortunately, transportation has not been arranged for outpatient plasmapheresis treatments. Patient states she uses [employer] and a transportation service through her insurance company. Patient states the transportation company cannot smell like tobacco, perfumes, or any fragrances. Patient states that [employer] usually accomodates her wishes. CSW is aware to assist the patient with transportation to and from outpatient plasmapheresis treatments- the patient has the number to both transportation agencies. Patient will need ambulance tranpsortation home and the address is correct. Staff to collaborate with HD in am to make sure the patient is on list for am treatment. Case Manager called the physician and he is aware that the patient will stay overnight and discharge after plasmapheresis treatment. Patient did not have a preference for a home health agency. Case Manager was able to call [facility] and schedule home health services with them. [facility]-start of care to begin within 24-48 hours post transition home. No further needs identified from this Case Manager.            Expected  Discharge Plan: Home w Home Health Services Barriers to Discharge: Transportation (Transportation was not set up for outpatient plasmapheresis)   Patient Goals and CMS Choice Patient states their goals for this hospitalization and ongoing recovery are:: to go home and eat her food   Choice offered to / list presented to :  (Patient did not have home health preference.)  Expected Discharge Plan and Services Expected Discharge Plan: Home w Home Health Services In-house Referral: Clinical Social Work Discharge Planning Services: CM Consult Post Acute Care Choice: Home Health Living arrangements for the past 2 months: Apartment Expected Discharge Date: 11/05/20               DME Arranged: N/A DME Agency: NA       HH Arranged: RN,Disease Management,PT HH AgencyHotel manager Home Health Care Date [facility] Agency Contacted: 11/05/20 Time HH Agency Contacted: [phone] Representative spoke with at [facility] Agency: Kandee Keen  Prior Living Arrangements/Services Living arrangements for the past 2 months: Apartment Lives with:: Self (Patient states she has support of neighbors.) Patient language and need for interpreter reviewed:: Yes        Need for Family Participation in Patient Care: Yes (Comment) Care giver support system in place?: Yes (comment) Current home services: Homehealth aide,DME (Pt has a pcs adie 42 hours a week and a motorized wheelchair) Criminal Activity/Legal Involvement Pertinent to Current Situation/Hospitalization: No - Comment as needed  Activities of Daily Living Home Assistive Devices/Equipment: None ADL Screening (condition at time of admission) Patient's cognitive ability adequate to safely complete daily activities?: Yes Is the patient deaf or have difficulty hearing?: Yes Does the patient have difficulty seeing, even when wearing glasses/contacts?: No Does the patient have difficulty  concentrating, remembering, or making decisions?: Yes Patient able to express need for assistance  with ADLs?: Yes Does the patient have difficulty dressing or bathing?: Yes Independently performs ADLs?: No Communication: Needs assistance Dressing (OT): Needs assistance Grooming: Needs assistance Feeding: Needs assistance Bathing: Needs assistance Toileting: Needs assistance Does the patient have difficulty walking or climbing stairs?: Yes Weakness of Legs: Both Weakness of Arms/Hands: Both  Permission Sought/Granted Permission sought to share information with : Case Manager,Facility Contact Representative Permission granted to share information with : Yes, Verbal Permission Granted     Permission granted to share info w AGENCY: [facility]        Emotional Assessment Appearance:: Appears stated age Attitude/Demeanor/Rapport: Engaged Affect (typically observed): Tearful/Crying (Patient in tears wants to eat food that she likes) Orientation: : Oriented to Self,Oriented to Place,Oriented to Situation,Oriented to  Time Alcohol / Substance Use: Not Applicable Psych Involvement: No (comment)  Admission diagnosis:  Sepsis (HCC) [A41.9] Sepsis due to pneumonia (HCC) [J18.9, A41.9] Community acquired pneumonia, unspecified laterality [J18.9] Sepsis, due to unspecified organism, unspecified whether acute organ dysfunction present (HCC) [A41.9] Fever [R50.9] Patient Active Problem List   Diagnosis Date Noted  . Pressure injury of skin 10/31/2020  . SIRS (systemic inflammatory response syndrome) (HCC) 10/27/2020  . Pneumonia of both lower lobes due to infectious organism 10/27/2020  . Pain around PEG tube site 10/27/2020  . Hyponatremia 10/27/2020  . History of poor personal hygiene 10/27/2020  . Fever 10/27/2020  . Feeding tube dysfunction 07/10/2020  . 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 Ehlers-Danlos syndrome 06/28/2020  . Uses feeding tube 06/28/2020  . Restricted diet 06/28/2020  . 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 Chronic idiopathic urticaria 06/28/2020  . POTS (postural orthostatic tachycardia syndrome) 06/28/2020  . Dysautonomia (HCC) 06/28/2020  . PTSD (post-traumatic stress disorder) 06/28/2020  . GAD (generalized anxiety disorder) 06/28/2020  . History of major depression 06/28/2020  . Absolute anemia 06/28/2020  . On home oxygen therapy 06/28/2020  . Fibromyalgia 06/28/2020  . Myasthenia gravis (HCC) 05/23/2020  . Severe protein-calorie malnutrition (HCC) 05/20/2020  . Myasthenia exacerbation (HCC) 05/16/2020   PCP:  Pcp, No Pharmacy:   [id number] - [place],  - [address] AT [facility] [address] [place] [address] Phone: [phone] Fax: [phone]   Readmission Risk Interventions Readmission Risk Prevention Plan 11/05/2020  Transportation Screening Complete  Medication Review (RN Care Manager) Complete  HRI or Home Care Consult Complete  SW Recovery Care/Counseling Consult Complete  Palliative Care Screening Not Applicable  Skilled Nursing Facility Not Applicable

## 2020-11-05 NOTE — Progress Notes (Signed)
Pt performed a NIF -12, VC 

## 2020-11-05 NOTE — Progress Notes (Signed)
Physical Therapy Treatment Patient Details Name: Brandy [name] MRN: [id number] DOB: December 28, 1990 Today's Date: 11/05/2020    History of Present Illness 30 year old female with past medical history of myasthenia gravis, Ehlers-Danlos syndrome, mast cell activation syndrome, POTS, protein calorie malnutrition status post PEG tube placement and dysautonomia who presents to [facility] emergency department with complaints of cough shortness of breath and fever. Found to have Pneumonia of the bilateral lower lobes due to infectious organism and SIRS    PT Comments    Pt agreeable to sitting EOB and brief standing. Pt wants portable O2 at home stating she has trouble breathing when using her respirator mask for several hours. Checked pt's SpO2 on RA and with respirator mask on. No signs of low SpO2.    Follow Up Recommendations  Home health PT;Supervision for mobility/OOB (Pt refusing SNF)     Equipment Recommendations  Other (comment)    Recommendations for Other Services       Precautions / Restrictions Precautions Precautions: Fall Restrictions Weight Bearing Restrictions: No    Mobility  Bed Mobility Overal bed mobility: Modified Independent             General bed mobility comments: HOB raised and using rail    Transfers Overall transfer level: Needs assistance Equipment used: 2 person hand held assist Transfers: Sit to/from Stand Sit to Stand: Mod assist;+2 safety/equipment         General transfer comment: Assist to partially stand in flexed position and leaning against bed  Ambulation/Gait                 Stairs             Wheelchair Mobility    Modified Rankin (Stroke Patients Only)       Balance Overall balance assessment: Needs assistance Sitting-balance support: No upper extremity supported;Feet supported Sitting balance-Leahy Scale: Good     Standing balance support: Bilateral upper extremity supported Standing  balance-Leahy Scale: Poor Standing balance comment: bilateral hand held to partially stand                            Cognition Arousal/Alertness: Awake/alert Behavior During Therapy: Anxious Overall Cognitive Status: No family/caregiver present to determine baseline cognitive functioning                                 General Comments: Pt with strong need to control her environment.      Exercises      General Comments General comments (skin integrity, edema, etc.): Removed O2. SpO2 >94% throughout pt's activity and pt wearing her respirator that she uses when she goes out      Pertinent Vitals/Pain Pain Assessment: Faces Faces Pain Scale: Hurts a little bit Pain Location: generalized Pain Descriptors / Indicators: Grimacing Pain Intervention(s): Limited activity within patient's tolerance    Home Living                      Prior Function            PT Goals (current goals can now be found in the care plan section) Acute Rehab PT Goals Patient Stated Goal: go home with HHPT/HHPT and HHAide Progress towards PT goals: Progressing toward goals    Frequency    Min 3X/week      PT Plan Discharge plan needs to be updated  Co-evaluation              AM-PAC PT "6 Clicks" Mobility   Outcome Measure  Help needed turning from your back to your side while in a flat bed without using bedrails?: None Help needed moving from lying on your back to sitting on the side of a flat bed without using bedrails?: None Help needed moving to and from a bed to a chair (including a wheelchair)?: A Lot Help needed standing up from a chair using your arms (e.g., wheelchair or bedside chair)?: Total Help needed to walk in hospital room?: Total Help needed climbing 3-5 steps with a railing? : Total 6 Click Score: 13    End of Session Equipment Utilized During Treatment: Oxygen Activity Tolerance: Patient limited by fatigue Patient left: in  bed;with call bell/phone within reach;with bed alarm set Nurse Communication: Mobility status PT Visit Diagnosis: Muscle weakness (generalized) (M62.81);Adult, failure to thrive (R62.7);Difficulty in walking, not elsewhere classified (R26.2);Other symptoms and signs involving the nervous system (R29.898)     Time: 0488-8916 PT Time Calculation (min) (ACUTE ONLY): 30 min  Charges:                        [facility] PT Acute Rehabilitation Services Pager [phone] Office [phone]    Angelina Ok [facility] 11/05/2020, 11:22 AM

## 2020-11-05 NOTE — Care Management Important Message (Signed)
Important Message  Patient Details  Name: Brandy [name] MRN: [id number] Date of Birth: 14-Oct-1990   Medicare Important Message Given:  Yes     Levone Otten P Philana Younis 11/05/2020, 2:22 PM

## 2020-11-05 NOTE — Discharge Summary (Signed)
Physician Discharge Summary  Brandy [name] KYH:[id number] DOB: Dec 16, 1990 DOA: 10/26/2020  PCP: Pcp, No  Admit date: 10/26/2020 Discharge date: 11/05/2020  Admitted From: home Disposition:  Home  Recommendations for Outpatient Follow-up:  1. Follow up with PCP in 1-2 weeks 2. Please obtain BMP/CBC in one week 3. She will continue her Plex treatment every other day for 4 additional treatments her next 1 is 11/06/2020. 4. Her central line is to be discontinued when she has completed her 4 additional treatments of Plex. 5. Follow-up at [facility] as an outpatient for PEG tube exchange.   Home Health:YEs Equipment/Devices:None  Discharge Condition:Stable CODE STATUS:Full Diet recommendation: Heart Healthy  Brief/Interim Summary: 30 y.o. 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  He relates that the symptoms started about 2 weeks prior to admission.  Diagnosed with bilateral lower lobe pneumonia and abdominal wall cellulitis surrounding the PEG tube site  Discharge Diagnoses:  Principal Problem:   SIRS (systemic inflammatory response syndrome) (HCC) Active Problems:   Severe protein-calorie malnutrition (HCC)   Myasthenia gravis (HCC)   Ehlers-Danlos syndrome   Mast cell activation syndrome (HCC)   POTS (postural orthostatic tachycardia syndrome)   Dysautonomia (HCC)   Pneumonia of both lower lobes due to infectious organism   Pain around PEG tube site   Hyponatremia   History of poor personal hygiene   Fever   Pressure injury of skin  Bilateral lower lobe pneumonia: She was placed on higher dose of oxygen she was started empirically on antibiotics which she completed her treatment in house.  Possible abdominal wall cellulitis surrounding the PEG tube site: She reported purulent  drainage from her PEG tube with erythema she was started empirically on antibiotics PEG tube was exchanged and she completed her course of antibiotics in house. She will follow-up with [facility] for PEG tube exchange as an outpatient.  The patient is tolerating her diet per mouth.  Myasthenia gravis flare: Neurology was consulted recommended Plex treatment which was started on 11/04/2020. Catheter was placed on 11/03/2020. She was continued on Mestinon and physical therapy. She tolerated her Plex treatment and how she will continue her additional Plex treatment as an outpatient.  Hypokalemia/hypophosphatemia: They were repleted now resolved.  Anemia of chronic disease: Her hemoglobin remained at baseline.  Mattes cell activation syndrome/or less now Syndrome: No change made to her medication follow-up with [facility] as an outpatient.  Severe protein caloric malnutrition: Nutrition was consulted.  Discharge Instructions  Discharge Instructions    Diet - low sodium heart healthy   Complete by: As directed    Increase activity slowly   Complete by: As directed    No wound care   Complete by: As directed      Allergies as of 11/05/2020      Reactions   Aspirin    Nsaids    Quinolones    Sulfa Antibiotics    Telithromycin    Fludrocortisone    Ambien [zolpidem]    Amitriptyline    Beef-potatoes-spinach [compleat]    Botulinum Toxins    Chlorhexidine Hives   Ciprofloxacin    Codeine    Contrast Media [iodinated Diagnostic Agents]    Creon [pancrelipase (lip-prot-amyl)]    Cymbalta [duloxetine Hcl]    Dextrans    Ergotamine    Fluoxetine    All SSRIs All SSRIs  Gabapentin    Gallamine    Hydrocodone    Iodine    Lactose Intolerance (gi)    Levofloxacin    Lunesta [eszopiclone]    Macrolides And Ketolides    Maprotiline    Marplan [isocarboxazid]    Any MAOI contraindicated   Midodrine    Morphine And Related    Nitrous Oxide    Oxycodone    Pethidine  [meperidine]    Phenergan [promethazine]    Phenobarbital    Pyridium [phenazopyridine]    Reglan [metoclopramide]    Remeron [mirtazapine]    Noradrenergic antagonist   Salicylates    Savella [milnacipran]    Scopolamine    Sumatriptan    Tape    Topiramate    Tricyclic Antidepressants    Tums [calcium Carbonate]    Patient reports allergic.  RN to find out adverse reaction and update allergy field.    Vancomycin    Verapamil       Medication List    TAKE these medications   albuterol 108 (90 Base) MCG/ACT inhaler Commonly known as: VENTOLIN HFA Inhale into the lungs every 6 (six) hours as needed for wheezing or shortness of breath.   baclofen 10 MG tablet Commonly known as: LIORESAL Take 1 tablet (10 mg total) by mouth See admin instructions. Take one 10 mg tablet four times a day at 6AM, 12PM, 6PM, and 12AM   Corlanor 5 MG Tabs tablet Generic drug: ivabradine Take 1 tablet (5 mg total) by mouth 2 (two) times daily. 6am,6pm   cromolyn 100 MG/5ML solution Commonly known as: GASTROCROM Take 100 mg by mouth See admin instructions. 2 ampule in water taken 4 times a day before meals/bed and PRN   cromolyn 20 MG/2ML nebulizer solution Commonly known as: INTAL Take 20 mg by nebulization See admin instructions. 1 vial taken 3 times a day and PRN   diphenhydrAMINE-zinc acetate cream Commonly known as: BENADRYL Apply 1-2 application topically daily as needed (Rash from Mast Cell and Allergies).   EPINEPHrine 0.3 mg/0.3 mL Soaj injection Commonly known as: EPI-PEN Inject 0.3 mg into the muscle as needed for anaphylaxis.   famotidine 40 MG tablet Commonly known as: PEPCID Take 40 mg by mouth See admin instructions. Take one 40 mg tablet twice a day at 6AM and 6PM.   ipratropium-albuterol 0.5-2.5 (3) MG/3ML Soln Commonly known as: DUONEB Inhale 3 mLs into the lungs See admin instructions. Qid and prn   ketotifen 0.025 % ophthalmic solution Commonly known as:  ZADITOR Place 1 drop into both eyes daily as needed (Mast cell, Allergies, MCS/EI).   levocetirizine 5 MG tablet Commonly known as: XYZAL Take 10 mg by mouth in the morning and at bedtime. 6am,6pm   LORazepam 0.5 MG tablet Commonly known as: ATIVAN Take 1 tablet (0.5 mg total) by mouth 2 (two) times daily as needed for anxiety.   ondansetron 4 MG disintegrating tablet Commonly known as: ZOFRAN-ODT Take 4 mg by mouth every 8 (eight) hours as needed for nausea or vomiting.   ondansetron 8 MG tablet Commonly known as: ZOFRAN Take 8 mg by mouth every 6 (six) hours as needed for nausea or vomiting.   Potassium Citrate 99 MG Caps Take 99 mg by mouth daily. 6 or 9pm   predniSONE 5 MG tablet Commonly known as: DELTASONE Take 5 tablets (25 mg total) by mouth daily with breakfast. What changed: additional instructions   predniSONE 20 MG tablet Commonly known as: DELTASONE Take 20 mg by mouth  daily. What changed: Another medication with the same name was changed. Make sure you understand how and when to take each.   pyridostigmine 60 MG tablet Commonly known as: MESTINON Take 1.5 tablets (90 mg total) by mouth every 3 (three) hours. 12am,3am,6am,9am,12pm,3pm,6pm,9pm   vitamin B-12 1000 MCG tablet Commonly known as: CYANOCOBALAMIN Take 1,000 mcg by mouth daily. 6 or 9pm   Vitamin D (Ergocalciferol) 1.25 MG (50000 UNIT) Caps capsule Commonly known as: DRISDOL Take 50,000 Units by mouth 2 (two) times a week.   Wixela Inhub 250-50 MCG/ACT Aepb Generic drug: fluticasone-salmeterol Inhale 1 puff into the lungs 2 (two) times daily. 6am and 6pm   zafirlukast 20 MG tablet Commonly known as: ACCOLATE Take 1 tablet (20 mg total) by mouth 2 (two) times daily before a meal. 6am 6pm       Allergies  Allergen Reactions  . Aspirin   . Nsaids   . Quinolones   . Sulfa Antibiotics   . Telithromycin   . Fludrocortisone   . Ambien [Zolpidem]   . Amitriptyline   .  Beef-Potatoes-Spinach [Compleat]   . Botulinum Toxins   . Chlorhexidine Hives  . Ciprofloxacin   . Codeine   . Contrast Media [Iodinated Diagnostic Agents]   . Creon [Pancrelipase (Lip-Prot-Amyl)]   . Cymbalta [Duloxetine Hcl]   . Dextrans   . Ergotamine   . Fluoxetine     All SSRIs All SSRIs   . Gabapentin   . Gallamine   . Hydrocodone   . Iodine   . Lactose Intolerance (Gi)   . Levofloxacin   . Lunesta [Eszopiclone]   . Macrolides And Ketolides   . Maprotiline   . Marplan [Isocarboxazid]     Any MAOI contraindicated  . Midodrine   . Morphine And Related   . Nitrous Oxide   . Oxycodone   . Pethidine [Meperidine]   . Phenergan [Promethazine]   . Phenobarbital   . Pyridium [Phenazopyridine]   . Reglan [Metoclopramide]   . Remeron [Mirtazapine]     Noradrenergic antagonist  . Salicylates   . Savella [Milnacipran]   . Scopolamine   . Sumatriptan   . Tape   . Topiramate   . Tricyclic Antidepressants   . Tums [Calcium Carbonate]     Patient reports allergic.  RN to find out adverse reaction and update allergy field.   . Vancomycin   . Verapamil     Consultations:  Infectious disease  Neurology     Procedures/Studies: IR US Guide Vasc Access Left  Result Date: 11/03/2020 INDICATION: History Esther's-Danlos, Mast cell activation syndrome and Myasthenia Gravis, in need of intravenous access for plasmapheresis. EXAM: TUNNELED CENTRAL VENOUS HEMODIALYSIS CATHETER PLACEMENT WITH ULTRASOUND AND FLUOROSCOPIC GUIDANCE MEDICATIONS: The patient is currently admitted to the hospital receiving intravenous antibiotics. The antibiotic was given in an appropriate time interval prior to skin puncture. ANESTHESIA/SEDATION: None, per patient request FLUOROSCOPY TIME:  42 seconds (2 mGy). COMPLICATIONS: None immediate. PROCEDURE: Informed written consent was obtained from the patient after a discussion of the risks, benefits, and alternatives to treatment. Questions regarding the  procedure were encouraged and answered. Given the presence of the existing right jugular approach port a catheter decision was made to place a left internal jugular approach dialysis catheter. As such, the left neck and chest were prepped with chlorhexidine in a sterile fashion, and a sterile drape was applied covering the operative field. Maximum barrier sterile technique with sterile gowns and gloves were used for the procedure. A timeout  was performed prior to the initiation of the procedure. After creating a small venotomy incision, a micropuncture kit was utilized to access the internal jugular vein. Real-time ultrasound guidance was utilized for vascular access including the acquisition of a permanent ultrasound image documenting patency of the accessed vessel. The microwire was utilized to measure appropriate catheter length. A stiff Glidewire was advanced to the level of the IVC and the micropuncture sheath was exchanged for a peel-away sheath. A palindrome tunneled hemodialysis catheter measuring 19 cm from tip to cuff was tunneled in a retrograde fashion from the anterior chest wall to the venotomy incision. The catheter was then placed through the peel-away sheath with tips ultimately positioned within the superior aspect of the right atrium. Final catheter positioning was confirmed and documented with a spot radiographic image. The catheter aspirates and flushes normally. The catheter was flushed with appropriate volume heparin dwells. The catheter exit site was secured with a 0-Prolene retention suture. The venotomy incision was closed with Steri-strips. Dressings were applied. The patient tolerated the procedure well without immediate post procedural complication. IMPRESSION: Successful placement of 19 cm tip to cuff tunneled hemodialysis catheter via the left internal jugular vein with tips terminating within the superior aspect of the right atrium. The catheter is ready for immediate use.  Electronically Signed   By: Sandi Mariscal M.D.   On: 11/03/2020 15:57   DG Abd Acute W/Chest  Result Date: 10/27/2020 CLINICAL DATA:  Sepsis like presentation. Myasthenia gravis and dysautonomia. EXAM: DG ABDOMEN ACUTE WITH 1 VIEW CHEST COMPARISON:  Chest x-ray 05/28/2020, CT abdomen pelvis 07/09/2020 FINDINGS: Accessed right chest wall Port-A-Cath with tip overlying the superior cavoatrial junction. Interval removal of a left chest wall dialysis catheter. Likely gastrojejunostomy tube overlies the left mid abdomen. The heart size and mediastinal contours are within normal limits. Interval development of right lower lobe opacity as well as new retrocardiac opacity. No pulmonary edema. No pleural effusion. No pneumothorax. There is no evidence of dilated bowel loops or free intraperitoneal air. No radiopaque calculi or other significant radiographic abnormality is seen. No acute osseous abnormality. IMPRESSION: 1. Development bilateral lower lung zone airspace opacities suggestive of infection or inflammation. Followup PA and lateral chest X-ray is recommended in 3-4 weeks following therapy to ensure resolution and exclude underlying malignancy. 2. Nonobstructive bowel gas pattern. Electronically Signed   By: Iven Finn M.D.   On: 10/27/2020 01:28    (Echo, Carotid, EGD, Colonoscopy, ERCP)    Subjective: No new complaints.  Discharge Exam: Vitals:   11/05/20 0412 11/05/20 0736  BP: 104/68   Pulse: 71   Resp: 18   Temp: 97.6 F (36.4 C)   SpO2: 100% 100%   Vitals:   11/04/20 2046 11/04/20 2134 11/05/20 0412 11/05/20 0736  BP:  115/78 104/68   Pulse: 74 71 71   Resp: 18 16 18    Temp:  97.9 F (36.6 C) 97.6 F (36.4 C)   TempSrc:  Oral Oral   SpO2: 100% 100% 100% 100%  Weight:      Height:        General: Pt is alert, awake, not in acute distress Cardiovascular: RRR, S1/S2 +, no rubs, no gallops Respiratory: CTA bilaterally, no wheezing, no rhonchi Abdominal: Soft, NT, ND,  bowel sounds + Extremities: no edema, no cyanosis    The results of significant diagnostics from this hospitalization (including imaging, microbiology, ancillary and laboratory) are listed below for reference.     Microbiology: Recent Results (from the past  240 hour(s))  Resp Panel by RT-PCR (Flu A&B, Covid) Nasopharyngeal Swab     Status: None   Collection Time: 10/27/20 12:11 AM   Specimen: Nasopharyngeal Swab; Nasopharyngeal(NP) swabs in vial transport medium  Result Value Ref Range Status   SARS Coronavirus 2 by RT PCR NEGATIVE NEGATIVE Final    Comment: (NOTE) SARS-CoV-2 target nucleic acids are NOT DETECTED.  The SARS-CoV-2 RNA is generally detectable in upper respiratory specimens during the acute phase of infection. The lowest concentration of SARS-CoV-2 viral copies this assay can detect is 138 copies/mL. A negative result does not preclude SARS-Cov-2 infection and should not be used as the sole basis for treatment or other patient management decisions. A negative result may occur with  improper specimen collection/handling, submission of specimen other than nasopharyngeal swab, presence of viral mutation(s) within the areas targeted by this assay, and inadequate number of viral copies(<138 copies/mL). A negative result must be combined with clinical observations, patient history, and epidemiological information. The expected result is Negative.  Fact Sheet for Patients:  EntrepreneurPulse.com.au  Fact Sheet for Healthcare Providers:  IncredibleEmployment.be  This test is no t yet approved or cleared by the [place] FDA and  has been authorized for detection and/or diagnosis of SARS-CoV-2 by FDA under an Emergency Use Authorization (EUA). This EUA will remain  in effect (meaning this test can be used) for the duration of the COVID-19 declaration under Section 564(b)(1) of the Act, 21 U.S.C.section 360bbb-3(b)(1), unless the  authorization is terminated  or revoked sooner.       Influenza A by PCR NEGATIVE NEGATIVE Final   Influenza B by PCR NEGATIVE NEGATIVE Final    Comment: (NOTE) The Xpert Xpress SARS-CoV-2/FLU/RSV plus assay is intended as an aid in the diagnosis of influenza from Nasopharyngeal swab specimens and should not be used as a sole basis for treatment. Nasal washings and aspirates are unacceptable for Xpert Xpress SARS-CoV-2/FLU/RSV testing.  Fact Sheet for Patients: EntrepreneurPulse.com.au  Fact Sheet for Healthcare Providers: IncredibleEmployment.be  This test is not yet approved or cleared by the [place] FDA and has been authorized for detection and/or diagnosis of SARS-CoV-2 by FDA under an Emergency Use Authorization (EUA). This EUA will remain in effect (meaning this test can be used) for the duration of the COVID-19 declaration under Section 564(b)(1) of the Act, 21 U.S.C. section 360bbb-3(b)(1), unless the authorization is terminated or revoked.  Performed at [facility], [place] [address], [place], [place] [address]   Blood Culture (routine x 2)     Status: None   Collection Time: 10/27/20  3:15 AM   Specimen: BLOOD  Result Value Ref Range Status   Specimen Description BLOOD LEFT ARM  Final   Special Requests   Final    BOTTLES DRAWN AEROBIC AND ANAEROBIC Blood Culture results may not be optimal due to an inadequate volume of blood received in culture bottles   Culture   Final    NO GROWTH 5 DAYS Performed at [facility], [place] [address] [place], [place] [address]    Report Status 11/01/2020 FINAL  Final  Urine culture     Status: Abnormal   Collection Time: 10/27/20  4:20 AM   Specimen: In/Out Cath Urine  Result Value Ref Range Status   Specimen Description IN/OUT CATH URINE  Final   Special Requests   Final    NONE Performed at [facility], [place] [address], [place], [place] [address]    Culture  30,000  COLONIES/mL PSEUDOMONAS AERUGINOSA (A)  Final   Report Status 10/29/2020 FINAL  Final   Organism ID, Bacteria PSEUDOMONAS AERUGINOSA (A)  Final      Susceptibility   Pseudomonas aeruginosa - MIC*    CEFTAZIDIME 2 SENSITIVE Sensitive     CIPROFLOXACIN <=0.25 SENSITIVE Sensitive     GENTAMICIN <=1 SENSITIVE Sensitive     IMIPENEM 0.5 SENSITIVE Sensitive     PIP/TAZO <=4 SENSITIVE Sensitive     CEFEPIME 2 SENSITIVE Sensitive     * 30,000 COLONIES/mL PSEUDOMONAS AERUGINOSA  Blood Culture (routine x 2)     Status: None   Collection Time: 10/27/20  9:38 AM   Specimen: BLOOD  Result Value Ref Range Status   Specimen Description BLOOD RIGHT ANTECUBITAL  Final   Special Requests   Final    BOTTLES DRAWN AEROBIC AND ANAEROBIC Blood Culture adequate volume   Culture   Final    NO GROWTH 5 DAYS Performed at [facility], [address] [place], [place] [address]    Report Status 11/01/2020 FINAL  Final     Labs: BNP (last 3 results) No results for input(s): BNP in the last 8760 hours. Basic Metabolic Panel: Recent Labs  Lab 10/30/20 0455 10/31/20 0423 10/31/20 0428 11/01/20 0403 11/02/20 0323 11/03/20 0332 11/04/20 0343 11/05/20 0311  NA 137  --    < > 137 137 139 138 139  K 3.2*  --    < > 3.3* 3.9 3.6 3.7 3.3*  CL 106  --    < > 102 104 107 106 108  CO2 25  --    < > 28 27 27 26 26   GLUCOSE 80  --    < > 85 101* 77 80 113*  BUN 9  --    < > <5* <5* 5* 7 <5*  CREATININE 0.55  --    < > 0.42* 0.50 0.50 0.59 0.44  CALCIUM 7.5*  --    < > 7.5* 7.8* 7.9* 8.5* 8.3*  MG 1.8 1.8  --   --   --   --   --   --   PHOS  --  1.2*  --   --  1.9*  --   --   --    < > = values in this interval not displayed.   Liver Function Tests: No results for input(s): AST, ALT, ALKPHOS, BILITOT, PROT, ALBUMIN in the last 168 hours. No results for input(s): LIPASE, AMYLASE in the last 168 hours. No results for input(s): AMMONIA in the last 168 hours. CBC: Recent Labs  Lab 11/01/20 0403  11/02/20 0323 11/03/20 0332 11/04/20 0343 11/05/20 0311  WBC 6.9 10.0 7.7 11.7* 9.4  NEUTROABS 5.0 7.4 5.1 11.1* 7.4  HGB 8.4* 8.7* 8.1* 8.7* 8.1*  HCT 25.9* 27.5* 26.2* 28.2* 26.6*  MCV 82.2 82.8 84.8 85.7 86.9  PLT 269 328 318 326 237   Cardiac Enzymes: No results for input(s): CKTOTAL, CKMB, CKMBINDEX, TROPONINI in the last 168 hours. BNP: Invalid input(s): POCBNP CBG: Recent Labs  Lab 11/04/20 1148  GLUCAP 81   D-Dimer No results for input(s): DDIMER in the last 72 hours. Hgb A1c No results for input(s): HGBA1C in the last 72 hours. Lipid Profile No results for input(s): CHOL, HDL, LDLCALC, TRIG, CHOLHDL, LDLDIRECT in the last 72 hours. Thyroid function studies No results for input(s): TSH, T4TOTAL, T3FREE, THYROIDAB in the last 72 hours.  Invalid input(s): FREET3 Anemia work up Recent  Labs    11/04/20 1129  FOLATE 7.8   Urinalysis    Component Value Date/Time   COLORURINE AMBER (A) 10/27/2020 0420   APPEARANCEUR HAZY (A) 10/27/2020 0420   LABSPEC 1.019 10/27/2020 0420   PHURINE 6.0 10/27/2020 0420   GLUCOSEU NEGATIVE 10/27/2020 0420   HGBUR NEGATIVE 10/27/2020 0420   BILIRUBINUR NEGATIVE 10/27/2020 0420   KETONESUR 5 (A) 10/27/2020 0420   PROTEINUR 30 (A) 10/27/2020 0420   NITRITE NEGATIVE 10/27/2020 0420   LEUKOCYTESUR NEGATIVE 10/27/2020 0420   Sepsis Labs Invalid input(s): PROCALCITONIN,  WBC,  LACTICIDVEN Microbiology Recent Results (from the past 240 hour(s))  Resp Panel by RT-PCR (Flu A&B, Covid) Nasopharyngeal Swab     Status: None   Collection Time: 10/27/20 12:11 AM   Specimen: Nasopharyngeal Swab; Nasopharyngeal(NP) swabs in vial transport medium  Result Value Ref Range Status   SARS Coronavirus 2 by RT PCR NEGATIVE NEGATIVE Final    Comment: (NOTE) SARS-CoV-2 target nucleic acids are NOT DETECTED.  The SARS-CoV-2 RNA is generally detectable in upper respiratory specimens during the acute phase of infection. The lowest concentration of  SARS-CoV-2 viral copies this assay can detect is 138 copies/mL. A negative result does not preclude SARS-Cov-2 infection and should not be used as the sole basis for treatment or other patient management decisions. A negative result may occur with  improper specimen collection/handling, submission of specimen other than nasopharyngeal swab, presence of viral mutation(s) within the areas targeted by this assay, and inadequate number of viral copies(<138 copies/mL). A negative result must be combined with clinical observations, patient history, and epidemiological information. The expected result is Negative.  Fact Sheet for Patients:  EntrepreneurPulse.com.au  Fact Sheet for Healthcare Providers:  IncredibleEmployment.be  This test is no t yet approved or cleared by the [place] FDA and  has been authorized for detection and/or diagnosis of SARS-CoV-2 by FDA under an Emergency Use Authorization (EUA). This EUA will remain  in effect (meaning this test can be used) for the duration of the COVID-19 declaration under Section 564(b)(1) of the Act, 21 U.S.C.section 360bbb-3(b)(1), unless the authorization is terminated  or revoked sooner.       Influenza A by PCR NEGATIVE NEGATIVE Final   Influenza B by PCR NEGATIVE NEGATIVE Final    Comment: (NOTE) The Xpert Xpress SARS-CoV-2/FLU/RSV plus assay is intended as an aid in the diagnosis of influenza from Nasopharyngeal swab specimens and should not be used as a sole basis for treatment. Nasal washings and aspirates are unacceptable for Xpert Xpress SARS-CoV-2/FLU/RSV testing.  Fact Sheet for Patients: EntrepreneurPulse.com.au  Fact Sheet for Healthcare Providers: IncredibleEmployment.be  This test is not yet approved or cleared by the [place] FDA and has been authorized for detection and/or diagnosis of SARS-CoV-2 by FDA under an Emergency Use  Authorization (EUA). This EUA will remain in effect (meaning this test can be used) for the duration of the COVID-19 declaration under Section 564(b)(1) of the Act, 21 U.S.C. section 360bbb-3(b)(1), unless the authorization is terminated or revoked.  Performed at [facility], [place] [address] [place], [place] [address]   Blood Culture (routine x 2)     Status: None   Collection Time: 10/27/20  3:15 AM   Specimen: BLOOD  Result Value Ref Range Status   Specimen Description BLOOD LEFT ARM  Final   Special Requests   Final    BOTTLES DRAWN AEROBIC AND ANAEROBIC Blood Culture results may not be optimal due to an inadequate volume of  blood received in culture bottles   Culture   Final    NO GROWTH 5 DAYS Performed at [facility], [place] [address] [place], [place] [address]    Report Status 11/01/2020 FINAL  Final  Urine culture     Status: Abnormal   Collection Time: 10/27/20  4:20 AM   Specimen: In/Out Cath Urine  Result Value Ref Range Status   Specimen Description IN/OUT CATH URINE  Final   Special Requests   Final    NONE Performed at [facility], [place] [address] [place], [place] [address]    Culture 30,000 COLONIES/mL PSEUDOMONAS AERUGINOSA (A)  Final   Report Status 10/29/2020 FINAL  Final   Organism ID, Bacteria PSEUDOMONAS AERUGINOSA (A)  Final      Susceptibility   Pseudomonas aeruginosa - MIC*    CEFTAZIDIME 2 SENSITIVE Sensitive     CIPROFLOXACIN <=0.25 SENSITIVE Sensitive     GENTAMICIN <=1 SENSITIVE Sensitive     IMIPENEM 0.5 SENSITIVE Sensitive     PIP/TAZO <=4 SENSITIVE Sensitive     CEFEPIME 2 SENSITIVE Sensitive     * 30,000 COLONIES/mL PSEUDOMONAS AERUGINOSA  Blood Culture (routine x 2)     Status: None   Collection Time: 10/27/20  9:38 AM   Specimen: BLOOD  Result Value Ref Range Status   Specimen Description BLOOD RIGHT ANTECUBITAL  Final   Special Requests   Final    BOTTLES DRAWN AEROBIC AND ANAEROBIC Blood Culture adequate volume    Culture   Final    NO GROWTH 5 DAYS Performed at [facility], [address], [place], [place] [address]    Report Status 11/01/2020 FINAL  Final     Time coordinating discharge: Over 30 minutes  SIGNED:   Charlynne Cousins, MD  [facility] 11/05/2020, 9:44 AM Pager   If 7PM-7AM, please contact night-coverage [email or web address] Password [email or web address]

## 2020-11-06 DIAGNOSIS — J189 Pneumonia, unspecified organism: Secondary | ICD-10-CM | POA: Diagnosis not present

## 2020-11-06 LAB — CBC WITH DIFFERENTIAL/PLATELET
Abs Immature Granulocytes: 0.29 10*3/uL — ABNORMAL HIGH (ref 0.00–0.07)
Basophils Absolute: 0 10*3/uL (ref 0.0–0.1)
Basophils Relative: 0 %
Eosinophils Absolute: 0 10*3/uL (ref 0.0–0.5)
Eosinophils Relative: 0 %
HCT: 26.4 % — ABNORMAL LOW (ref 36.0–46.0)
Hemoglobin: 8.1 g/dL — ABNORMAL LOW (ref 12.0–15.0)
Immature Granulocytes: 3 %
Lymphocytes Relative: 9 %
Lymphs Abs: 0.8 10*3/uL (ref 0.7–4.0)
MCH: 26.2 pg (ref 26.0–34.0)
MCHC: 30.7 g/dL (ref 30.0–36.0)
MCV: 85.4 fL (ref 80.0–100.0)
Monocytes Absolute: 0.5 10*3/uL (ref 0.1–1.0)
Monocytes Relative: 6 %
Neutro Abs: 7.5 10*3/uL (ref 1.7–7.7)
Neutrophils Relative %: 82 %
Platelets: 216 10*3/uL (ref 150–400)
RBC: 3.09 MIL/uL — ABNORMAL LOW (ref 3.87–5.11)
RDW: 16.5 % — ABNORMAL HIGH (ref 11.5–15.5)
WBC: 9.1 10*3/uL (ref 4.0–10.5)
nRBC: 0 % (ref 0.0–0.2)

## 2020-11-06 LAB — BASIC METABOLIC PANEL
Anion gap: 4 — ABNORMAL LOW (ref 5–15)
BUN: <5 mg/dL — ABNORMAL LOW (ref 6–20)
CO2: 26 mmol/L (ref 22–32)
Calcium: 8 mg/dL — ABNORMAL LOW (ref 8.9–10.3)
Chloride: 108 mmol/L (ref 98–111)
Creatinine, Ser: 0.42 mg/dL — ABNORMAL LOW (ref 0.44–1.00)
GFR, Estimated: >60 mL/min (ref >60)
Glucose, Bld: 131 mg/dL — ABNORMAL HIGH (ref 70–99)
Potassium: 3.1 mmol/L — ABNORMAL LOW (ref 3.5–5.1)
Sodium: 138 mmol/L (ref 135–145)

## 2020-11-06 LAB — VITAMIN C: Vitamin C: 0.6 mg/dL (ref 0.4–2.0)

## 2020-11-06 LAB — COPPER, SERUM: Copper: 24 ug/dL — ABNORMAL LOW (ref 80–158)

## 2020-11-06 MED ORDER — ACD FORMULA A 0.73-2.45-2.2 GM/100ML VI SOLN
1000.0000 mL | Status: DC
  Filled 2020-11-06: qty 1000

## 2020-11-06 MED ORDER — CALCIUM GLUCONATE 10 % IV SOLN
2.0000 g | Freq: Once | INTRAVENOUS | Status: DC
  Filled 2020-11-06: qty 20

## 2020-11-06 MED ORDER — ACETAMINOPHEN 325 MG PO TABS: 650.0000 mg | ORAL_TABLET | ORAL | Status: DC | PRN

## 2020-11-06 MED ORDER — SODIUM CHLORIDE 0.9 % IV SOLN
INTRAVENOUS | Status: DC
  Filled 2020-11-06 (×3): qty 200

## 2020-11-06 MED ORDER — ACD FORMULA A 0.73-2.45-2.2 GM/100ML VI SOLN
Status: AC
  Filled 2020-11-06: qty 500

## 2020-11-06 MED ORDER — LORAZEPAM 1 MG PO TABS: 1.0000 mg | ORAL_TABLET | Freq: Once | ORAL | Status: DC

## 2020-11-06 MED ORDER — POTASSIUM CHLORIDE CRYS ER 20 MEQ PO TBCR
40.0000 meq | EXTENDED_RELEASE_TABLET | Freq: Three times a day (TID) | ORAL | Status: DC
  Filled 2020-11-06: qty 2

## 2020-11-06 MED ORDER — ANTICOAGULANT SODIUM CITRATE 4% (200MG/5ML) IV SOLN
5.0000 mL | Freq: Once | Status: DC
  Administered 2020-11-06: 5 mL
  Filled 2020-11-06: qty 5

## 2020-11-06 MED ORDER — POTASSIUM CHLORIDE 10 MEQ/100ML IV SOLN
10.0000 meq | INTRAVENOUS | Status: AC
  Administered 2020-11-06 (×5): 10 meq via INTRAVENOUS
  Filled 2020-11-06 (×6): qty 100

## 2020-11-06 MED ORDER — CALCIUM GLUCONATE-NACL 2-0.675 GM/100ML-% IV SOLN
2.0000 g | Freq: Once | INTRAVENOUS | Status: DC
  Administered 2020-11-06: 2000 mg via INTRAVENOUS
  Filled 2020-11-06: qty 100

## 2020-11-06 MED ORDER — DIPHENHYDRAMINE HCL 50 MG/ML IJ SOLN: 25.0000 mg | Freq: Once | INTRAMUSCULAR | Status: AC

## 2020-11-06 MED ORDER — DIPHENHYDRAMINE HCL 25 MG PO CAPS: 25.0000 mg | ORAL_CAPSULE | Freq: Four times a day (QID) | ORAL | Status: DC | PRN

## 2020-11-06 MED ORDER — DIPHENHYDRAMINE HCL 50 MG/ML IJ SOLN
INTRAMUSCULAR | Status: AC
  Administered 2020-11-06: 25 mg via INTRAVENOUS
  Filled 2020-11-06: qty 1

## 2020-11-06 NOTE — Progress Notes (Signed)
NIF -25 with good effort. 

## 2020-11-06 NOTE — TOC Transition Note (Signed)
Transition of Care [facility]) - CM/SW Discharge Note   Patient Details  Name: Brandy [name] MRN: [id number] Date of Birth: 12-02-1990  Transition of Care [facility]) CM/SW Contact:  Lockie Pares, RN Phone Number: 11/06/2020, 1:19 PM   Clinical Narrative:    Spoke to patient regarding treatment and transportaiton called [employer] and received number for transportation, however, the patient indicated often they do not send, making her miss appointments. [employer] more reliable. She gave me numbers to call for them as well . She had some concerns about provisions of perfumes and scented lotions, she has ana[halxis, tthis will be forwarded to leadership for review, as well as a concern regarding her dietary needs. She has expressed wanting social work and SLP to home health, they are added [facility] called ready for DC her wheelchair is at home.    Final next level of care: Home/Self Care Barriers to Discharge: No Barriers Identified   Patient Goals and CMS Choice Patient states their goals for this hospitalization and ongoing recovery are:: go home   Choice offered to / list presented to :  (Patient did not have home health preference.)  Discharge Placement             Home with home health          Discharge Plan and Services In-house Referral: Clinical Social Work Discharge Planning Services: CM Consult Post Acute Care Choice: Home Health          DME Arranged: N/A DME Agency: NA       HH Arranged: RN,Disease Management,PT HH Agency: [facility] Date [facility] Agency Contacted: 11/05/20 Time HH Agency Contacted: 1650 Representative spoke with at [facility] Agency: Kandee Keen  Social Determinants of Health (SDOH) Interventions     Readmission Risk Interventions Readmission Risk Prevention Plan 11/05/2020  Transportation Screening Complete  Medication Review [employer]) Complete  HRI or Home Care Consult Complete  SW Recovery Care/Counseling Consult Complete  Palliative Care  Screening Not Applicable  Skilled Nursing Facility Not Applicable

## 2020-11-06 NOTE — Progress Notes (Signed)
NIF -20 with good effort.

## 2020-11-06 NOTE — Progress Notes (Signed)
[facility] PROGRESS NOTE    Progress Note  Brandy [name]  ZOX:[id number] DOB: 11/05/1990 DOA: 10/26/2020 PCP: Oneita Hurt, No     Brief Narrative:   Brandy [name] is an 29 y.o. 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  He relates that the symptoms started about 2 weeks prior to admission.  Diagnosed with bilateral lower lobe pneumonia and abdominal wall cellulitis surrounding the PEG tube site  No changes overnight patient cannot be discharged as a transportation was not arranged.  Assessment/Plan:   Bilateral lower lobe pneumonia: Blood cultures have remained negative till date she has completed her course of IV antibiotics in house. Currently on room air.  Possible abdominal wall cellulitis around the PEG tube site: Now resolved. She reported purulent drainage from her PEG tube with erythema on admission.  Was consulted and her PEG tube.  The patient wanted her PEG tube change but that specific PEG tube she is desiring is not available here at home plan for outpatient [facility] follow-up for PEG tube exchange. Patient is tolerating her diet by mouth.  Possible UTI: She completed her course of cefepime, urine culture grew 30,000 colonies [place] Pseudomonas aeruginosa (patient is immunosuppressed).  Possible myasthenia gravis flare: Neurology was consulted they recommended Plex treatment on 11/04/2020, she had a catheter placed on 11/03/2020. Continue Mestinon and physical therapy. Neurology related that if she tolerates her HD she can be discharged in finish her further Plex treatment as an outpatient.  Hypokalemia/hypophosphatemia: Mildly low today repleted IV.  Patient adamantly refused oral tablets or liquid she relates that she needs a special kind of potassium that they sell as an  outpatient which is very expensive that that she only when she tolerates.  Anemia of chronic disease: With a baseline creatinine of around 9-11, check CBCs regularly.  Mast cell activation syndrome: Continue home prednisone, did call in Pepcid. With this condition she is at high risk of anaphylaxis reaction we will continue to monitor saturations closely.  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  Severe protein caloric malnutrition: Dietitian has been consulted, they have provided her freezer in her room due to multiple allergies, we are working getting rice and lentils from  food services.  DVT prophylaxis: lovenxo Family Communication:none Status is: Inpatient  Remains inpatient appropriate because:Hemodynamically unstable   Dispo: The patient is from: Home              Anticipated d/c is to: Home              Patient currently is not medically stable to d/c.   Difficult to place patient No        Code Status:     Code Status Orders  (From admission, onward)         Start     Ordered   10/27/20 0612  Full code  Continuous        10/27/20 0611        Code Status History    Date Active Date Inactive Code Status Order ID Comments User Context   07/10/2020 0118 07/12/2020 0506 Full Code [id number]  John Giovanni, MD ED   05/16/2020 1012 05/22/2020 0604 Full Code [id number]  Lorin Glass, MD ED   Advance Care Planning Activity        IV Access:    Peripheral IV  Procedures and diagnostic studies:   No results found.   Medical Consultants:    None.   Subjective:    Brandy [name] patient is complaining that her room is too cold.  Objective:    Vitals:   11/05/20 1100 11/05/20 2113 11/05/20 2125 11/06/20 0534  BP: 127/75  117/76 109/77  Pulse: 65  68 63  Resp: 16  17 18   Temp: 97.9 F (36.6 C)  97.6 F (36.4 C) 98.1 F (36.7 C)  TempSrc: Oral  Oral Oral  SpO2: 100% 100% 100% 100%  Weight:      Height:        SpO2: 100 % O2 Flow Rate (L/min): 2 L/min FiO2 (%): 28 %   Intake/Output Summary (Last 24 hours) at 11/06/2020 0849 Last data filed at 11/05/2020 2300 Gross per 24 hour  Intake 0 ml  Output 1300 ml  Net -1300 ml   Filed Weights   10/28/20 1400 11/04/20 1250  Weight: 49 kg 44.3 kg    Exam: General exam: She refused physical exam   Data Reviewed:    Labs: Basic Metabolic Panel: Recent Labs  Lab 10/31/20 0423 10/31/20 0428 11/02/20 0323 11/03/20 0332 11/04/20 0343 11/05/20 0311 11/06/20 0353  NA  --    < > 137 139 138 139 138  K  --    < > 3.9 3.6 3.7 3.3* 3.1*  CL  --    < > 104 107 106 108 108  CO2  --    < > 27 27 26 26 26   GLUCOSE  --    < > 101* 77 80 113* 131*  BUN  --    < > <5* 5* 7 <5* <5*  CREATININE  --    < > 0.50 0.50 0.59 0.44 0.42*  CALCIUM  --    < > 7.8* 7.9* 8.5* 8.3* 8.0*  MG 1.8  --   --   --   --   --   --   PHOS 1.2*  --  1.9*  --   --   --   --    < > = values in this interval not displayed.   GFR Estimated Creatinine Clearance: 72.6 mL/min (A) (by C-G formula based on SCr of 0.42 mg/dL (L)). Liver Function Tests: No results for input(s): AST, ALT, ALKPHOS, BILITOT, PROT, ALBUMIN in the last 168 hours. No results for input(s): LIPASE, AMYLASE in the last 168 hours. No results for input(s): AMMONIA in the last 168 hours. Coagulation profile No results for input(s): INR, PROTIME in the last 168 hours. COVID-19 Labs  No results for input(s): DDIMER, FERRITIN, LDH, CRP in the last 72 hours.  Lab Results  Component Value Date   SARSCOV2NAA NEGATIVE 10/27/2020   SARSCOV2NAA NEGATIVE 07/09/2020   SARSCOV2NAA NEGATIVE 05/16/2020    CBC: Recent Labs  Lab 11/02/20 0323 11/03/20 0332 11/04/20 0343 11/05/20 0311 11/06/20 0353  WBC 10.0 7.7 11.7* 9.4 9.1  NEUTROABS 7.4 5.1 11.1* 7.4 7.5  HGB 8.7* 8.1* 8.7* 8.1* 8.1*  HCT 27.5* 26.2* 28.2* 26.6* 26.4*  MCV 82.8 84.8 85.7 86.9 85.4  PLT 328 318 326 237 216   Cardiac Enzymes: No  results for input(s): CKTOTAL, CKMB, CKMBINDEX, TROPONINI in the last 168 hours. BNP (last 3 results) No results for input(s): PROBNP in the last 8760 hours. CBG: Recent Labs  Lab 11/04/20 1148  GLUCAP 81   D-Dimer: No results for input(s): DDIMER in the last 72 hours. Hgb A1c:  No results for input(s): HGBA1C in the last 72 hours. Lipid Profile: No results for input(s): CHOL, HDL, LDLCALC, TRIG, CHOLHDL, LDLDIRECT in the last 72 hours. Thyroid function studies: No results for input(s): TSH, T4TOTAL, T3FREE, THYROIDAB in the last 72 hours.  Invalid input(s): FREET3 Anemia work up: Recent Labs    11/04/20 1129  FOLATE 7.8   Sepsis Labs: Recent Labs  Lab 11/03/20 0332 11/04/20 0343 11/05/20 0311 11/06/20 0353  WBC 7.7 11.7* 9.4 9.1   Microbiology Recent Results (from the past 240 hour(s))  Blood Culture (routine x 2)     Status: None   Collection Time: 10/27/20  9:38 AM   Specimen: BLOOD  Result Value Ref Range Status   Specimen Description BLOOD RIGHT ANTECUBITAL  Final   Special Requests   Final    BOTTLES DRAWN AEROBIC AND ANAEROBIC Blood Culture adequate volume   Culture   Final    NO GROWTH 5 DAYS Performed at [facility], [address] [place], [place] [address]    Report Status 11/01/2020 FINAL  Final     Medications:   . acetaminophen  1,000 mg Oral Once  . baclofen  10 mg Oral QID  . calcium gluconate  2 g Intravenous Once  . cromolyn  200 mg Oral TID AC & HS  . cromolyn  20 mg Nebulization TID  . enoxaparin (LOVENOX) injection  40 mg Subcutaneous Q24H  . famotidine  40 mg Oral BID  . fluticasone-salmeterol  1 puff Inhalation BID  . ipratropium-albuterol  3 mL Inhalation TID  . ivabradine  5 mg Oral BID  . levocetirizine  5 mg Oral QID  . predniSONE  20 mg Oral Daily  . pyridostigmine  90 mg Oral Q3H  . sodium chloride flush  10-40 mL Intracatheter Q12H   Continuous Infusions: . sodium chloride 100 mL/hr at 11/06/20 0500  .  therapeutic plasma exchange solution    . anticoagulant sodium citrate    . calcium gluconate    . citrate dextrose    . potassium chloride        LOS: 10 days   Marinda Elk  [facility]  11/06/2020, 8:49 AM

## 2020-11-06 NOTE — Care Management (Addendum)
[facility] wheelchair accessible Zenaida Niece scheduled for 830 a pickup  From home to [employer] at 110 pm l front enterance for patient to register for plasmaphoresis on the following dates  May 23, May 25, May 27 , May 30 and November 18 2020.  It was noted to the scheduler that no perfumes, or scents, lotions can be used in transport. Patient notified of this. Attempted to schedule with [facility] [employer] first, was unsuccessful.  PCP is Lamont Dowdy  Called patient and told her about [facility] and the times of drop off and pick up.

## 2020-11-07 LAB — MUSK ANTIBODIES: MuSK Antibodies: <1 U/mL

## 2020-11-09 ENCOUNTER — Non-Acute Institutional Stay (HOSPITAL_COMMUNITY)
Admission: RE | Admit: 2020-11-09 | Discharge: 2020-11-09 | Disposition: A | Discharge status: Home / Self Care | Payer: 59 | Source: Ambulatory Visit | Attending: Student in an Organized Health Care Education/Training Program | Admitting: Student in an Organized Health Care Education/Training Program

## 2020-11-09 ENCOUNTER — Ambulatory Visit (INDEPENDENT_AMBULATORY_CARE_PROVIDER_SITE_OTHER): Payer: 59 | Admitting: Family Medicine

## 2020-11-09 DIAGNOSIS — Z713 Dietary counseling and surveillance: Secondary | ICD-10-CM | POA: Diagnosis not present

## 2020-11-09 DIAGNOSIS — E43 Unspecified severe protein-calorie malnutrition: Secondary | ICD-10-CM

## 2020-11-09 DIAGNOSIS — G7000 Myasthenia gravis without (acute) exacerbation: Secondary | ICD-10-CM | POA: Diagnosis not present

## 2020-11-09 LAB — BASIC METABOLIC PANEL
Anion gap: 7 (ref 5–15)
BUN: <5 mg/dL — ABNORMAL LOW (ref 6–20)
CO2: 24 mmol/L (ref 22–32)
Calcium: 7.7 mg/dL — ABNORMAL LOW (ref 8.9–10.3)
Chloride: 110 mmol/L (ref 98–111)
Creatinine, Ser: 0.5 mg/dL (ref 0.44–1.00)
GFR, Estimated: >60 mL/min (ref >60)
Glucose, Bld: 122 mg/dL — ABNORMAL HIGH (ref 70–99)
Potassium: 2.9 mmol/L — ABNORMAL LOW (ref 3.5–5.1)
Sodium: 141 mmol/L (ref 135–145)

## 2020-11-09 LAB — CBC
HCT: 24.4 % — ABNORMAL LOW (ref 36.0–46.0)
Hemoglobin: 7.4 g/dL — ABNORMAL LOW (ref 12.0–15.0)
MCH: 26.6 pg (ref 26.0–34.0)
MCHC: 30.3 g/dL (ref 30.0–36.0)
MCV: 87.8 fL (ref 80.0–100.0)
Platelets: 171 10*3/uL (ref 150–400)
RBC: 2.78 MIL/uL — ABNORMAL LOW (ref 3.87–5.11)
RDW: 18 % — ABNORMAL HIGH (ref 11.5–15.5)
WBC: 10.3 10*3/uL (ref 4.0–10.5)
nRBC: 0 % (ref 0.0–0.2)

## 2020-11-09 MED ORDER — HEPARIN SODIUM (PORCINE) 1000 UNIT/ML IJ SOLN: 1000.0000 [IU] | Freq: Once | INTRAMUSCULAR | Status: DC

## 2020-11-09 MED ORDER — ACD FORMULA A 0.73-2.45-2.2 GM/100ML VI SOLN
Status: AC
  Administered 2020-11-09: 1000 mL
  Filled 2020-11-09: qty 500

## 2020-11-09 MED ORDER — ANTICOAGULANT SODIUM CITRATE 4% (200MG/5ML) IV SOLN
5.0000 mL | Freq: Once | Status: DC
  Filled 2020-11-09: qty 5

## 2020-11-09 MED ORDER — ACD FORMULA A 0.73-2.45-2.2 GM/100ML VI SOLN: 1000.0000 mL | Status: DC

## 2020-11-09 MED ORDER — CALCIUM GLUCONATE-NACL 2-0.675 GM/100ML-% IV SOLN
INTRAVENOUS | Status: AC
  Administered 2020-11-09: 2000 mg via INTRAVENOUS
  Filled 2020-11-09: qty 100

## 2020-11-09 MED ORDER — DIPHENHYDRAMINE HCL 50 MG/ML IJ SOLN: 25.0000 mg | Freq: Once | INTRAMUSCULAR | Status: AC

## 2020-11-09 MED ORDER — DIPHENHYDRAMINE HCL 50 MG/ML IJ SOLN
INTRAMUSCULAR | Status: AC
  Administered 2020-11-09: 25 mg via INTRAVENOUS
  Filled 2020-11-09: qty 1

## 2020-11-09 MED ORDER — ACETAMINOPHEN 325 MG PO TABS: 650.0000 mg | ORAL_TABLET | ORAL | Status: DC | PRN

## 2020-11-09 MED ORDER — CALCIUM GLUCONATE-NACL 2-0.675 GM/100ML-% IV SOLN: 2.0000 g | Freq: Once | INTRAVENOUS | Status: AC

## 2020-11-09 MED ORDER — SODIUM CHLORIDE 0.9 % IV SOLN
INTRAVENOUS | Status: AC
  Filled 2020-11-09 (×3): qty 200

## 2020-11-09 MED ORDER — CALCIUM GLUCONATE 10 % IV SOLN: 2.0000 g | Freq: Once | INTRAVENOUS | Status: DC

## 2020-11-09 MED ORDER — DIPHENHYDRAMINE HCL 25 MG PO CAPS: 25.0000 mg | ORAL_CAPSULE | Freq: Four times a day (QID) | ORAL | Status: DC | PRN

## 2020-11-09 NOTE — Patient Instructions (Signed)
Email an update on your medical progress as well as your nutrition concerns and questions prior to next appt.    Follow-up:Telehealth appt on Monday, June 13 at 10 AM.  I will call you if I have a cancellation for any appts available before 3 PM before that time.

## 2020-11-09 NOTE — Progress Notes (Signed)
Telehealth Encounter (TEXT link [phone] ) PCP Brandy Muse, MD  I connected with Brandy [name] (MRN [id number]) on 11/09/2020 by MyChart video-enabled, HIPAA-compliant telemedicine application, verified that I was speaking with the correct person using two identifiers, and that the patient was in a private environment conducive to confidentiality.  The patient agreed to proceed.   Persons participating in visit were patient and provider (registered dietitian) Brandy Center, PhD, RD, LDN, CEDRD.  Provider was located at [place] during this telehealth encounter; patient was at dialysis.  Appt start time: 1100 end time: 1200 (1 hour)  Reason for telehealth visit: Referred upon hospital discharge by RD Brandy [name] for Medical Nutrition Therapy related to food sensitivities and eating behavior concerns. Relevant history/background: Medical hx includes:  . Asthma  . Cervical spine instability  . Chronic PTSD  . Dysautonomia    . Fibromyalgia  . GERD with hiatal hernia  . Hypermobile Ehlers-Danlos syndrome  . IBS (irritable bowel syndrome)  . Malabsorption  . 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 Myasthenia gravis . POTS (postural orthostatic tachycardia syndrome)  . Seasonal allergies  . 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 POTS.  For a comprehensive list of symptoms, see Dr. Gwenyth [name] encounter notes from 06/16/20.  Symptoms most relevant to nutrition concerns include nausea, diarrhea, constipation, heart burn, abdominal bloating and cramping, feeling full quickly after eating, difficulty swallowing, weight loss, and bad breath.  Due to fatigue, light-headedness, and syncope, patient is wheelchair-bound.  Brandy [name] reports numerous food allergies/intolerances in addition to the 42  drug-related allergies documented in EMR.   Brandy [name] RD Brandy [name] during her hospitalization in November 2021.  Nutrition concerns at that time included severe malnutrition, confirmed by labs showing low K+, Ca+, BUN, total protein, alk phos, AST, hgb, hct, ferritin, RBC, vit's A and D, and Cu.  WNR were vit's B6, B12, folate, thiamin, vit C, and Zn.  Brandy [name] had in place a J-tube, but used primarily a G-tube for tube feeding while hospitalized, which she continues at home.  She rejected all tube feeding options on the hospital's formulary (including [employer]), using instead her own TF blend, which typically includes celery, pears, rutabaga, rice, lentils, and water of which she gets at most 138m /day.  She consumes PO a limited range of foods such as dried beans made from scratch, homemade cassava root chips, Brussels sprouts, rice crisps, rice pasta, and steamed cabbage.    Assessment: When encounter was initiated today, [name] having plasmapheresis.  She was hospitalized for 10 days in early May for myasthenia gravis flare, pneumonia, and problems with her feeding tube.  She was unable to keep her GI appt during hospitalization, so has not seen Brandy [name] recently.  Only spoke briefly today d/t pt's current situation and difficulty speaking through respirator mask.  Set F/U remote appt, and patient agreed to email in advance of appt her concerns, questions, and update.    For recommendations and goals, see Patient Instructions.    Follow-up: Telehealth appt Monday, June 13 at 10 AM.    Brandy [name]

## 2020-11-09 NOTE — Progress Notes (Signed)
Therapeutic Plasma Exchange complete. Patient departed unit via wheelchair by this RN to a transport van vitals stable, alert, oriented w/o complaint.

## 2020-11-10 LAB — VITAMIN B6: Vitamin B6: 1.5 ug/L — ABNORMAL LOW (ref 3.4–65.2)

## 2020-11-11 ENCOUNTER — Ambulatory Visit (HOSPITAL_COMMUNITY): Admission: RE | Admit: 2020-11-11 | Payer: 59 | Source: Home / Self Care

## 2020-11-11 ENCOUNTER — Encounter (HOSPITAL_COMMUNITY): Payer: Self-pay

## 2020-11-11 ENCOUNTER — Emergency Department (HOSPITAL_COMMUNITY)
Admission: EM | Admit: 2020-11-11 | Discharge: 2020-11-11 | Disposition: A | Discharge status: Home / Self Care | Payer: 59 | Attending: Emergency Medicine | Admitting: Emergency Medicine

## 2020-11-11 ENCOUNTER — Ambulatory Visit (HOSPITAL_COMMUNITY)
Admission: RE | Admit: 2020-11-11 | Discharge: 2020-11-11 | Disposition: A | Discharge status: Home / Self Care | Payer: 59 | Source: Home / Self Care

## 2020-11-11 ENCOUNTER — Other Ambulatory Visit: Payer: Self-pay

## 2020-11-11 DIAGNOSIS — B379 Candidiasis, unspecified: Secondary | ICD-10-CM

## 2020-11-11 DIAGNOSIS — Z87891 Personal history of nicotine dependence: Secondary | ICD-10-CM | POA: Diagnosis not present

## 2020-11-11 DIAGNOSIS — Z789 Other specified health status: Secondary | ICD-10-CM | POA: Insufficient documentation

## 2020-11-11 DIAGNOSIS — K9422 Gastrostomy infection: Secondary | ICD-10-CM | POA: Diagnosis present

## 2020-11-11 LAB — RENAL FUNCTION PANEL
Albumin: 3.3 g/dL — ABNORMAL LOW (ref 3.5–5.0)
Anion gap: 9 (ref 5–15)
BUN: <5 mg/dL — ABNORMAL LOW (ref 6–20)
CO2: 28 mmol/L (ref 22–32)
Calcium: 7.8 mg/dL — ABNORMAL LOW (ref 8.9–10.3)
Chloride: 107 mmol/L (ref 98–111)
Creatinine, Ser: 0.6 mg/dL (ref 0.44–1.00)
GFR, Estimated: >60 mL/min (ref >60)
Glucose, Bld: 87 mg/dL (ref 70–99)
Phosphorus: 2.9 mg/dL (ref 2.5–4.6)
Potassium: 2.5 mmol/L — CL (ref 3.5–5.1)
Sodium: 144 mmol/L (ref 135–145)

## 2020-11-11 LAB — CBC
HCT: 25.5 % — ABNORMAL LOW (ref 36.0–46.0)
Hemoglobin: 8 g/dL — ABNORMAL LOW (ref 12.0–15.0)
MCH: 27.2 pg (ref 26.0–34.0)
MCHC: 31.4 g/dL (ref 30.0–36.0)
MCV: 86.7 fL (ref 80.0–100.0)
Platelets: 131 10*3/uL — ABNORMAL LOW (ref 150–400)
RBC: 2.94 MIL/uL — ABNORMAL LOW (ref 3.87–5.11)
RDW: 19.2 % — ABNORMAL HIGH (ref 11.5–15.5)
WBC: 9.7 10*3/uL (ref 4.0–10.5)
nRBC: 0 % (ref 0.0–0.2)

## 2020-11-11 MED ORDER — ACETAMINOPHEN 325 MG PO TABS: 650.0000 mg | ORAL_TABLET | ORAL | Status: DC | PRN

## 2020-11-11 MED ORDER — DIPHENHYDRAMINE HCL 25 MG PO CAPS: 25.0000 mg | ORAL_CAPSULE | Freq: Four times a day (QID) | ORAL | Status: DC | PRN

## 2020-11-11 MED ORDER — FLUCONAZOLE 150 MG PO TABS
150.0000 mg | ORAL_TABLET | Freq: Once | ORAL | Status: AC
  Administered 2020-11-11: 150 mg via ORAL
  Filled 2020-11-11: qty 1

## 2020-11-11 MED ORDER — CALCIUM GLUCONATE-NACL 2-0.675 GM/100ML-% IV SOLN: 2.0000 g | Freq: Once | INTRAVENOUS | Status: DC

## 2020-11-11 MED ORDER — NYSTATIN 100000 UNIT/GM EX CREA: TOPICAL_CREAM | CUTANEOUS | 0 refills | Status: AC

## 2020-11-11 MED ORDER — SODIUM CHLORIDE 0.9 % IV SOLN
INTRAVENOUS | Status: AC
  Filled 2020-11-11 (×3): qty 200

## 2020-11-11 MED ORDER — ACD FORMULA A 0.73-2.45-2.2 GM/100ML VI SOLN: 1000.0000 mL | Status: DC

## 2020-11-11 MED ORDER — CALCIUM GLUCONATE-NACL 2-0.675 GM/100ML-% IV SOLN
INTRAVENOUS | Status: AC
  Administered 2020-11-11: 2 mg
  Filled 2020-11-11: qty 100

## 2020-11-11 MED ORDER — ANTICOAGULANT SODIUM CITRATE 4% (200MG/5ML) IV SOLN
5.0000 mL | Freq: Once | Status: AC
  Administered 2020-11-11: 5 mL
  Filled 2020-11-11: qty 5

## 2020-11-11 MED ORDER — HEPARIN SODIUM (PORCINE) 1000 UNIT/ML IJ SOLN: 1000.0000 [IU] | Freq: Once | INTRAMUSCULAR | Status: DC

## 2020-11-11 MED ORDER — DIPHENHYDRAMINE HCL 50 MG/ML IJ SOLN
INTRAMUSCULAR | Status: AC
  Administered 2020-11-11: 25 mg
  Filled 2020-11-11: qty 1

## 2020-11-11 NOTE — ED Notes (Signed)
This RN received verbal order from Dr. Freida Busman to access the patient's port

## 2020-11-11 NOTE — ED Triage Notes (Signed)
Patient is here requesting home health referral after recent hospitalization. The company that came out is unable to care for patient, they told patient she is too complex. Patient requesting port be accessed for fluids due to her malabsorption.

## 2020-11-11 NOTE — ED Provider Notes (Signed)
[facility] EMERGENCY DEPARTMENT Provider Note   CSN: [id number] Arrival date & time: 11/11/20  1311     History No chief complaint on file.   Brandy [name] is a 30 y.o. female.  30 year old female presents requesting access to her port.  States that she was discharged from the hospital yesterday and that her home health team today was unable to access report.  She does self infusions at home.  She is requesting also fluconazole for a yeast infection as well as topical medication for a yeast infection around her G-tube site.  She has no other complaints at this time        Past Medical History:  Diagnosis Date  . Dysautonomia (HCC) 06/28/2020  . Ehlers-Danlos syndrome 06/28/2020  . 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 Myasthenia gravis (HCC)   . POTS (postural orthostatic tachycardia syndrome) 06/28/2020  . Severe protein-calorie malnutrition (HCC) 05/20/2020    Patient Active Problem List   Diagnosis Date Noted  . Pressure injury of skin 10/31/2020  . SIRS (systemic inflammatory response syndrome) (HCC) 10/27/2020  . Pneumonia of both lower lobes due to infectious organism 10/27/2020  . Pain around PEG tube site 10/27/2020  . Hyponatremia 10/27/2020  . History of poor personal hygiene 10/27/2020  . Fever 10/27/2020  . Feeding tube dysfunction 07/10/2020  . 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 Ehlers-Danlos syndrome 06/28/2020  . Uses feeding tube 06/28/2020  . Restricted diet 06/28/2020  . 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 Chronic idiopathic urticaria 06/28/2020  . POTS (postural orthostatic tachycardia syndrome) 06/28/2020  . Dysautonomia (HCC) 06/28/2020  . PTSD (post-traumatic stress disorder) 06/28/2020  . GAD (generalized anxiety disorder) 06/28/2020  . History of major depression 06/28/2020  . Absolute anemia 06/28/2020  . On home oxygen therapy 06/28/2020   . Fibromyalgia 06/28/2020  . Myasthenia gravis (HCC) 05/23/2020  . Severe protein-calorie malnutrition (HCC) 05/20/2020  . Myasthenia exacerbation (HCC) 05/16/2020    Past Surgical History:  Procedure Laterality Date  . IR FLUORO GUIDE CV LINE LEFT  11/03/2020  . IR GJ TUBE CHANGE  07/11/2020  . IR REMOVAL TUN CV CATH W/O FL  06/03/2020  . IR US GUIDE VASC ACCESS LEFT  11/03/2020     OB History   No obstetric history on file.     Family History  Adopted: Yes    Social History   Tobacco Use  . Smoking status: Former Games developer  . Smokeless tobacco: Never Used    Home Medications Prior to Admission medications   Medication Sig Start Date End Date Taking? Authorizing Provider  albuterol (VENTOLIN HFA) 108 (90 Base) MCG/ACT inhaler Inhale into the lungs every 6 (six) hours as needed for wheezing or shortness of breath.    [provider]  baclofen (LIORESAL) 10 MG tablet Take 1 tablet (10 mg total) by mouth See admin instructions. Take one 10 mg tablet four times a day at 6AM, 12PM, 6PM, and 12AM 07/11/20   Westley Chandler, MD  CORLANOR 5 MG TABS tablet Take 1 tablet (5 mg total) by mouth 2 (two) times daily. 6am,6pm 07/11/20   Westley Chandler, MD  cromolyn (GASTROCROM) 100 MG/5ML solution Take 100 mg by mouth See admin instructions. 2 ampule in water taken 4 times a day before meals/bed and PRN    [provider]  cromolyn (INTAL) 20 MG/2ML nebulizer solution Take 20 mg by nebulization See admin instructions.  1 vial taken 3 times a day and PRN    [provider]  diphenhydrAMINE-zinc acetate (BENADRYL) cream Apply 1-2 application topically daily as needed (Rash from Mast Cell and Allergies).    [provider]  EPINEPHrine 0.3 mg/0.3 mL IJ SOAJ injection Inject 0.3 mg into the muscle as needed for anaphylaxis.    [provider]  famotidine (PEPCID) 40 MG tablet Take 40 mg by mouth See admin instructions. Take one 40 mg tablet twice a day at  6AM and 6PM.    [provider]  ipratropium-albuterol (DUONEB) 0.5-2.5 (3) MG/3ML SOLN Inhale 3 mLs into the lungs See admin instructions. Qid and prn    [provider]  ketotifen (ZADITOR) 0.025 % ophthalmic solution Place 1 drop into both eyes daily as needed (Mast cell, Allergies, MCS/EI).    [provider]  levocetirizine (XYZAL) 5 MG tablet Take 10 mg by mouth in the morning and at bedtime. 6am,6pm 06/01/20   [provider]  LORazepam (ATIVAN) 0.5 MG tablet Take 1 tablet (0.5 mg total) by mouth 2 (two) times daily as needed for anxiety. 10/20/20   Zena Amos, MD  ondansetron (ZOFRAN) 8 MG tablet Take 8 mg by mouth every 6 (six) hours as needed for nausea or vomiting.    [provider]  ondansetron (ZOFRAN-ODT) 4 MG disintegrating tablet Take 4 mg by mouth every 8 (eight) hours as needed for nausea or vomiting.    [provider]  Potassium Citrate 99 MG CAPS Take 99 mg by mouth daily. 6 or 9pm    [provider]  predniSONE (DELTASONE) 20 MG tablet Take 20 mg by mouth daily. 08/23/20   [provider]  predniSONE (DELTASONE) 5 MG tablet Take 5 tablets (25 mg total) by mouth daily with breakfast. Patient taking differently: Take 25 mg by mouth daily with breakfast. 6am 05/22/20   Lewie Chamber, MD  pyridostigmine (MESTINON) 60 MG tablet Take 1.5 tablets (90 mg total) by mouth every 3 (three) hours. 12am,3am,6am,9am,12pm,3pm,6pm,9pm 07/11/20   Westley Chandler, MD  vitamin B-12 (CYANOCOBALAMIN) 1000 MCG tablet Take 1,000 mcg by mouth daily. 6 or 9pm    [provider]  Vitamin D, Ergocalciferol, (DRISDOL) 1.25 MG (50000 UNIT) CAPS capsule Take 50,000 Units by mouth 2 (two) times a week.    [provider]  Monte Fantasia INHUB 250-50 MCG/ACT AEPB Inhale 1 puff into the lungs 2 (two) times daily. 6am and 6pm 10/09/20   [provider]  zafirlukast (ACCOLATE) 20 MG tablet Take 1 tablet (20 mg total) by mouth 2  (two) times daily before a meal. 6am 6pm 07/11/20   Westley Chandler, MD    Allergies    Aspirin, Nsaids, Quinolones, Sulfa antibiotics, Telithromycin, Fludrocortisone, Ambien [zolpidem], Amitriptyline, Beef-potatoes-spinach [compleat], Botulinum toxins, Chlorhexidine, Ciprofloxacin, Codeine, Contrast media [iodinated diagnostic agents], Creon [pancrelipase (lip-prot-amyl)], Cymbalta [duloxetine hcl], Dextrans, Ergotamine, Fluoxetine, Gabapentin, Gallamine, Hydrocodone, Iodine, Lactose intolerance (gi), Levofloxacin, Lunesta [eszopiclone], Macrolides and ketolides, Maprotiline, Marplan [isocarboxazid], Midodrine, Morphine and related, Nitrous oxide, Oxycodone, Pethidine [meperidine], Phenergan [promethazine], Phenobarbital, Pyridium [phenazopyridine], Reglan [metoclopramide], Remeron [mirtazapine], Salicylates, Savella [milnacipran], Scopolamine, Sumatriptan, Tape, Topiramate, Tricyclic antidepressants, Tums [calcium carbonate], Vancomycin, and Verapamil  Review of Systems   Review of Systems  All other systems reviewed and are negative.   Physical Exam Updated Vital Signs BP (!) 162/56   Pulse 95   Temp 98.3 F (36.8 C)   Resp 18   SpO2 96%   Physical Exam Vitals and nursing note  reviewed.  Constitutional:      General: She is not in acute distress.    Appearance: Normal appearance. She is well-developed. She is not toxic-appearing.  HENT:     Head: Normocephalic and atraumatic.  Eyes:     General: Lids are normal.     Conjunctiva/sclera: Conjunctivae normal.     Pupils: Pupils are equal, round, and reactive to light.  Neck:     Thyroid: No thyroid mass.     Trachea: No tracheal deviation.  Cardiovascular:     Rate and Rhythm: Normal rate and regular rhythm.     Heart sounds: Normal heart sounds. No murmur heard. No gallop.   Pulmonary:     Effort: Pulmonary effort is normal. No respiratory distress.     Breath sounds: Normal breath sounds. No stridor. No decreased breath  sounds, wheezing, rhonchi or rales.  Abdominal:     General: Bowel sounds are normal. There is no distension.     Palpations: Abdomen is soft.     Tenderness: There is no abdominal tenderness. There is no rebound.    Musculoskeletal:        General: No tenderness. Normal range of motion.     Cervical back: Normal range of motion and neck supple.  Skin:    General: Skin is warm and dry.     Findings: No abrasion or rash.  Neurological:     Mental Status: She is alert and oriented to person, place, and time. Mental status is at baseline.     GCS: GCS eye subscore is 4. GCS verbal subscore is 5. GCS motor subscore is 6.     Cranial Nerves: Cranial nerves are intact.  Psychiatric:        Attention and Perception: Attention normal.        Speech: Speech normal.        Behavior: Behavior normal.     ED Results / Procedures / Treatments   Labs (all labs ordered are listed, but only abnormal results are displayed) Labs Reviewed - No data to display  EKG None  Radiology No results found.  Procedures Procedures   Medications Ordered in ED Medications  fluconazole (DIFLUCAN) tablet 150 mg (has no administration in time range)    ED Course  I have reviewed the triage vital signs and the nursing notes.  Pertinent labs & imaging results that were available during my care of the patient were reviewed by me and considered in my medical decision making (see chart for details).    MDM Rules/Calculators/A&P                          Nursing accessed patient's port here.  Given oral dose of fluconazole here and will prescribe nystatin cream.  Patient feels comfortable going home and she has establish new home health care Final Clinical Impression(s) / ED Diagnoses Final diagnoses:  None    Rx / DC Orders ED Discharge Orders    None       Lorre Nick, MD 11/11/20 1709

## 2020-11-11 NOTE — ED Notes (Signed)
Pt reports her friend is driving her home from the hospital

## 2020-11-11 NOTE — Progress Notes (Signed)
Pt completed TPE tx without issue. Pt K=2.5.                        Patient departed unit via wheelchair by this RN to transport van vitals stable.Marland Kitchen

## 2020-11-11 NOTE — ED Provider Notes (Signed)
Emergency Medicine Provider Triage Evaluation Note  Brandy [name] , a 30 y.o. female  was evaluated in triage.  Pt complains of social work issue. States that after being discharged from the hospital she switched to a new home health company. She was told to de-access her port so she did however the new company then said they could not take care of her so she has not been receiving her hydration, etc  Review of Systems  Positive: nv Negative: fever  Physical Exam  There were no vitals taken for this visit. Gen:   Awake, no distress   Resp:  Normal effort  MSK:   Moves extremities without difficulty  Other:    Medical Decision Making  Medically screening exam initiated at 1:16 PM.  Appropriate orders placed.  Brandy [name] was informed that the remainder of the evaluation will be completed by another provider, this initial triage assessment does not replace that evaluation, and the importance of remaining in the ED until their evaluation is complete.    Brandy [name] 11/11/20 1323    Brandy Lefevre, MD 11/12/20 1654

## 2020-11-11 NOTE — Discharge Planning (Signed)
RNCM met with pt outside regarding home health services.  Pt states she spoke with former [place] ([facility]) and they agreed to resume services with her.  RNCM confirmed with Emmit Pomfret of [facility] that they can resume services; however, [facility] is a [place] and does not offer therapy (PT/OT).

## 2020-11-12 LAB — VITAMIN B1
Vitamin B1 (Thiamine): 50.3 nmol/L — ABNORMAL LOW (ref 66.5–200.0)
Vitamin B1 (Thiamine): 44.5 nmol/L — ABNORMAL LOW (ref 66.5–200.0)

## 2020-11-13 ENCOUNTER — Observation Stay (HOSPITAL_COMMUNITY)
Admission: RE | Admit: 2020-11-13 | Discharge: 2020-11-13 | Disposition: A | Discharge status: Home / Self Care | Payer: 59 | Source: Other Acute Inpatient Hospital | Attending: Internal Medicine | Admitting: Internal Medicine

## 2020-11-13 DIAGNOSIS — Z87891 Personal history of nicotine dependence: Secondary | ICD-10-CM | POA: Insufficient documentation

## 2020-11-13 DIAGNOSIS — E876 Hypokalemia: Secondary | ICD-10-CM | POA: Diagnosis present

## 2020-11-13 DIAGNOSIS — D649 Anemia, unspecified: Secondary | ICD-10-CM | POA: Diagnosis present

## 2020-11-13 DIAGNOSIS — E43 Unspecified severe protein-calorie malnutrition: Secondary | ICD-10-CM | POA: Diagnosis present

## 2020-11-13 DIAGNOSIS — Z79899 Other long term (current) drug therapy: Secondary | ICD-10-CM | POA: Insufficient documentation

## 2020-11-13 DIAGNOSIS — F4310 Post-traumatic stress disorder, unspecified: Secondary | ICD-10-CM | POA: Diagnosis not present

## 2020-11-13 DIAGNOSIS — G7000 Myasthenia gravis without (acute) exacerbation: Secondary | ICD-10-CM | POA: Diagnosis present

## 2020-11-13 LAB — BASIC METABOLIC PANEL
Anion gap: 10 (ref 5–15)
BUN: <5 mg/dL — ABNORMAL LOW (ref 6–20)
CO2: 27 mmol/L (ref 22–32)
Calcium: 7.4 mg/dL — ABNORMAL LOW (ref 8.9–10.3)
Chloride: 105 mmol/L (ref 98–111)
Creatinine, Ser: 0.4 mg/dL — ABNORMAL LOW (ref 0.44–1.00)
GFR, Estimated: >60 mL/min (ref >60)
Glucose, Bld: 123 mg/dL — ABNORMAL HIGH (ref 70–99)
Potassium: 2.3 mmol/L — CL (ref 3.5–5.1)
Sodium: 142 mmol/L (ref 135–145)

## 2020-11-13 LAB — CBC
HCT: 22.3 % — ABNORMAL LOW (ref 36.0–46.0)
Hemoglobin: 7.3 g/dL — ABNORMAL LOW (ref 12.0–15.0)
MCH: 27.5 pg (ref 26.0–34.0)
MCHC: 32.7 g/dL (ref 30.0–36.0)
MCV: 84.2 fL (ref 80.0–100.0)
Platelets: 102 10*3/uL — ABNORMAL LOW (ref 150–400)
RBC: 2.65 MIL/uL — ABNORMAL LOW (ref 3.87–5.11)
RDW: 19.4 % — ABNORMAL HIGH (ref 11.5–15.5)
WBC: 7 10*3/uL (ref 4.0–10.5)
nRBC: 0 % (ref 0.0–0.2)

## 2020-11-13 MED ORDER — SODIUM CHLORIDE 0.9% FLUSH: 3.0000 mL | Freq: Two times a day (BID) | INTRAVENOUS | Status: DC

## 2020-11-13 MED ORDER — ACD FORMULA A 0.73-2.45-2.2 GM/100ML VI SOLN: 1000.0000 mL | Status: DC

## 2020-11-13 MED ORDER — IPRATROPIUM-ALBUTEROL 0.5-2.5 (3) MG/3ML IN SOLN: 3.0000 mL | Freq: Four times a day (QID) | RESPIRATORY_TRACT | Status: DC | PRN

## 2020-11-13 MED ORDER — CALCIUM GLUCONATE-NACL 2-0.675 GM/100ML-% IV SOLN
INTRAVENOUS | Status: AC
  Administered 2020-11-13: 2000 mg via INTRAVENOUS
  Filled 2020-11-13: qty 100

## 2020-11-13 MED ORDER — CALCIUM GLUCONATE-NACL 2-0.675 GM/100ML-% IV SOLN: 2.0000 g | Freq: Once | INTRAVENOUS | Status: DC

## 2020-11-13 MED ORDER — ONDANSETRON 4 MG PO TBDP
4.0000 mg | ORAL_TABLET | Freq: Three times a day (TID) | ORAL | Status: DC | PRN
  Filled 2020-11-13: qty 1

## 2020-11-13 MED ORDER — ACETAMINOPHEN 325 MG PO TABS: 650.0000 mg | ORAL_TABLET | ORAL | Status: DC | PRN

## 2020-11-13 MED ORDER — POTASSIUM CHLORIDE 10 MEQ/100ML IV SOLN
10.0000 meq | INTRAVENOUS | Status: DC
  Administered 2020-11-13 (×2): 10 meq via INTRAVENOUS
  Filled 2020-11-13 (×2): qty 100

## 2020-11-13 MED ORDER — ONDANSETRON HCL 4 MG PO TABS: 8.0000 mg | ORAL_TABLET | Freq: Four times a day (QID) | ORAL | Status: DC | PRN

## 2020-11-13 MED ORDER — VITAMIN B-12 1000 MCG PO TABS: 1000.0000 ug | ORAL_TABLET | Freq: Every day | ORAL | Status: DC

## 2020-11-13 MED ORDER — LEVOCETIRIZINE DIHYDROCHLORIDE 5 MG PO TABS: 10.0000 mg | ORAL_TABLET | ORAL | Status: DC

## 2020-11-13 MED ORDER — PREDNISONE 5 MG PO TABS: 25.0000 mg | ORAL_TABLET | Freq: Every day | ORAL | Status: DC

## 2020-11-13 MED ORDER — POTASSIUM CHLORIDE 20 MEQ PO PACK: 20.0000 meq | PACK | Freq: Two times a day (BID) | ORAL | Status: DC

## 2020-11-13 MED ORDER — DIPHENHYDRAMINE-ZINC ACETATE 1-0.1 % EX CREA: 1.0000 "application " | TOPICAL_CREAM | Freq: Every day | CUTANEOUS | Status: DC | PRN

## 2020-11-13 MED ORDER — CALCIUM GLUCONATE 10 % IV SOLN: 2.0000 g | Freq: Once | INTRAVENOUS | Status: DC

## 2020-11-13 MED ORDER — IPRATROPIUM-ALBUTEROL 0.5-2.5 (3) MG/3ML IN SOLN: 3.0000 mL | Freq: Four times a day (QID) | RESPIRATORY_TRACT | Status: DC

## 2020-11-13 MED ORDER — SODIUM CHLORIDE 0.9 % IV SOLN: 250.0000 mL | INTRAVENOUS | Status: DC | PRN

## 2020-11-13 MED ORDER — ALBUTEROL SULFATE HFA 108 (90 BASE) MCG/ACT IN AERS: 1.0000 | INHALATION_SPRAY | Freq: Four times a day (QID) | RESPIRATORY_TRACT | Status: DC | PRN

## 2020-11-13 MED ORDER — POTASSIUM CHLORIDE 10 MEQ/100ML IV SOLN: 10.0000 meq | INTRAVENOUS | Status: DC

## 2020-11-13 MED ORDER — DIPHENHYDRAMINE HCL 50 MG/ML IJ SOLN
INTRAMUSCULAR | Status: AC
  Administered 2020-11-13: 25 mg via INTRAVENOUS
  Filled 2020-11-13: qty 1

## 2020-11-13 MED ORDER — CROMOLYN SODIUM 20 MG/2ML IN NEBU: 20.0000 mg | INHALATION_SOLUTION | Freq: Three times a day (TID) | RESPIRATORY_TRACT | Status: DC

## 2020-11-13 MED ORDER — CROMOLYN SODIUM 100 MG/5ML PO CONC: 100.0000 mg | ORAL | Status: DC

## 2020-11-13 MED ORDER — ENOXAPARIN SODIUM 40 MG/0.4ML IJ SOSY: 40.0000 mg | PREFILLED_SYRINGE | INTRAMUSCULAR | Status: DC

## 2020-11-13 MED ORDER — ALBUMIN HUMAN 25 % IV SOLN
INTRAVENOUS | Status: DC
  Filled 2020-11-13 (×3): qty 200

## 2020-11-13 MED ORDER — PYRIDOSTIGMINE BROMIDE 60 MG PO TABS
90.0000 mg | ORAL_TABLET | ORAL | Status: DC
  Filled 2020-11-13 (×5): qty 1.5

## 2020-11-13 MED ORDER — FAMOTIDINE 20 MG PO TABS: 40.0000 mg | ORAL_TABLET | Freq: Two times a day (BID) | ORAL | Status: DC

## 2020-11-13 MED ORDER — PREDNISONE 20 MG PO TABS: 20.0000 mg | ORAL_TABLET | Freq: Every day | ORAL | Status: DC

## 2020-11-13 MED ORDER — MOMETASONE FURO-FORMOTEROL FUM 200-5 MCG/ACT IN AERO: 2.0000 | INHALATION_SPRAY | Freq: Two times a day (BID) | RESPIRATORY_TRACT | Status: DC

## 2020-11-13 MED ORDER — IVABRADINE HCL 5 MG PO TABS
5.0000 mg | ORAL_TABLET | Freq: Two times a day (BID) | ORAL | Status: DC
  Filled 2020-11-13 (×2): qty 1

## 2020-11-13 MED ORDER — ANTICOAGULANT SODIUM CITRATE 4% (200MG/5ML) IV SOLN
5.0000 mL | Freq: Once | Status: DC
  Administered 2020-11-13: 5 mL
  Filled 2020-11-13: qty 5

## 2020-11-13 MED ORDER — LORATADINE 10 MG PO TABS: 10.0000 mg | ORAL_TABLET | Freq: Two times a day (BID) | ORAL | Status: DC

## 2020-11-13 MED ORDER — MOMETASONE FURO-FORMOTEROL FUM 200-5 MCG/ACT IN AERO
2.0000 | INHALATION_SPRAY | Freq: Two times a day (BID) | RESPIRATORY_TRACT | Status: DC
  Filled 2020-11-13: qty 8.8

## 2020-11-13 MED ORDER — CROMOLYN SODIUM 20 MG/2ML IN NEBU
20.0000 mg | INHALATION_SOLUTION | Freq: Three times a day (TID) | RESPIRATORY_TRACT | Status: DC
  Filled 2020-11-13 (×3): qty 2

## 2020-11-13 MED ORDER — DIPHENHYDRAMINE-ZINC ACETATE 2-0.1 % EX CREA: TOPICAL_CREAM | Freq: Every day | CUTANEOUS | Status: DC | PRN

## 2020-11-13 MED ORDER — SODIUM CHLORIDE 0.9% FLUSH: 3.0000 mL | INTRAVENOUS | Status: DC | PRN

## 2020-11-13 MED ORDER — LORAZEPAM 0.5 MG PO TABS: 0.5000 mg | ORAL_TABLET | Freq: Two times a day (BID) | ORAL | Status: DC | PRN

## 2020-11-13 MED ORDER — NYSTATIN 100000 UNIT/GM EX CREA
TOPICAL_CREAM | Freq: Two times a day (BID) | CUTANEOUS | Status: DC
  Filled 2020-11-13: qty 15

## 2020-11-13 MED ORDER — ACETAMINOPHEN 325 MG PO TABS: 650.0000 mg | ORAL_TABLET | Freq: Four times a day (QID) | ORAL | Status: DC | PRN

## 2020-11-13 MED ORDER — KETOTIFEN FUMARATE 0.025 % OP SOLN: 1.0000 [drp] | Freq: Every day | OPHTHALMIC | Status: DC | PRN

## 2020-11-13 MED ORDER — DIPHENHYDRAMINE HCL 50 MG/ML IJ SOLN: 25.0000 mg | Freq: Once | INTRAMUSCULAR | Status: AC

## 2020-11-13 MED ORDER — ACETAMINOPHEN 650 MG RE SUPP: 650.0000 mg | Freq: Four times a day (QID) | RECTAL | Status: DC | PRN

## 2020-11-13 MED ORDER — BACLOFEN 10 MG PO TABS: 10.0000 mg | ORAL_TABLET | Freq: Four times a day (QID) | ORAL | Status: DC

## 2020-11-13 MED ORDER — DIPHENHYDRAMINE HCL 25 MG PO CAPS: 25.0000 mg | ORAL_CAPSULE | Freq: Once | ORAL | Status: DC

## 2020-11-13 MED ORDER — ACD FORMULA A 0.73-2.45-2.2 GM/100ML VI SOLN
Status: AC
  Administered 2020-11-13: 1000 mL
  Filled 2020-11-13: qty 500

## 2020-11-13 NOTE — Progress Notes (Signed)
11/13/2020 12:55 PM  Patient informed by Hemodialysis staff of possible admission due to resulted labs (check flow sheet). Patient informed staff that she refuses to stay. Notified MD. Debby Bud MD at bedside.   Nardos Putnam MSN, RN-BC, CNML [facility] Renal Phone: [phone]

## 2020-11-13 NOTE — H&P (Addendum)
History and Physical    Brandy [name] SLH:[id number] DOB: August 02, 1990 DOA: 11/13/2020  PCP: Lamont Dowdy, MD (Confirm with patient/family/NH records and if not entered, this has to be entered at [facility] point of entry) Patient coming from: Home/HD unit  I have personally briefly reviewed patient's old medical records in [facility]  Chief Complaint: hypokalemia  HPI: Brandy [name] is a 30 y.o. female with medical history significant of myasthenia gravis, Ehlers-Danlos syndrome, mast cell activation syndrome, POTS, protein calorie malnutrition status post PEG tube placement and dysautonomia. Patient presents to HD unit for plasma exchange today. Of note she has had recent admission for pneumonia which cleared. Post plasma exchange lab revealed K 2.3. Dr. Amada Jupiter recommended observation admission for the purpose of potassium replenishment. TR[facility] called to admit the patient as observation status.     ED Course: not seen in ED  Review of Systems: As per HPI otherwise 10 point review of systems negative.    Past Medical History:  Diagnosis Date  . Dysautonomia (HCC) 06/28/2020  . Ehlers-Danlos syndrome 06/28/2020  . 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 Myasthenia gravis (HCC)   . POTS (postural orthostatic tachycardia syndrome) 06/28/2020  . Severe protein-calorie malnutrition (HCC) 05/20/2020    Past Surgical History:  Procedure Laterality Date  . IR FLUORO GUIDE CV LINE LEFT  11/03/2020  . IR GJ TUBE CHANGE  07/11/2020  . IR REMOVAL TUN CV CATH W/O FL  06/03/2020  . IR US GUIDE VASC ACCESS LEFT  11/03/2020    Soc Hx - patient has a 35 y/o son. She has transportation issues and has a hard time getting to see her PCP at [facility] primary care. She needs HH and in-home aid services. Currently inbewteen HH agencies.    reports that she has quit smoking. She has never used smokeless tobacco. No history on file for alcohol use and drug use.  Allergies  Allergen  Reactions  . Aspirin   . Nsaids   . Quinolones   . Sulfa Antibiotics   . Telithromycin   . Fludrocortisone   . Ambien [Zolpidem]   . Amitriptyline   . Beef-Potatoes-Spinach [Compleat]   . Botulinum Toxins   . Chlorhexidine Hives  . Ciprofloxacin   . Codeine   . Contrast Media [Iodinated Diagnostic Agents]   . Creon [Pancrelipase (Lip-Prot-Amyl)]   . Cymbalta [Duloxetine Hcl]   . Dextrans   . Ergotamine   . Fluoxetine     All SSRIs All SSRIs   . Gabapentin   . Gallamine   . Hydrocodone   . Iodine   . Lactose Intolerance (Gi)   . Levofloxacin   . Lunesta [Eszopiclone]   . Macrolides And Ketolides   . Maprotiline   . Marplan [Isocarboxazid]     Any MAOI contraindicated  . Midodrine   . Morphine And Related   . Nitrous Oxide   . Oxycodone   . Pethidine [Meperidine]   . Phenergan [Promethazine]   . Phenobarbital   . Pyridium [Phenazopyridine]   . Reglan [Metoclopramide]   . Remeron [Mirtazapine]     Noradrenergic antagonist  . Salicylates   . Savella [Milnacipran]   . Scopolamine   . Sumatriptan   . Tape   . Topiramate   . Tricyclic Antidepressants   . Tums [Calcium Carbonate]     Patient reports allergic.  RN to find out adverse reaction and update allergy field.   . Vancomycin   . Verapamil  Family History  Adopted: Yes     Prior to Admission medications   Medication Sig Start Date End Date Taking? Authorizing Provider  albuterol (VENTOLIN HFA) 108 (90 Base) MCG/ACT inhaler Inhale into the lungs every 6 (six) hours as needed for wheezing or shortness of breath.    [provider]  baclofen (LIORESAL) 10 MG tablet Take 1 tablet (10 mg total) by mouth See admin instructions. Take one 10 mg tablet four times a day at 6AM, 12PM, 6PM, and 12AM 07/11/20   Westley Chandler, MD  CORLANOR 5 MG TABS tablet Take 1 tablet (5 mg total) by mouth 2 (two) times daily. 6am,6pm 07/11/20   Westley Chandler, MD  cromolyn (GASTROCROM) 100 MG/5ML solution Take 100  mg by mouth See admin instructions. 2 ampule in water taken 4 times a day before meals/bed and PRN    [provider]  cromolyn (INTAL) 20 MG/2ML nebulizer solution Take 20 mg by nebulization See admin instructions. 1 vial taken 3 times a day and PRN    [provider]  diphenhydrAMINE-zinc acetate (BENADRYL) cream Apply 1-2 application topically daily as needed (Rash from Mast Cell and Allergies).    [provider]  EPINEPHrine 0.3 mg/0.3 mL IJ SOAJ injection Inject 0.3 mg into the muscle as needed for anaphylaxis.    [provider]  famotidine (PEPCID) 40 MG tablet Take 40 mg by mouth See admin instructions. Take one 40 mg tablet twice a day at 6AM and 6PM.    [provider]  ipratropium-albuterol (DUONEB) 0.5-2.5 (3) MG/3ML SOLN Inhale 3 mLs into the lungs See admin instructions. Qid and prn    [provider]  ketotifen (ZADITOR) 0.025 % ophthalmic solution Place 1 drop into both eyes daily as needed (Mast cell, Allergies, MCS/EI).    [provider]  levocetirizine (XYZAL) 5 MG tablet Take 10 mg by mouth in the morning and at bedtime. 6am,6pm 06/01/20   [provider]  LORazepam (ATIVAN) 0.5 MG tablet Take 1 tablet (0.5 mg total) by mouth 2 (two) times daily as needed for anxiety. 10/20/20   Zena Amos, MD  nystatin cream (MYCOSTATIN) Apply to affected area 2 times daily 11/11/20   Lorre Nick, MD  ondansetron (ZOFRAN) 8 MG tablet Take 8 mg by mouth every 6 (six) hours as needed for nausea or vomiting.    [provider]  ondansetron (ZOFRAN-ODT) 4 MG disintegrating tablet Take 4 mg by mouth every 8 (eight) hours as needed for nausea or vomiting.    [provider]  Potassium Citrate 99 MG CAPS Take 99 mg by mouth daily. 6 or 9pm    [provider]  predniSONE (DELTASONE) 20 MG tablet Take 20 mg by mouth daily. 08/23/20   [provider]  predniSONE (DELTASONE) 5 MG tablet Take 5  tablets (25 mg total) by mouth daily with breakfast. Patient taking differently: Take 25 mg by mouth daily with breakfast. 6am 05/22/20   Lewie Chamber, MD  pyridostigmine (MESTINON) 60 MG tablet Take 1.5 tablets (90 mg total) by mouth every 3 (three) hours. 12am,3am,6am,9am,12pm,3pm,6pm,9pm 07/11/20   Westley Chandler, MD  vitamin B-12 (CYANOCOBALAMIN) 1000 MCG tablet Take 1,000 mcg by mouth daily. 6 or 9pm    [provider]  Vitamin D, Ergocalciferol, (DRISDOL) 1.25 MG (50000 UNIT) CAPS capsule Take 50,000 Units by mouth 2 (two) times a week.    [provider]  Monte Fantasia INHUB 250-50 MCG/ACT AEPB Inhale 1 puff into the  lungs 2 (two) times daily. 6am and 6pm 10/09/20   [provider]  zafirlukast (ACCOLATE) 20 MG tablet Take 1 tablet (20 mg total) by mouth 2 (two) times daily before a meal. 6am 6pm 07/11/20   Westley Chandler, MD    Physical Exam: Vitals:   11/13/20 0945 11/13/20 1125 11/13/20 1140 11/13/20 1152  BP: 129/80 125/82 128/80 133/76  Pulse: 67 72 70 70  Temp: 98 F (36.7 C)     TempSrc: Axillary        Vitals:   11/13/20 0945 11/13/20 1125 11/13/20 1140 11/13/20 1152  BP: 129/80 125/82 128/80 133/76  Pulse: 67 72 70 70  Temp: 98 F (36.7 C)     TempSrc: Axillary      General:  Very slender woman in no distress. Wears a full face-mask respirator due to pulmonary issues and allergies.  Eyes: PERRL, lids and conjunctivae normal ENMT: refused exam Neck: normal, supple, no masses, no thyromegaly Respiratory: clear to auscultation bilaterally, no wheezing, no crackles. Normal respiratory effort. No accessory muscle use.  Cardiovascular: Regular rate tachycardia, no murmurs / rubs / gallops. No extremity edema. Abdomen: no tenderness, no masses palpated.  Bowel sounds positive. J tube site looks OK Musculoskeletal: no clubbing / cyanosis. Decreased  muscle tone.  Skin: refused exam Neurologic: CN 2-12 grossly intact.  Psychiatric: Normal judgment and  insight. Alert and oriented x 3. Normal mood.     Labs on Admission: I have personally reviewed following labs and imaging studies  CBC: Recent Labs  Lab 11/09/20 0955 11/11/20 1104 11/13/20 0957  WBC 10.3 9.7 7.0  HGB 7.4* 8.0* 7.3*  HCT 24.4* 25.5* 22.3*  MCV 87.8 86.7 84.2  PLT 171 131* 102*   Basic Metabolic Panel: Recent Labs  Lab 11/09/20 0955 11/11/20 1104 11/13/20 0957  NA 141 144 142  K 2.9* 2.5* 2.3*  CL 110 107 105  CO2 24 28 27   GLUCOSE 122* 87 123*  BUN <5* <5* <5*  CREATININE 0.50 0.60 0.40*  CALCIUM 7.7* 7.8* 7.4*  PHOS  --  2.9  --    GFR: Estimated Creatinine Clearance: 72.6 mL/min (A) (by C-G formula based on SCr of 0.4 mg/dL (L)). Liver Function Tests: Recent Labs  Lab 11/11/20 1104  ALBUMIN 3.3*   No results for input(s): LIPASE, AMYLASE in the last 168 hours. No results for input(s): AMMONIA in the last 168 hours. Coagulation Profile: No results for input(s): INR, PROTIME in the last 168 hours. Cardiac Enzymes: No results for input(s): CKTOTAL, CKMB, CKMBINDEX, TROPONINI in the last 168 hours. BNP (last 3 results) No results for input(s): PROBNP in the last 8760 hours. HbA1C: No results for input(s): HGBA1C in the last 72 hours. CBG: No results for input(s): GLUCAP in the last 168 hours. Lipid Profile: No results for input(s): CHOL, HDL, LDLCALC, TRIG, CHOLHDL, LDLDIRECT in the last 72 hours. Thyroid Function Tests: No results for input(s): TSH, T4TOTAL, FREET4, T3FREE, THYROIDAB in the last 72 hours. Anemia Panel: No results for input(s): VITAMINB12, FOLATE, FERRITIN, TIBC, IRON, RETICCTPCT in the last 72 hours. Urine analysis:    Component Value Date/Time   COLORURINE AMBER (A) 10/27/2020 0420   APPEARANCEUR HAZY (A) 10/27/2020 0420   LABSPEC 1.019 10/27/2020 0420   PHURINE 6.0 10/27/2020 0420   GLUCOSEU NEGATIVE 10/27/2020 0420   HGBUR NEGATIVE 10/27/2020 0420   BILIRUBINUR NEGATIVE 10/27/2020 0420   KETONESUR 5 (A)  10/27/2020 0420   PROTEINUR 30 (A) 10/27/2020 0420   NITRITE  NEGATIVE 10/27/2020 0420   LEUKOCYTESUR NEGATIVE 10/27/2020 0420    Radiological Exams on Admission: No results found.  EKG: Independently reviewed. No EKG  Assessment/Plan Active Problems:   Hypokalemia   Severe protein-calorie malnutrition (HCC)   Myasthenia gravis (HCC)   PTSD (post-traumatic stress disorder)   Absolute anemia   Patient refused over night admission but is willing for multiple hour admission for IV K replacement.   1. Hypokalemia - chronic hypokalemia but worse over last several days with K 2.3 today.  Plan IV K replacement 10 meq/hr x 3 with repeat lab to follow  Continue home meds  2. MG - patient had last plasma exchange today. She will be called by IR to schedule appt for removal of HD catheter  3. Social issues - very complex situation. Placed call to [facility] Delane [facility]Gill at [facility] - [facility] and requested that this be expedited.   Addendum 11/14/20: At patient's request [facility] infusion therapy contacted: gave order for her daily IVF I L have 20 meq K added for the next 4 days with K level to be drawn on the 5th day. Results to patient's PCP Dr. Delane GingerGill.  Contacted Dr. Delane GingerGill and personally gave her an update on the patient, her tx plan, her refusal to accept overnight admission. Dr. Delane GingerGill has yet to see patient but is very familiar with her history. Her office will [facility] and will follow up on lab work.   DVT prophylaxis: lovenox Code Status: full code  Family Communication: spoke with Brandy [name], listed as emergency contact. I explained situation of patient refusing overnight admission and that we will do what we can in the time she allows us.   Disposition Plan: home, HH to be set up by her PCP Dr. Delane GingerGill, [facility]  Consults called: none (with names) Admission status: obs    Illene RegulusMichael  Teran Daughenbaugh MD [facility] Pager [phone]  If 7PM-7AM, please contact night-coverage [email or web address] Password [email or web address]  11/13/2020, 12:50 PM

## 2020-11-13 NOTE — Progress Notes (Signed)
Pt arrived on unit via electric w/c from Hemodialysis. Pt to receive 3 runs of potassium d/t Potassium level of 2.3. Pt states the she can only stay until 1730 and then she will be leaving. Voiced that she wanted the doctor to call [facility] pharmacy and have them add the potassium to the liter of fluid that she receives daily. She stated that she would allow the potassium, but regardless, she will be leaving at 1730. Lucas Mallow MD notified of patient's request.

## 2020-11-13 NOTE — Progress Notes (Signed)
Therapeutic Plasma Exchange complete. Patient tolerated well. Based on Potassium lab being low it was ordered for her to receive Potassium prior to discharge. This RN transported patient via her electric wheelchair to [phone] alert oriented vitals stable w/o complaint.

## 2020-11-13 NOTE — Progress Notes (Signed)
Pt. Has left out of room and states that her ride is her and she is getting ready to leave. Pt received approximately 1.5 bags of potassium. Informed patient that if she leaves without the doctor giving discharge orders that she will be leaving AMA. She was informed that this may take a few minutes for the doctor to respond with said orders. Pt. Said she could only wait a few minutes. Informed patient that if she was not willing to wait on the doctor to give discharge orders that she would be leaving AMA and needed to sign AMA papers. Patient refused to do so and said she was leaving. Unit director and charge nurse witnessed and spoke with patient informing risk of her leaving AMA. Patient proceeded to leave. Lucas Mallow MD paged and notified.

## 2020-11-13 NOTE — Progress Notes (Signed)
I was paged earlier about patient receiving plasmapheresis with low potassium. I discussed with the hospitalist team(appreciate their assistance) and the plan was to admit for K repletion. The patient indicated she was unwilling to stay despite risks of leaving being described. She eventually left AMA. IR will call patient to schedule removal of tunneled HD catheter.   Ritta Slot, MD [facility] [phone]  If 7pm- 7am, please page neurology on call as listed in [facility].

## 2020-11-17 ENCOUNTER — Telehealth (HOSPITAL_COMMUNITY): Payer: Self-pay

## 2020-11-17 ENCOUNTER — Other Ambulatory Visit (HOSPITAL_COMMUNITY): Payer: Self-pay | Admitting: Neurology

## 2020-11-17 DIAGNOSIS — D8940 Mast cell activation, unspecified: Secondary | ICD-10-CM

## 2020-11-17 NOTE — Telephone Encounter (Signed)
Called to schedule hd cath removal, no answer, left vm. AW

## 2020-11-18 LAB — MISC LABCORP TEST (SEND OUT): Labcorp test code: 9985

## 2020-11-19 ENCOUNTER — Ambulatory Visit (HOSPITAL_COMMUNITY): Admission: RE | Admit: 2020-11-19 | Payer: 59 | Source: Ambulatory Visit

## 2020-11-19 LAB — VITAMIN A: Vitamin A (Retinoic Acid): <2.5 ug/dL — ABNORMAL LOW (ref 18.9–57.3)

## 2020-11-20 ENCOUNTER — Other Ambulatory Visit: Payer: Self-pay

## 2020-11-20 ENCOUNTER — Encounter (HOSPITAL_COMMUNITY): Payer: Self-pay | Admitting: Pharmacy Technician

## 2020-11-20 ENCOUNTER — Emergency Department (HOSPITAL_COMMUNITY): Payer: 59

## 2020-11-20 ENCOUNTER — Emergency Department (HOSPITAL_COMMUNITY)
Admission: EM | Admit: 2020-11-20 | Discharge: 2020-11-21 | Disposition: A | Discharge status: Home / Self Care | Payer: 59 | Attending: Emergency Medicine | Admitting: Emergency Medicine

## 2020-11-20 DIAGNOSIS — Z87891 Personal history of nicotine dependence: Secondary | ICD-10-CM | POA: Insufficient documentation

## 2020-11-20 DIAGNOSIS — E876 Hypokalemia: Secondary | ICD-10-CM | POA: Insufficient documentation

## 2020-11-20 LAB — BASIC METABOLIC PANEL
Anion gap: 6 (ref 5–15)
BUN: <5 mg/dL — ABNORMAL LOW (ref 6–20)
CO2: 26 mmol/L (ref 22–32)
Calcium: 8.3 mg/dL — ABNORMAL LOW (ref 8.9–10.3)
Chloride: 105 mmol/L (ref 98–111)
Creatinine, Ser: 0.42 mg/dL — ABNORMAL LOW (ref 0.44–1.00)
GFR, Estimated: >60 mL/min (ref >60)
Glucose, Bld: 84 mg/dL (ref 70–99)
Potassium: 3.5 mmol/L (ref 3.5–5.1)
Sodium: 137 mmol/L (ref 135–145)

## 2020-11-20 LAB — CBC WITH DIFFERENTIAL/PLATELET
Abs Immature Granulocytes: 0.04 10*3/uL (ref 0.00–0.07)
Basophils Absolute: 0 10*3/uL (ref 0.0–0.1)
Basophils Relative: 0 %
Eosinophils Absolute: 0.1 10*3/uL (ref 0.0–0.5)
Eosinophils Relative: 1 %
HCT: 23.9 % — ABNORMAL LOW (ref 36.0–46.0)
Hemoglobin: 7.7 g/dL — ABNORMAL LOW (ref 12.0–15.0)
Immature Granulocytes: 1 %
Lymphocytes Relative: 15 %
Lymphs Abs: 1.3 10*3/uL (ref 0.7–4.0)
MCH: 28.1 pg (ref 26.0–34.0)
MCHC: 32.2 g/dL (ref 30.0–36.0)
MCV: 87.2 fL (ref 80.0–100.0)
Monocytes Absolute: 0.9 10*3/uL (ref 0.1–1.0)
Monocytes Relative: 11 %
Neutro Abs: 6.4 10*3/uL (ref 1.7–7.7)
Neutrophils Relative %: 72 %
Platelets: 363 10*3/uL (ref 150–400)
RBC: 2.74 MIL/uL — ABNORMAL LOW (ref 3.87–5.11)
RDW: 21 % — ABNORMAL HIGH (ref 11.5–15.5)
WBC: 8.8 10*3/uL (ref 4.0–10.5)
nRBC: 0 % (ref 0.0–0.2)

## 2020-11-20 LAB — MAGNESIUM: Magnesium: 1.6 mg/dL — ABNORMAL LOW (ref 1.7–2.4)

## 2020-11-20 LAB — TROPONIN I (HIGH SENSITIVITY): Troponin I (High Sensitivity): 5 ng/L (ref <18)

## 2020-11-20 MED ORDER — SODIUM CHLORIDE 0.9 % IV BOLUS
500.0000 mL | Freq: Once | INTRAVENOUS | Status: AC
  Administered 2020-11-21: 500 mL via INTRAVENOUS

## 2020-11-20 MED ORDER — POTASSIUM CHLORIDE 10 MEQ/100ML IV SOLN
10.0000 meq | Freq: Once | INTRAVENOUS | Status: AC
  Administered 2020-11-21: 10 meq via INTRAVENOUS
  Filled 2020-11-20: qty 100

## 2020-11-20 NOTE — ED Triage Notes (Signed)
Pt here with reports of low potassium. Hx myasthenia gravis. Pt also states she needs to get her central line removed.

## 2020-11-20 NOTE — ED Provider Notes (Signed)
[facility] EMERGENCY DEPARTMENT Provider Note   CSN: [id number] Arrival date & time: 11/20/20  1700     History Chief Complaint  Patient presents with  . Abnormal Lab    Brandy [name] is a 30 y.o. female.  Patient presents to the emergency department with concerns over low potassium.  Patient reports that she had outpatient blood work and was told that her potassium was low.  She has a history of recurrent hypokalemia.        Past Medical History:  Diagnosis Date  . Dysautonomia (HCC) 06/28/2020  . Ehlers-Danlos syndrome 06/28/2020  . 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 Myasthenia gravis (HCC)   . POTS (postural orthostatic tachycardia syndrome) 06/28/2020  . Severe protein-calorie malnutrition (HCC) 05/20/2020    Patient Active Problem List   Diagnosis Date Noted  . Hypokalemia 11/13/2020  . Pressure injury of skin 10/31/2020  . SIRS (systemic inflammatory response syndrome) (HCC) 10/27/2020  . Pneumonia of both lower lobes due to infectious organism 10/27/2020  . Pain around PEG tube site 10/27/2020  . Hyponatremia 10/27/2020  . History of poor personal hygiene 10/27/2020  . Fever 10/27/2020  . Feeding tube dysfunction 07/10/2020  . 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 Ehlers-Danlos syndrome 06/28/2020  . Uses feeding tube 06/28/2020  . Restricted diet 06/28/2020  . 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 Chronic idiopathic urticaria 06/28/2020  . POTS (postural orthostatic tachycardia syndrome) 06/28/2020  . Dysautonomia (HCC) 06/28/2020  . PTSD (post-traumatic stress disorder) 06/28/2020  . GAD (generalized anxiety disorder) 06/28/2020  . History of major depression 06/28/2020  . Absolute anemia 06/28/2020  . On home oxygen therapy 06/28/2020  . Fibromyalgia 06/28/2020  . Myasthenia gravis (HCC) 05/23/2020  . Severe protein-calorie malnutrition (HCC)  05/20/2020  . Myasthenia exacerbation (HCC) 05/16/2020    Past Surgical History:  Procedure Laterality Date  . IR FLUORO GUIDE CV LINE LEFT  11/03/2020  . IR GJ TUBE CHANGE  07/11/2020  . IR REMOVAL TUN CV CATH W/O FL  06/03/2020  . IR US GUIDE VASC ACCESS LEFT  11/03/2020     OB History   No obstetric history on file.     Family History  Adopted: Yes    Social History   Tobacco Use  . Smoking status: Former Games developer  . Smokeless tobacco: Never Used    Home Medications Prior to Admission medications   Medication Sig Start Date End Date Taking? Authorizing Provider  albuterol (VENTOLIN HFA) 108 (90 Base) MCG/ACT inhaler Inhale into the lungs every 6 (six) hours as needed for wheezing or shortness of breath.    [provider]  baclofen (LIORESAL) 10 MG tablet Take 1 tablet (10 mg total) by mouth See admin instructions. Take one 10 mg tablet four times a day at 6AM, 12PM, 6PM, and 12AM 07/11/20   Westley Chandler, MD  CORLANOR 5 MG TABS tablet Take 1 tablet (5 mg total) by mouth 2 (two) times daily. 6am,6pm 07/11/20   Westley Chandler, MD  cromolyn (GASTROCROM) 100 MG/5ML solution Take 100 mg by mouth See admin instructions. Take 5 mls (100 mg) by mouth 4 times daily - before meals and at bedtime and as needed for mucous secretions    [provider]  cromolyn (INTAL) 20 MG/2ML nebulizer solution Take 20 mg by nebulization See admin instructions. Inhale one vial (20 mg) via nebulization  3 times a day and as  needed for mucous secretions    [provider]  cromolyn (NASALCROM) 5.2 MG/ACT nasal spray Place 1 spray into both nostrils See admin instructions. Instill one spray into each nostril 3 times daily and as needed for excess mucus    [provider]  diphenhydrAMINE-zinc acetate (BENADRYL) cream Apply 1-2 application topically daily as needed (Rash from Mast Cell and Allergies).    [provider]  EPINEPHrine 0.3 mg/0.3 mL IJ SOAJ injection  Inject 0.3 mg into the muscle once as needed for anaphylaxis.    [provider]  famotidine (PEPCID) 40 MG tablet Take 40 mg by mouth See admin instructions. Take one 40 mg tablet twice a day at 6AM and 6PM.    [provider]  ipratropium-albuterol (DUONEB) 0.5-2.5 (3) MG/3ML SOLN Inhale 3 mLs into the lungs See admin instructions. Inhale one vial (3 mls) via nebulizer four times daily and as needed for shortness of breath/wheezing    [provider]  ketotifen (ZADITOR) 0.025 % ophthalmic solution Place 1 drop into both eyes See admin instructions. Instil one drop into both eyes 3 times daily and as needed for Mast cell, Allergies, MCS/EI    [provider]  levocetirizine (XYZAL) 5 MG tablet Take 10 mg by mouth in the morning and at bedtime. 6am,6pm 06/01/20   [provider]  LORazepam (ATIVAN) 0.5 MG tablet Take 1 tablet (0.5 mg total) by mouth 2 (two) times daily as needed for anxiety. 10/20/20   Zena Amos, MD  nystatin cream (MYCOSTATIN) Apply to affected area 2 times daily Patient taking differently: Apply 1 application topically 2 (two) times daily. Apply to affected area 2 times daily 11/11/20   Lorre Nick, MD  ondansetron (ZOFRAN) 8 MG tablet Take 8 mg by mouth every 6 (six) hours as needed for nausea or vomiting.    [provider]  ondansetron (ZOFRAN-ODT) 4 MG disintegrating tablet Take 4 mg by mouth every 8 (eight) hours as needed for nausea or vomiting. Patient not taking: Reported on 11/13/2020    [provider]  ondansetron (ZOFRAN-ODT) 8 MG disintegrating tablet Take 8 mg by mouth every 4 (four) hours as needed for nausea or vomiting. 11/07/20   [provider]  Potassium Citrate 99 MG CAPS Take 99 mg by mouth daily. 6 or 9pm    [provider]  predniSONE (DELTASONE) 10 MG tablet Take 20 mg by mouth every morning. 11/07/20   [provider]  predniSONE (DELTASONE) 5 MG tablet Take 5 tablets  (25 mg total) by mouth daily with breakfast. Patient not taking: No sig reported 05/22/20   Lewie Chamber, MD  pyridostigmine (MESTINON) 60 MG tablet Take 1.5 tablets (90 mg total) by mouth every 3 (three) hours. 12am,3am,6am,9am,12pm,3pm,6pm,9pm 07/11/20   Westley Chandler, MD  vitamin B-12 (CYANOCOBALAMIN) 1000 MCG tablet Take 1,000 mcg by mouth daily. 6 or 9pm    [provider]  Vitamin D, Ergocalciferol, (DRISDOL) 1.25 MG (50000 UNIT) CAPS capsule Take 50,000 Units by mouth 2 (two) times a week. Patient not taking: Reported on 11/13/2020    [provider]  Monte Fantasia INHUB 250-50 MCG/ACT AEPB Inhale 1 puff into the lungs 2 (two) times daily. 6am and 6pm 10/09/20   [provider]  zafirlukast (ACCOLATE) 20 MG tablet Take 1 tablet (20 mg total) by mouth 2 (two) times daily before a meal. 6am 6pm 07/11/20   Westley Chandler, MD    Allergies    Aspirin, Nsaids, Quinolones, Sulfa  antibiotics, Telithromycin, Fludrocortisone, Ambien [zolpidem], Amitriptyline, Beef-potatoes-spinach [compleat], Benadryl [diphenhydramine], Botulinum toxins, Chlorhexidine, Ciprofloxacin, Codeine, Contrast media [iodinated diagnostic agents], Creon [pancrelipase (lip-prot-amyl)], Cymbalta [duloxetine hcl], Dextrans, Dilaudid [hydromorphone], Ergotamine, Fluoxetine, Gabapentin, Gallamine, Hydrocodone, Iodine, Lactose intolerance (gi), Levofloxacin, Lunesta [eszopiclone], Macrolides and ketolides, Maprotiline, Marplan [isocarboxazid], Midodrine, Morphine and related, Nitrous oxide, Oxycodone, Pethidine [meperidine], Phenergan [promethazine], Phenobarbital, Pyridium [phenazopyridine], Reglan [metoclopramide], Remeron [mirtazapine], Salicylates, Savella [milnacipran], Scopolamine, Sumatriptan, Tape, Topiramate, Tricyclic antidepressants, Tums [calcium carbonate], Vancomycin, and Verapamil  Review of Systems   Review of Systems  Cardiovascular: Positive for chest pain.  All other systems reviewed and are  negative.   Physical Exam Updated Vital Signs BP 139/90   Pulse (!) 58   Temp 97.8 F (36.6 C)   Resp 20   SpO2 100%   Physical Exam Vitals and nursing note reviewed.  Constitutional:      General: She is not in acute distress.    Appearance: She is underweight. She is ill-appearing. She is not toxic-appearing.  HENT:     Head: Normocephalic and atraumatic.     Right Ear: Hearing normal.     Left Ear: Hearing normal.     Nose: Nose normal.  Eyes:     Conjunctiva/sclera: Conjunctivae normal.     Pupils: Pupils are equal, round, and reactive to light.  Cardiovascular:     Rate and Rhythm: Regular rhythm.     Heart sounds: S1 normal and S2 normal. No murmur heard. No friction rub. No gallop.   Pulmonary:     Effort: Pulmonary effort is normal. No respiratory distress.     Breath sounds: Normal breath sounds.  Chest:     Chest wall: No tenderness.  Abdominal:     General: Bowel sounds are normal.     Palpations: Abdomen is soft.     Tenderness: There is no abdominal tenderness. There is no guarding or rebound. Negative signs include Murphy's sign and McBurney's sign.     Hernia: No hernia is present.  Musculoskeletal:        General: Normal range of motion.     Cervical back: Normal range of motion and neck supple.  Skin:    General: Skin is warm and dry.     Findings: No rash.  Neurological:     Mental Status: She is alert and oriented to person, place, and time.     GCS: GCS eye subscore is 4. GCS verbal subscore is 5. GCS motor subscore is 6.     Cranial Nerves: No cranial nerve deficit.     Sensory: No sensory deficit.     Coordination: Coordination normal.  Psychiatric:        Speech: Speech normal.        Behavior: Behavior normal.        Thought Content: Thought content normal.     ED Results / Procedures / Treatments   Labs (all labs ordered are listed, but only abnormal results are displayed) Labs Reviewed  BASIC METABOLIC PANEL - Abnormal; Notable  for the following components:      Result Value   BUN <5 (*)    Creatinine, Ser 0.42 (*)    Calcium 8.3 (*)    All other components within normal limits  CBC WITH DIFFERENTIAL/PLATELET - Abnormal; Notable for the following components:   RBC 2.74 (*)    Hemoglobin 7.7 (*)    HCT 23.9 (*)    RDW 21.0 (*)    All other components within normal limits  MAGNESIUM -  Abnormal; Notable for the following components:   Magnesium 1.6 (*)    All other components within normal limits  TROPONIN I (HIGH SENSITIVITY)  TROPONIN I (HIGH SENSITIVITY)    EKG EKG Interpretation  Date/Time:  Friday November 20 2020 22:10:28 EDT Ventricular Rate:  61 PR Interval:  115 QRS Duration: 81 QT Interval:  403 QTC Calculation: 406 R Axis:   69 Text Interpretation: Sinus rhythm Borderline short PR interval Confirmed by Gilda Crease [phone]) on 11/20/2020 10:55:52 PM   Radiology DG Chest 2 View  Result Date: 11/20/2020 CLINICAL DATA:  Chest pain EXAM: CHEST - 2 VIEW COMPARISON:  07/09/2020 FINDINGS: Right Port-A-Cath remains in place, unchanged. Interval placement of left dialysis catheter with tip in the right atrium. No pneumothorax. Bilateral pleural effusions, moderate on the left and small on the right. Bilateral lower lobe atelectasis or infiltrates. Heart is normal size. IMPRESSION: Bibasilar atelectasis or consolidation with bilateral effusions, left greater than right. Electronically Signed   By: Charlett Nose M.D.   On: 11/20/2020 19:12    Procedures Procedures   Medications Ordered in ED Medications  potassium chloride 10 mEq in 100 mL IVPB (has no administration in time range)  sodium chloride 0.9 % bolus 500 mL (has no administration in time range)    ED Course  I have reviewed the triage vital signs and the nursing notes.  Pertinent labs & imaging results that were available during my care of the patient were reviewed by me and considered in my medical decision making (see chart for  details).    MDM Rules/Calculators/A&P                          Patient reports that she was told her potassium was low on last blood draw which actually was a couple of days ago.  Repeat today was low normal at 3.5.  We will give her 10 mEq IV and she will be appropriate for discharge.  Although ill-appearing, she has at her baseline, no acute process present.  Final Clinical Impression(s) / ED Diagnoses Final diagnoses:  Hypokalemia    Rx / DC Orders ED Discharge Orders    None       Gilda Crease, MD 11/20/20 2351

## 2020-11-20 NOTE — ED Notes (Signed)
Pt refused labs and requested that we pull from her port.

## 2020-11-20 NOTE — ED Provider Notes (Signed)
Emergency Medicine Provider Triage Evaluation Note  Chasty Randal , a 30 y.o. female  was evaluated in triage.  Pt complains of chest pain and hypokalemia.  Was told that her potassium remains low at 2.1 on labs checked a few days ago.  Unsure if she is having shortness of breath.  Review of Systems  Positive: Chest pain Negative: Falls  Physical Exam  BP 138/81 (BP Location: Left Arm)   Pulse 89   Temp 97.8 F (36.6 C)   Resp 18   SpO2 98%  Gen:   Awake, no distress   Resp:  Normal effort  MSK:   Moves extremities without difficulty  Other:  No signs of respiratory distress  Medical Decision Making  Medically screening exam initiated at 6:05 PM.  Appropriate orders placed.  Hanifa Chastity Noland was informed that the remainder of the evaluation will be completed by another provider, this initial triage assessment does not replace that evaluation, and the importance of remaining in the ED until their evaluation is complete.  Will recheck labs   Dietrich Pates, PA-C 11/20/20 1806    Tegeler, Canary Brim, MD 11/20/20 [phone]

## 2020-11-20 NOTE — ED Notes (Addendum)
Pt refusing to keep electrodes on skin for cardiac monitoring. RN explained the importance of cardiac monitoring. Pt acknowledged this and removed electrodes again. Dr. Blinda Leatherwood made aware.

## 2020-11-21 LAB — URINALYSIS, ROUTINE W REFLEX MICROSCOPIC
Bilirubin Urine: NEGATIVE
Glucose, UA: NEGATIVE mg/dL
Ketones, ur: NEGATIVE mg/dL
Leukocytes,Ua: NEGATIVE
Nitrite: NEGATIVE
Protein, ur: NEGATIVE mg/dL
Specific Gravity, Urine: 1.006 (ref 1.005–1.030)
pH: 8 (ref 5.0–8.0)

## 2020-11-21 LAB — TROPONIN I (HIGH SENSITIVITY): Troponin I (High Sensitivity): 4 ng/L (ref <18)

## 2020-11-21 NOTE — Discharge Summary (Signed)
Physician Discharge Summary  Brandy [name] WUX:[id number] DOB: [birth year] DOA: 11/13/2020  PCP: Loni Muse, MD  Admit date: 11/13/2020 Discharge date: 11/13/20  Admitted From: home, HD unit Disposition:  Home  Recommendations for Outpatient Follow-up:  1. Follow up with PCP in 1 weeks 2. See neurology as scheduled 3. Please follow up on the following pending results:  Home Health: Yes - to be arranged by Dr. Gordy Levan, [facility](YES/NO) Equipment/Devices: Maintain HD catheter until removed by IR  Discharge Condition: stable  CODE STATUS:Full Code  Diet recommendation: Enteral feedings plus oral  Brief/Interim Summary: Larkin Ina 30 y/o with medical history of Myasthenia gravis - serum negative, Ehlers-Danlos syndrome, mast cell actigvation syndrome, POTS, protein calorie malnutrition s/p PEG and sysautonomia. She was electively admited to HD unit for plasma exchange. [facility] exchange lab revealed K 2.3. Dr. Leonel Ramsay, attending neurologist, recommended observation admission for K replenishment. Patient refuses oral potassium due to intolerance and thus requires IV K.  She was adamant about declining overnight admission but did agree to observation admission for IV K but insisted that she leave hospital by 5:45 PM.  See nursing notes for details. Patient left AMA at approximately 5:45.  Prior to discharge - at the time of admission - Dr. Gordy Levan, [facility], was contacted. She reported that patient was known to the clinic but had not yet been seen, having missed several appointments. Her office was handling home health and home infusion arrangements and would continue to work on these issues. Ms. Cormany also has an existing appointment with this office.  At the patient's request [facility] infusion was contacted and an order was given for IV K 20 meq daily during her daily IVF infusion x 4 days with a K level to be drawn on the 5th day, results to be sent to Dr  Gordy Levan.      Discharge Diagnoses:  Active Problems:   Hypokalemia   Severe protein-calorie malnutrition (HCC)   Myasthenia gravis (HCC)   PTSD (post-traumatic stress disorder)   Absolute anemia    Discharge Instructions- patient left AMA but had been informed of Dr. Ferd Glassing involvement and she was instructed to keep follow up appointment as scheduled.    Allergies as of 11/13/2020      Reactions   Aspirin Other (See Comments)   Unknown reaction   Nsaids Other (See Comments)   Unknown reaction   Quinolones Other (See Comments)   Unknown reaction   Sulfa Antibiotics Other (See Comments)   Unknown reaction   Telithromycin Other (See Comments)   Unknown reaction   Fludrocortisone Other (See Comments)   Unknown reaction   Ambien [zolpidem] Other (See Comments)   Unknown reaction   Amitriptyline Other (See Comments)   Unknown reaction   Beef-potatoes-spinach [compleat] Other (See Comments)   Unknown reaction   Benadryl [diphenhydramine] Swelling   Tongue swelling - reaction to tablets and liquid - tolerates injections and benadryl cream   Botulinum Toxins Other (See Comments)   Unknown reaction   Chlorhexidine Hives   Ciprofloxacin Other (See Comments)   Unknown reaction   Codeine Other (See Comments)   Unknown reaction   Contrast Media [iodinated Diagnostic Agents] Other (See Comments)   Unknown reaction   Creon [pancrelipase (lip-prot-amyl)] Other (See Comments)   Unknown reaction   Cymbalta [duloxetine Hcl] Other (See Comments)   Unknown reaction   Dextrans Other (See Comments)   Unknown reaction   Dilaudid [hydromorphone] Other (See Comments)   Severe migraine,  SVT, chest pain   Ergotamine Other (See Comments)   Unknown reaction   Fluoxetine Other (See Comments)   Unknown reaction - reaction to All SSRIs   Gabapentin Other (See Comments)   Unknown reaction   Gallamine Other (See Comments)   Unknown reaction   Hydrocodone Other (See Comments)   Unknown  reaction   Iodine Other (See Comments)   Unknown reaction   Lactose Intolerance (gi) Other (See Comments)   Unknown reaction   Levofloxacin Other (See Comments)   Unknown reaction   Lunesta [eszopiclone] Other (See Comments)   Unknown reaction   Macrolides And Ketolides Other (See Comments)   Unknown reaction   Maprotiline Other (See Comments)   Unknown reaction   Marplan [isocarboxazid] Other (See Comments)   Any MAOI contraindicated - unknown reaction   Midodrine Other (See Comments)   Unknown reaction   Morphine And Related Other (See Comments)   Severe migraine, SVT, chest pain, throat starts to close   Nitrous Oxide Other (See Comments)   Unknown reaction   Oxycodone Other (See Comments)   Unknown reaction   Pethidine [meperidine] Other (See Comments)   Unknown reaction   Phenergan [promethazine] Other (See Comments)   Unknown reaction   Phenobarbital Other (See Comments)   Unknown reaction   Pyridium [phenazopyridine] Other (See Comments)   Unknown reaction   Reglan [metoclopramide] Other (See Comments)   Unknown reaction   Remeron [mirtazapine] Other (See Comments)   Noradrenergic antagonist   Salicylates Other (See Comments)   Unknown reaction   Savella [milnacipran] Other (See Comments)   Unknown reaction   Scopolamine Other (See Comments)   Unknown reaction   Sumatriptan Other (See Comments)   Unknown reaction   Tape Other (See Comments)   Unknown reaction   Topiramate Other (See Comments)   Unknown reaction   Tricyclic Antidepressants Other (See Comments)   Unknown reaction   Tums [calcium Carbonate] Nausea And Vomiting, Swelling, Other (See Comments)   Tongue swelling, migraine, heartburn   Vancomycin Other (See Comments)   Unknown reaction   Verapamil Other (See Comments)   Unknown reaction      Medication List    ASK your doctor about these medications   albuterol 108 (90 Base) MCG/ACT inhaler Commonly known as: VENTOLIN HFA Inhale into the  lungs every 6 (six) hours as needed for wheezing or shortness of breath.   baclofen 10 MG tablet Commonly known as: LIORESAL Take 1 tablet (10 mg total) by mouth See admin instructions. Take one 10 mg tablet four times a day at 6AM, 12PM, 6PM, and 12AM   Corlanor 5 MG Tabs tablet Generic drug: ivabradine Take 1 tablet (5 mg total) by mouth 2 (two) times daily. 6am,6pm   cromolyn 100 MG/5ML solution Commonly known as: GASTROCROM Take 100 mg by mouth See admin instructions. Take 5 mls (100 mg) by mouth 4 times daily - before meals and at bedtime and as needed for mucous secretions   cromolyn 20 MG/2ML nebulizer solution Commonly known as: INTAL Take 20 mg by nebulization See admin instructions. Inhale one vial (20 mg) via nebulization  3 times a day and as needed for mucous secretions   cromolyn 5.2 MG/ACT nasal spray Commonly known as: NASALCROM Place 1 spray into both nostrils See admin instructions. Instill one spray into each nostril 3 times daily and as needed for excess mucus   diphenhydrAMINE-zinc acetate cream Commonly known as: BENADRYL Apply 1-2 application topically daily as needed (Rash from Mast  Cell and Allergies).   EPINEPHrine 0.3 mg/0.3 mL Soaj injection Commonly known as: EPI-PEN Inject 0.3 mg into the muscle once as needed for anaphylaxis.   famotidine 40 MG tablet Commonly known as: PEPCID Take 40 mg by mouth See admin instructions. Take one 40 mg tablet twice a day at 6AM and 6PM.   ipratropium-albuterol 0.5-2.5 (3) MG/3ML Soln Commonly known as: DUONEB Inhale 3 mLs into the lungs See admin instructions. Inhale one vial (3 mls) via nebulizer four times daily and as needed for shortness of breath/wheezing   ketotifen 0.025 % ophthalmic solution Commonly known as: ZADITOR Place 1 drop into both eyes See admin instructions. Instil one drop into both eyes 3 times daily and as needed for Mast cell, Allergies, MCS/EI   levocetirizine 5 MG tablet Commonly known  as: XYZAL Take 10 mg by mouth in the morning and at bedtime. 6am,6pm   LORazepam 0.5 MG tablet Commonly known as: ATIVAN Take 1 tablet (0.5 mg total) by mouth 2 (two) times daily as needed for anxiety.   nystatin cream Commonly known as: MYCOSTATIN Apply to affected area 2 times daily   ondansetron 4 MG disintegrating tablet Commonly known as: ZOFRAN-ODT Take 4 mg by mouth every 8 (eight) hours as needed for nausea or vomiting.   ondansetron 8 MG disintegrating tablet Commonly known as: ZOFRAN-ODT Take 8 mg by mouth every 4 (four) hours as needed for nausea or vomiting.   ondansetron 8 MG tablet Commonly known as: ZOFRAN Take 8 mg by mouth every 6 (six) hours as needed for nausea or vomiting.   Potassium Citrate 99 MG Caps Take 99 mg by mouth daily. 6 or 9pm   predniSONE 5 MG tablet Commonly known as: DELTASONE Take 5 tablets (25 mg total) by mouth daily with breakfast. Ask about: Which instructions should I use?   predniSONE 10 MG tablet Commonly known as: DELTASONE Take 20 mg by mouth every morning. Ask about: Which instructions should I use?   pyridostigmine 60 MG tablet Commonly known as: MESTINON Take 1.5 tablets (90 mg total) by mouth every 3 (three) hours. 12am,3am,6am,9am,12pm,3pm,6pm,9pm   vitamin B-12 1000 MCG tablet Commonly known as: CYANOCOBALAMIN Take 1,000 mcg by mouth daily. 6 or 9pm   Vitamin D (Ergocalciferol) 1.25 MG (50000 UNIT) Caps capsule Commonly known as: DRISDOL Take 50,000 Units by mouth 2 (two) times a week.   Wixela Inhub 250-50 MCG/ACT Aepb Generic drug: fluticasone-salmeterol Inhale 1 puff into the lungs 2 (two) times daily. 6am and 6pm   zafirlukast 20 MG tablet Commonly known as: ACCOLATE Take 1 tablet (20 mg total) by mouth 2 (two) times daily before a meal. 6am 6pm       Allergies  Allergen Reactions  . Aspirin Other (See Comments)    Unknown reaction  . Nsaids Other (See Comments)    Unknown reaction  . Quinolones  Other (See Comments)    Unknown reaction  . Sulfa Antibiotics Other (See Comments)    Unknown reaction   . Telithromycin Other (See Comments)    Unknown reaction   . Fludrocortisone Other (See Comments)    Unknown reaction   . Ambien [Zolpidem] Other (See Comments)    Unknown reaction  . Amitriptyline Other (See Comments)    Unknown reaction   . Beef-Potatoes-Spinach [Compleat] Other (See Comments)    Unknown reaction   . Benadryl [Diphenhydramine] Swelling    Tongue swelling - reaction to tablets and liquid - tolerates injections and benadryl cream  . Botulinum  Toxins Other (See Comments)    Unknown reaction   . Chlorhexidine Hives  . Ciprofloxacin Other (See Comments)    Unknown reaction   . Codeine Other (See Comments)    Unknown reaction   . Contrast Media [Iodinated Diagnostic Agents] Other (See Comments)    Unknown reaction   . Creon [Pancrelipase (Lip-Prot-Amyl)] Other (See Comments)    Unknown reaction   . Cymbalta [Duloxetine Hcl] Other (See Comments)    Unknown reaction   . Dextrans Other (See Comments)    Unknown reaction   . Dilaudid [Hydromorphone] Other (See Comments)    Severe migraine, SVT, chest pain  . Ergotamine Other (See Comments)    Unknown reaction   . Fluoxetine Other (See Comments)    Unknown reaction - reaction to All SSRIs   . Gabapentin Other (See Comments)    Unknown reaction   . Gallamine Other (See Comments)    Unknown reaction   . Hydrocodone Other (See Comments)    Unknown reaction   . Iodine Other (See Comments)    Unknown reaction   . Lactose Intolerance (Gi) Other (See Comments)    Unknown reaction   . Levofloxacin Other (See Comments)    Unknown reaction   . Lunesta [Eszopiclone] Other (See Comments)    Unknown reaction   . Macrolides And Ketolides Other (See Comments)    Unknown reaction   . Maprotiline Other (See Comments)    Unknown reaction   . Marplan [Isocarboxazid] Other (See Comments)    Any  MAOI contraindicated - unknown reaction  . Midodrine Other (See Comments)    Unknown reaction   . Morphine And Related Other (See Comments)    Severe migraine, SVT, chest pain, throat starts to close  . Nitrous Oxide Other (See Comments)    Unknown reaction   . Oxycodone Other (See Comments)    Unknown reaction   . Pethidine [Meperidine] Other (See Comments)    Unknown reaction   . Phenergan [Promethazine] Other (See Comments)    Unknown reaction   . Phenobarbital Other (See Comments)    Unknown reaction   . Pyridium [Phenazopyridine] Other (See Comments)    Unknown reaction   . Reglan [Metoclopramide] Other (See Comments)    Unknown reaction   . Remeron [Mirtazapine] Other (See Comments)    Noradrenergic antagonist  . Salicylates Other (See Comments)    Unknown reaction   . Savella [Milnacipran] Other (See Comments)    Unknown reaction   . Scopolamine Other (See Comments)    Unknown reaction   . Sumatriptan Other (See Comments)    Unknown reaction   . Tape Other (See Comments)    Unknown reaction   . Topiramate Other (See Comments)    Unknown reaction   . Tricyclic Antidepressants Other (See Comments)    Unknown reaction   . Tums [Calcium Carbonate] Nausea And Vomiting, Swelling and Other (See Comments)    Tongue swelling, migraine, heartburn  . Vancomycin Other (See Comments)    Unknown reaction   . Verapamil Other (See Comments)    Unknown reaction     Consultations:  Neuorlogy - Dr. Leonel Ramsay   Procedures/Studies: DG Chest 2 View  Result Date: 11/20/2020 CLINICAL DATA:  Chest pain EXAM: CHEST - 2 VIEW COMPARISON:  07/09/2020 FINDINGS: Right Port-A-Cath remains in place, unchanged. Interval placement of left dialysis catheter with tip in the right atrium. No pneumothorax. Bilateral pleural effusions, moderate on the left and small on the right. Bilateral lower lobe  atelectasis or infiltrates. Heart is normal size. IMPRESSION: Bibasilar  atelectasis or consolidation with bilateral effusions, left greater than right. Electronically Signed   By: Rolm Baptise M.D.   On: 11/20/2020 19:12   IR US Guide Vasc Access Left  Result Date: 11/03/2020 INDICATION: History Esther's-Danlos, Mast cell activation syndrome and Myasthenia Gravis, in need of intravenous access for plasmapheresis. EXAM: TUNNELED CENTRAL VENOUS HEMODIALYSIS CATHETER PLACEMENT WITH ULTRASOUND AND FLUOROSCOPIC GUIDANCE MEDICATIONS: The patient is currently admitted to the hospital receiving intravenous antibiotics. The antibiotic was given in an appropriate time interval prior to skin puncture. ANESTHESIA/SEDATION: None, per patient request FLUOROSCOPY TIME:  42 seconds (2 mGy). COMPLICATIONS: None immediate. PROCEDURE: Informed written consent was obtained from the patient after a discussion of the risks, benefits, and alternatives to treatment. Questions regarding the procedure were encouraged and answered. Given the presence of the existing right jugular approach port a catheter decision was made to place a left internal jugular approach dialysis catheter. As such, the left neck and chest were prepped with chlorhexidine in a sterile fashion, and a sterile drape was applied covering the operative field. Maximum barrier sterile technique with sterile gowns and gloves were used for the procedure. A timeout was performed prior to the initiation of the procedure. After creating a small venotomy incision, a micropuncture kit was utilized to access the internal jugular vein. Real-time ultrasound guidance was utilized for vascular access including the acquisition of a permanent ultrasound image documenting patency of the accessed vessel. The microwire was utilized to measure appropriate catheter length. A stiff Glidewire was advanced to the level of the IVC and the micropuncture sheath was exchanged for a peel-away sheath. A palindrome tunneled hemodialysis catheter measuring 19 cm from tip  to cuff was tunneled in a retrograde fashion from the anterior chest wall to the venotomy incision. The catheter was then placed through the peel-away sheath with tips ultimately positioned within the superior aspect of the right atrium. Final catheter positioning was confirmed and documented with a spot radiographic image. The catheter aspirates and flushes normally. The catheter was flushed with appropriate volume heparin dwells. The catheter exit site was secured with a 0-Prolene retention suture. The venotomy incision was closed with Steri-strips. Dressings were applied. The patient tolerated the procedure well without immediate post procedural complication. IMPRESSION: Successful placement of 19 cm tip to cuff tunneled hemodialysis catheter via the left internal jugular vein with tips terminating within the superior aspect of the right atrium. The catheter is ready for immediate use. Electronically Signed   By: Sandi Mariscal M.D.   On: 11/03/2020 15:57   DG Abd Acute W/Chest  Result Date: 10/27/2020 CLINICAL DATA:  Sepsis like presentation. Myasthenia gravis and dysautonomia. EXAM: DG ABDOMEN ACUTE WITH 1 VIEW CHEST COMPARISON:  Chest x-ray 05/28/2020, CT abdomen pelvis 07/09/2020 FINDINGS: Accessed right chest wall Port-A-Cath with tip overlying the superior cavoatrial junction. Interval removal of a left chest wall dialysis catheter. Likely gastrojejunostomy tube overlies the left mid abdomen. The heart size and mediastinal contours are within normal limits. Interval development of right lower lobe opacity as well as new retrocardiac opacity. No pulmonary edema. No pleural effusion. No pneumothorax. There is no evidence of dilated bowel loops or free intraperitoneal air. No radiopaque calculi or other significant radiographic abnormality is seen. No acute osseous abnormality. IMPRESSION: 1. Development bilateral lower lung zone airspace opacities suggestive of infection or inflammation. Followup PA and  lateral chest X-ray is recommended in 3-4 weeks following therapy to ensure resolution and  exclude underlying malignancy. 2. Nonobstructive bowel gas pattern. Electronically Signed   By: Iven Finn M.D.   On: 10/27/2020 01:28    (Echo, Carotid, EGD, Colonoscopy, ERCP)    Subjective:   Discharge Exam: Vitals:   11/13/20 1221 11/13/20 1244  BP: 127/82 114/77  Pulse: 69 72  Temp:  98.3 F (36.8 C)   Vitals:   11/13/20 1152 11/13/20 1207 11/13/20 1221 11/13/20 1244  BP: 133/76 125/78 127/82 114/77  Pulse: 70 69 69 72  Temp:    98.3 F (36.8 C)  TempSrc:    Axillary    General: Pt is alert, awake, not in acute distress Cardiovascular: RRR, S1/S2 +, no rubs, no gallops Respiratory: CTA bilaterally, no wheezing, no rhonchi Abdominal: Soft, NT, ND, bowel sounds + Extremities: no edema, no cyanosis    The results of significant diagnostics from this hospitalization (including imaging, microbiology, ancillary and laboratory) are listed below for reference.     Microbiology: No results found for this or any previous visit (from the past 240 hour(s)).   Labs: BNP (last 3 results) No results for input(s): BNP in the last 8760 hours. Basic Metabolic Panel: Recent Labs  Lab 11/20/20 1803  NA 137  K 3.5  CL 105  CO2 26  GLUCOSE 84  BUN <5*  CREATININE 0.42*  CALCIUM 8.3*  MG 1.6*   Liver Function Tests: No results for input(s): AST, ALT, ALKPHOS, BILITOT, PROT, ALBUMIN in the last 168 hours. No results for input(s): LIPASE, AMYLASE in the last 168 hours. No results for input(s): AMMONIA in the last 168 hours. CBC: Recent Labs  Lab 11/20/20 1803  WBC 8.8  NEUTROABS 6.4  HGB 7.7*  HCT 23.9*  MCV 87.2  PLT 363   Cardiac Enzymes: No results for input(s): CKTOTAL, CKMB, CKMBINDEX, TROPONINI in the last 168 hours. BNP: Invalid input(s): POCBNP CBG: No results for input(s): GLUCAP in the last 168 hours. D-Dimer No results for input(s): DDIMER in the last  72 hours. Hgb A1c No results for input(s): HGBA1C in the last 72 hours. Lipid Profile No results for input(s): CHOL, HDL, LDLCALC, TRIG, CHOLHDL, LDLDIRECT in the last 72 hours. Thyroid function studies No results for input(s): TSH, T4TOTAL, T3FREE, THYROIDAB in the last 72 hours.  Invalid input(s): FREET3 Anemia work up No results for input(s): VITAMINB12, FOLATE, FERRITIN, TIBC, IRON, RETICCTPCT in the last 72 hours. Urinalysis    Component Value Date/Time   COLORURINE YELLOW 11/21/2020 [place] 11/21/2020 0734   LABSPEC 1.006 11/21/2020 0734   PHURINE 8.0 11/21/2020 0734   GLUCOSEU NEGATIVE 11/21/2020 0734   HGBUR SMALL (A) 11/21/2020 0734   BILIRUBINUR NEGATIVE 11/21/2020 0734   KETONESUR NEGATIVE 11/21/2020 0734   PROTEINUR NEGATIVE 11/21/2020 0734   NITRITE NEGATIVE 11/21/2020 0734   LEUKOCYTESUR NEGATIVE 11/21/2020 0734   Sepsis Labs Invalid input(s): PROCALCITONIN,  WBC,  LACTICIDVEN Microbiology No results found for this or any previous visit (from the past 240 hour(s)).    SIGNED:   Adella Hare, MD  [facility] 11/21/2020, 10:34 AM Pager   If 7PM-7AM, please contact night-coverage [email or web address] Password [email or web address]

## 2020-11-21 NOTE — ED Notes (Signed)
Pt stated to RN that her potassium/saline infusion was too uncomfortable and the rate had to be slowed down. RN explained that potassium infuses at a set rate, and the infusion is expected to be uncomfortable while infusing through a port in her chest. Dr. Blinda Leatherwood made aware of pt's concerns.

## 2020-11-21 NOTE — ED Notes (Signed)
PT[facility] called  

## 2020-11-23 LAB — VITAMIN B6: Vitamin B6: 3 ug/L — ABNORMAL LOW (ref 3.4–65.2)

## 2020-11-25 LAB — VITAMIN A: Vitamin A (Retinoic Acid): <2.5 ug/dL — ABNORMAL LOW (ref 18.9–57.3)

## 2020-11-26 NOTE — Progress Notes (Signed)
Telehealth Encounter (TEXT link [phone] ) PCP Brandy Muse, MD  I connected with Brandy [name] (MRN [id number]) on 11/30/2020 by MyChart video-enabled, HIPAA-compliant telemedicine application, verified that I was speaking with the correct person using two identifiers, and that the patient was in a private environment conducive to confidentiality.  The patient agreed to proceed.   Persons participating in visit were patient and provider (registered dietitian) Brandy Center, PhD, [name], LDN, CEDRD.  Provider was located at [name] this telehealth encounter; patient was at home.  Appt start time: 1000 end time: 1100 (1 hour)  Reason for telehealth visit: Referred upon hospital discharge by [name] Brandy [name] for Medical Nutrition Therapy related to food sensitivities and eating behavior concerns. 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 POTS.  For a comprehensive list of symptoms, see Brandy [name] encounter notes from 06/16/20.  Symptoms most relevant to nutrition concerns include nausea, diarrhea, constipation, heart burn, abdominal bloating and cramping, feeling full quickly after eating, difficulty swallowing, weight loss, and bad breath.  Due to fatigue, light-headedness, and syncope, patient is wheelchair-bound.  Brandy [name] reports numerous food allergies/intolerances in addition to the 42 drug-related allergies  documented in EMR.   Brandy [name] [name] Brandy [name] in November 2021.  Nutrition concerns at that time included severe malnutrition, confirmed by labs showing low K+, Ca+, BUN, total protein, alk phos, AST, hgb, hct, ferritin, RBC, vit's A and D, and Cu.  WNR were vit's B6, B12, folate, thiamin, vit C, and Zn.  Brandy [name] had in place a J-tube, but used primarily a G-tube for tube feeding while hospitalized, which she continues at home.  She rejected all tube feeding options on the hospital's formulary (including [employer]), using instead her own TF blend, which typically includes celery, pears, rutabaga, rice, lentils, and water of which she gets at most 172m /day.  She consumes PO a limited range of foods such as dried beans made from scratch, homemade cassava root chips, Brussels sprouts, rice crisps, rice pasta, and steamed cabbage.    Assessment: Brandy [name] been hospitalized at [name]/21/20.  She said she has been having problems with her feeding tube, which "no one here knows how to fix."  Said her weight is down to ~80 lb, a loss of ~25 lb since May 23.  She is eating "very little" by mouth.  Central line was removed on 6/11; IV fluids were discontinued, as hospitalist wants her to get most fluids via J-tube.  Brandy [name] been told her hydration level is ok, but she feels her urine output is diminished, and she showed me how the veins in the back of her hand are nearly invisible.  She had a syncopal event a couple days ago, although SBP has been in the 150s to 172, which she attributes to mast cell reaction.   SMileydiwas tearful as she described a difficult 3-hr test she'd had to confirm  myasthenia gravis dx, only to find it was invalid b/c of still being on Mestinon and recent PLEX. Hospital physician has declined to consult GI so far, and Brandy [name] has not seen an [name] other than a brief visit from one who recommended a GI consult.    For recommendations and  goals, see Patient Instructions.    Intervention: Tried to help patient organize her thoughts with respect to most immediate needs, and what requests she has of current providers, acknowledging that reversing acute weight loss and malnutrition are critical.  I told [name] I will try to contact [name] Shari Sibenge to discuss her current condition and request for Nutrition and GI consults.    Follow-up: prn.    SYKES,JEANNIE   

## 2020-11-30 ENCOUNTER — Other Ambulatory Visit: Payer: Self-pay

## 2020-11-30 ENCOUNTER — Ambulatory Visit (INDEPENDENT_AMBULATORY_CARE_PROVIDER_SITE_OTHER): Payer: 59 | Admitting: Family Medicine

## 2020-11-30 DIAGNOSIS — E43 Unspecified severe protein-calorie malnutrition: Secondary | ICD-10-CM | POA: Diagnosis not present

## 2020-11-30 NOTE — Patient Instructions (Signed)
Organize your primary concerns for your hospital providers:  Tube feeding is not working. Constipation.  Mast cell activation; has no access to Benadryl that is free from additives that trigger mast cell response.   Cannot maintain adequate hydration; IV fluids discontinued before tube feeding problems have been resolved.   Severe acute weight loss and severe malnutrition

## 2020-12-10 ENCOUNTER — Telehealth: Payer: Self-pay | Admitting: *Deleted

## 2020-12-10 NOTE — Telephone Encounter (Signed)
Patient called and reported that she had a reaction to all of the tube feedings.   She also stated, "My Cap still hasn't kicked in.  Is there anyone who can come over and help out a little with cooking and dishes Friday when my caregiver can't come?" CN discussed with supervisor and responded back to patient that CN program does not usually do this and that we advise she contact agency who sends her caregiver to see if they can send a substitute. Patient declined to do this as she said her caregiver didn't get paid for Monday and had to skip a day. CN offered to contact Marlan Palau with [employer] to seek assistance or volunteer help, but patient declined my offer to call Lurena Joiner stating , "I might be alright without help. I started IV fluids back today."  CN will connect with patient later in the week.

## 2020-12-10 NOTE — Telephone Encounter (Signed)
Spoke with supervisor to discuss patient's request while caregiver is unavailable on Friday.  Agreed patient should call agency to get replacement caregiver for Friday.

## 2020-12-16 ENCOUNTER — Other Ambulatory Visit (HOSPITAL_COMMUNITY): Payer: Self-pay | Admitting: Psychiatry

## 2020-12-16 DIAGNOSIS — F411 Generalized anxiety disorder: Secondary | ICD-10-CM

## 2020-12-17 NOTE — Telephone Encounter (Signed)
Pt is under the care of a different psychiatrist affiliated with [facility]. She needs to contact them for refills

## 2020-12-27 ENCOUNTER — Emergency Department (HOSPITAL_COMMUNITY): Payer: 59

## 2020-12-27 ENCOUNTER — Inpatient Hospital Stay (HOSPITAL_COMMUNITY): Payer: 59

## 2020-12-27 ENCOUNTER — Encounter (HOSPITAL_COMMUNITY): Payer: Self-pay | Admitting: Emergency Medicine

## 2020-12-27 ENCOUNTER — Inpatient Hospital Stay (HOSPITAL_COMMUNITY)
Admission: EM | Admit: 2020-12-27 | Discharge: 2021-01-18 | DRG: 871 | Disposition: E | Payer: 59 | Attending: Pulmonary Disease | Admitting: Pulmonary Disease

## 2020-12-27 DIAGNOSIS — Z681 Body mass index (BMI) 19 or less, adult: Secondary | ICD-10-CM

## 2020-12-27 DIAGNOSIS — G7000 Myasthenia gravis without (acute) exacerbation: Secondary | ICD-10-CM | POA: Diagnosis present

## 2020-12-27 DIAGNOSIS — Q796 Ehlers-Danlos syndrome, unspecified: Secondary | ICD-10-CM | POA: Diagnosis not present

## 2020-12-27 DIAGNOSIS — D8940 Mast cell activation, unspecified: Secondary | ICD-10-CM | POA: Diagnosis present

## 2020-12-27 DIAGNOSIS — Z66 Do not resuscitate: Secondary | ICD-10-CM | POA: Diagnosis not present

## 2020-12-27 DIAGNOSIS — I472 Ventricular tachycardia: Secondary | ICD-10-CM | POA: Diagnosis not present

## 2020-12-27 DIAGNOSIS — Z9981 Dependence on supplemental oxygen: Secondary | ICD-10-CM

## 2020-12-27 DIAGNOSIS — M797 Fibromyalgia: Secondary | ICD-10-CM | POA: Diagnosis present

## 2020-12-27 DIAGNOSIS — N179 Acute kidney failure, unspecified: Secondary | ICD-10-CM | POA: Diagnosis present

## 2020-12-27 DIAGNOSIS — E876 Hypokalemia: Secondary | ICD-10-CM | POA: Diagnosis present

## 2020-12-27 DIAGNOSIS — E872 Acidosis, unspecified: Secondary | ICD-10-CM

## 2020-12-27 DIAGNOSIS — J189 Pneumonia, unspecified organism: Secondary | ICD-10-CM

## 2020-12-27 DIAGNOSIS — Z931 Gastrostomy status: Secondary | ICD-10-CM

## 2020-12-27 DIAGNOSIS — I469 Cardiac arrest, cause unspecified: Secondary | ICD-10-CM | POA: Diagnosis present

## 2020-12-27 DIAGNOSIS — J80 Acute respiratory distress syndrome: Secondary | ICD-10-CM | POA: Diagnosis present

## 2020-12-27 DIAGNOSIS — E739 Lactose intolerance, unspecified: Secondary | ICD-10-CM | POA: Diagnosis present

## 2020-12-27 DIAGNOSIS — R64 Cachexia: Secondary | ICD-10-CM | POA: Diagnosis present

## 2020-12-27 DIAGNOSIS — Z9049 Acquired absence of other specified parts of digestive tract: Secondary | ICD-10-CM

## 2020-12-27 DIAGNOSIS — J984 Other disorders of lung: Secondary | ICD-10-CM | POA: Diagnosis not present

## 2020-12-27 DIAGNOSIS — R6521 Severe sepsis with septic shock: Secondary | ICD-10-CM | POA: Diagnosis present

## 2020-12-27 DIAGNOSIS — Z87891 Personal history of nicotine dependence: Secondary | ICD-10-CM | POA: Diagnosis not present

## 2020-12-27 DIAGNOSIS — A419 Sepsis, unspecified organism: Principal | ICD-10-CM | POA: Diagnosis present

## 2020-12-27 DIAGNOSIS — Z7952 Long term (current) use of systemic steroids: Secondary | ICD-10-CM

## 2020-12-27 DIAGNOSIS — R0902 Hypoxemia: Secondary | ICD-10-CM

## 2020-12-27 DIAGNOSIS — R627 Adult failure to thrive: Secondary | ICD-10-CM | POA: Diagnosis present

## 2020-12-27 DIAGNOSIS — I509 Heart failure, unspecified: Secondary | ICD-10-CM

## 2020-12-27 DIAGNOSIS — Z20822 Contact with and (suspected) exposure to covid-19: Secondary | ICD-10-CM | POA: Diagnosis present

## 2020-12-27 DIAGNOSIS — E874 Mixed disorder of acid-base balance: Secondary | ICD-10-CM | POA: Diagnosis present

## 2020-12-27 DIAGNOSIS — R0600 Dyspnea, unspecified: Secondary | ICD-10-CM | POA: Diagnosis present

## 2020-12-27 DIAGNOSIS — E43 Unspecified severe protein-calorie malnutrition: Secondary | ICD-10-CM | POA: Diagnosis present

## 2020-12-27 DIAGNOSIS — G931 Anoxic brain damage, not elsewhere classified: Secondary | ICD-10-CM | POA: Diagnosis present

## 2020-12-27 DIAGNOSIS — I5021 Acute systolic (congestive) heart failure: Secondary | ICD-10-CM | POA: Diagnosis not present

## 2020-12-27 DIAGNOSIS — D65 Disseminated intravascular coagulation [defibrination syndrome]: Secondary | ICD-10-CM | POA: Diagnosis present

## 2020-12-27 DIAGNOSIS — J188 Other pneumonia, unspecified organism: Secondary | ICD-10-CM | POA: Diagnosis present

## 2020-12-27 DIAGNOSIS — Z452 Encounter for adjustment and management of vascular access device: Secondary | ICD-10-CM

## 2020-12-27 DIAGNOSIS — R778 Other specified abnormalities of plasma proteins: Secondary | ICD-10-CM

## 2020-12-27 DIAGNOSIS — J9601 Acute respiratory failure with hypoxia: Secondary | ICD-10-CM

## 2020-12-27 DIAGNOSIS — R4020 Unspecified coma: Secondary | ICD-10-CM | POA: Diagnosis present

## 2020-12-27 DIAGNOSIS — G9340 Encephalopathy, unspecified: Secondary | ICD-10-CM | POA: Diagnosis not present

## 2020-12-27 DIAGNOSIS — F4310 Post-traumatic stress disorder, unspecified: Secondary | ICD-10-CM | POA: Diagnosis present

## 2020-12-27 DIAGNOSIS — Z79899 Other long term (current) drug therapy: Secondary | ICD-10-CM

## 2020-12-27 DIAGNOSIS — F411 Generalized anxiety disorder: Secondary | ICD-10-CM | POA: Diagnosis present

## 2020-12-27 DIAGNOSIS — D6489 Other specified anemias: Secondary | ICD-10-CM | POA: Diagnosis present

## 2020-12-27 LAB — POCT I-STAT 7, (LYTES, BLD GAS, ICA,H+H)
Acid-base deficit: 9 mmol/L — ABNORMAL HIGH (ref 0.0–2.0)
Acid-base deficit: 5 mmol/L — ABNORMAL HIGH (ref 0.0–2.0)
Bicarbonate: 22.2 mmol/L (ref 20.0–28.0)
Bicarbonate: 24.9 mmol/L (ref 20.0–28.0)
Calcium, Ion: 1.06 mmol/L — ABNORMAL LOW (ref 1.15–1.40)
Calcium, Ion: 1.6 mmol/L (ref 1.15–1.40)
HCT: 24 % — ABNORMAL LOW (ref 36.0–46.0)
HCT: 18 % — ABNORMAL LOW (ref 36.0–46.0)
Hemoglobin: 8.2 g/dL — ABNORMAL LOW (ref 12.0–15.0)
Hemoglobin: 6.1 g/dL — CL (ref 12.0–15.0)
O2 Saturation: 71 %
O2 Saturation: 98 %
Patient temperature: 36.5
Patient temperature: 35.5
Potassium: 3.2 mmol/L — ABNORMAL LOW (ref 3.5–5.1)
Potassium: 2.6 mmol/L — CL (ref 3.5–5.1)
Sodium: 134 mmol/L — ABNORMAL LOW (ref 135–145)
Sodium: 140 mmol/L (ref 135–145)
TCO2: 25 mmol/L (ref 22–32)
TCO2: 27 mmol/L (ref 22–32)
pCO2 arterial: 87.3 mmHg (ref 32.0–48.0)
pCO2 arterial: 77.5 mmHg (ref 32.0–48.0)
pH, Arterial: 7.01 — CL (ref 7.350–7.450)
pH, Arterial: 7.107 — CL (ref 7.350–7.450)
pO2, Arterial: 55 mmHg — ABNORMAL LOW (ref 83.0–108.0)
pO2, Arterial: 151 mmHg — ABNORMAL HIGH (ref 83.0–108.0)

## 2020-12-27 LAB — COMPREHENSIVE METABOLIC PANEL
ALT: 12 U/L (ref 0–44)
AST: 20 U/L (ref 15–41)
Albumin: 1.7 g/dL — ABNORMAL LOW (ref 3.5–5.0)
Alkaline Phosphatase: 36 U/L — ABNORMAL LOW (ref 38–126)
Anion gap: 16 — ABNORMAL HIGH (ref 5–15)
BUN: 17 mg/dL (ref 6–20)
CO2: 22 mmol/L (ref 22–32)
Calcium: 7.7 mg/dL — ABNORMAL LOW (ref 8.9–10.3)
Chloride: 95 mmol/L — ABNORMAL LOW (ref 98–111)
Creatinine, Ser: 0.91 mg/dL (ref 0.44–1.00)
GFR, Estimated: >60 mL/min (ref >60)
Glucose, Bld: 98 mg/dL (ref 70–99)
Potassium: 3 mmol/L — ABNORMAL LOW (ref 3.5–5.1)
Sodium: 133 mmol/L — ABNORMAL LOW (ref 135–145)
Total Bilirubin: 2.3 mg/dL — ABNORMAL HIGH (ref 0.3–1.2)
Total Protein: 4.5 g/dL — ABNORMAL LOW (ref 6.5–8.1)

## 2020-12-27 LAB — BLOOD CULTURE ID PANEL (REFLEXED) - BCID2
A.calcoaceticus-baumannii: NOT DETECTED
Bacteroides fragilis: NOT DETECTED
CTX-M ESBL: NOT DETECTED
Candida albicans: NOT DETECTED
Candida auris: NOT DETECTED
Candida glabrata: NOT DETECTED
Candida krusei: NOT DETECTED
Candida parapsilosis: NOT DETECTED
Candida tropicalis: NOT DETECTED
Carbapenem resist OXA 48 LIKE: NOT DETECTED
Carbapenem resistance IMP: NOT DETECTED
Carbapenem resistance KPC: NOT DETECTED
Carbapenem resistance NDM: NOT DETECTED
Carbapenem resistance VIM: NOT DETECTED
Cryptococcus neoformans/gattii: NOT DETECTED
Enterobacter cloacae complex: NOT DETECTED
Enterobacterales: DETECTED — AB
Enterococcus Faecium: NOT DETECTED
Enterococcus faecalis: NOT DETECTED
Escherichia coli: NOT DETECTED
Haemophilus influenzae: NOT DETECTED
Klebsiella aerogenes: NOT DETECTED
Klebsiella oxytoca: NOT DETECTED
Klebsiella pneumoniae: NOT DETECTED
Listeria monocytogenes: NOT DETECTED
Neisseria meningitidis: NOT DETECTED
Proteus species: NOT DETECTED
Pseudomonas aeruginosa: DETECTED — AB
Salmonella species: NOT DETECTED
Serratia marcescens: NOT DETECTED
Staphylococcus aureus (BCID): NOT DETECTED
Staphylococcus epidermidis: NOT DETECTED
Staphylococcus lugdunensis: NOT DETECTED
Staphylococcus species: NOT DETECTED
Stenotrophomonas maltophilia: NOT DETECTED
Streptococcus agalactiae: NOT DETECTED
Streptococcus pneumoniae: NOT DETECTED
Streptococcus pyogenes: NOT DETECTED
Streptococcus species: NOT DETECTED

## 2020-12-27 LAB — RESPIRATORY PANEL BY PCR

## 2020-12-27 LAB — LACTIC ACID, PLASMA
Lactic Acid, Venous: 4.7 mmol/L (ref 0.5–1.9)
Lactic Acid, Venous: 3.7 mmol/L (ref 0.5–1.9)
Lactic Acid, Venous: >11 mmol/L (ref 0.5–1.9)

## 2020-12-27 LAB — ECHOCARDIOGRAM COMPLETE
AR max vel: 2.99 cm2
AV Area VTI: 3 cm2
AV Area mean vel: 2.83 cm2
AV Mean grad: 2 mmHg
AV Peak grad: 4.2 mmHg
Ao pk vel: 1.02 m/s
Height: 64 in
S' Lateral: 2.8 cm
Weight: 1600 oz

## 2020-12-27 LAB — CBC WITH DIFFERENTIAL/PLATELET
Abs Immature Granulocytes: 0.01 10*3/uL (ref 0.00–0.07)
Basophils Absolute: 0 10*3/uL (ref 0.0–0.1)
Basophils Relative: 1 %
Eosinophils Absolute: 0 10*3/uL (ref 0.0–0.5)
Eosinophils Relative: 0 %
HCT: 26 % — ABNORMAL LOW (ref 36.0–46.0)
Hemoglobin: 7.9 g/dL — ABNORMAL LOW (ref 12.0–15.0)
Immature Granulocytes: 1 %
Lymphocytes Relative: 22 %
Lymphs Abs: 0.2 10*3/uL — ABNORMAL LOW (ref 0.7–4.0)
MCH: 28.3 pg (ref 26.0–34.0)
MCHC: 30.4 g/dL (ref 30.0–36.0)
MCV: 93.2 fL (ref 80.0–100.0)
Monocytes Absolute: 0.1 10*3/uL (ref 0.1–1.0)
Monocytes Relative: 6 %
Neutro Abs: 0.7 10*3/uL — ABNORMAL LOW (ref 1.7–7.7)
Neutrophils Relative %: 70 %
Platelets: 23 10*3/uL — CL (ref 150–400)
RBC: 2.79 MIL/uL — ABNORMAL LOW (ref 3.87–5.11)
RDW: 15.9 % — ABNORMAL HIGH (ref 11.5–15.5)
WBC: 1 10*3/uL — CL (ref 4.0–10.5)
nRBC: 2 % — ABNORMAL HIGH (ref 0.0–0.2)

## 2020-12-27 LAB — I-STAT ARTERIAL BLOOD GAS, ED
Acid-base deficit: 10 mmol/L — ABNORMAL HIGH (ref 0.0–2.0)
Bicarbonate: 20.8 mmol/L (ref 20.0–28.0)
Calcium, Ion: 1.15 mmol/L (ref 1.15–1.40)
HCT: 24 % — ABNORMAL LOW (ref 36.0–46.0)
Hemoglobin: 8.2 g/dL — ABNORMAL LOW (ref 12.0–15.0)
O2 Saturation: 75 %
Patient temperature: 97.5
Potassium: 3 mmol/L — ABNORMAL LOW (ref 3.5–5.1)
Sodium: 133 mmol/L — ABNORMAL LOW (ref 135–145)
TCO2: 23 mmol/L (ref 22–32)
pCO2 arterial: 76.2 mmHg (ref 32.0–48.0)
pH, Arterial: 7.04 — CL (ref 7.350–7.450)
pO2, Arterial: 57 mmHg — ABNORMAL LOW (ref 83.0–108.0)

## 2020-12-27 LAB — I-STAT VENOUS BLOOD GAS, ED
Acid-base deficit: 1 mmol/L (ref 0.0–2.0)
Bicarbonate: 23.8 mmol/L (ref 20.0–28.0)
Calcium, Ion: 1.05 mmol/L — ABNORMAL LOW (ref 1.15–1.40)
HCT: 31 % — ABNORMAL LOW (ref 36.0–46.0)
Hemoglobin: 10.5 g/dL — ABNORMAL LOW (ref 12.0–15.0)
O2 Saturation: 76 %
Potassium: 3 mmol/L — ABNORMAL LOW (ref 3.5–5.1)
Sodium: 132 mmol/L — ABNORMAL LOW (ref 135–145)
TCO2: 25 mmol/L (ref 22–32)
pCO2, Ven: 37.9 mmHg — ABNORMAL LOW (ref 44.0–60.0)
pH, Ven: 7.407 (ref 7.250–7.430)
pO2, Ven: 41 mmHg (ref 32.0–45.0)

## 2020-12-27 LAB — URINALYSIS, ROUTINE W REFLEX MICROSCOPIC
Bilirubin Urine: NEGATIVE
Glucose, UA: NEGATIVE mg/dL
Hgb urine dipstick: NEGATIVE
Ketones, ur: 20 mg/dL — AB
Nitrite: NEGATIVE
Protein, ur: 30 mg/dL — AB
Specific Gravity, Urine: 1.015 (ref 1.005–1.030)
pH: 6 (ref 5.0–8.0)

## 2020-12-27 LAB — MAGNESIUM
Magnesium: 1.7 mg/dL (ref 1.7–2.4)
Magnesium: 1.6 mg/dL — ABNORMAL LOW (ref 1.7–2.4)

## 2020-12-27 LAB — CK: Total CK: 36 U/L — ABNORMAL LOW (ref 38–234)

## 2020-12-27 LAB — COOXEMETRY PANEL
Carboxyhemoglobin: 0.2 % — ABNORMAL LOW (ref 0.5–1.5)
Methemoglobin: 0.9 % (ref 0.0–1.5)
O2 Saturation: 41.7 %
Total hemoglobin: 9.4 g/dL — ABNORMAL LOW (ref 12.0–16.0)

## 2020-12-27 LAB — GLUCOSE, CAPILLARY
Glucose-Capillary: 137 mg/dL — ABNORMAL HIGH (ref 70–99)
Glucose-Capillary: 157 mg/dL — ABNORMAL HIGH (ref 70–99)
Glucose-Capillary: 173 mg/dL — ABNORMAL HIGH (ref 70–99)

## 2020-12-27 LAB — I-STAT BETA HCG BLOOD, ED (MC, WL, AP ONLY): I-stat hCG, quantitative: <5 m[IU]/mL (ref <5)

## 2020-12-27 LAB — I-STAT CHEM 8, ED
BUN: 15 mg/dL (ref 6–20)
Calcium, Ion: 1.03 mmol/L — ABNORMAL LOW (ref 1.15–1.40)
Chloride: 93 mmol/L — ABNORMAL LOW (ref 98–111)
Creatinine, Ser: 0.8 mg/dL (ref 0.44–1.00)
Glucose, Bld: 89 mg/dL (ref 70–99)
HCT: 32 % — ABNORMAL LOW (ref 36.0–46.0)
Hemoglobin: 10.9 g/dL — ABNORMAL LOW (ref 12.0–15.0)
Potassium: 3 mmol/L — ABNORMAL LOW (ref 3.5–5.1)
Sodium: 132 mmol/L — ABNORMAL LOW (ref 135–145)
TCO2: 22 mmol/L (ref 22–32)

## 2020-12-27 LAB — BRAIN NATRIURETIC PEPTIDE: B Natriuretic Peptide: 614.4 pg/mL — ABNORMAL HIGH (ref 0.0–100.0)

## 2020-12-27 LAB — PREPARE RBC (CROSSMATCH)

## 2020-12-27 LAB — CBG MONITORING, ED: Glucose-Capillary: 73 mg/dL (ref 70–99)

## 2020-12-27 LAB — PROTIME-INR
INR: 2.5 — ABNORMAL HIGH (ref 0.8–1.2)
Prothrombin Time: 26.8 seconds — ABNORMAL HIGH (ref 11.4–15.2)

## 2020-12-27 LAB — TROPONIN I (HIGH SENSITIVITY)
Troponin I (High Sensitivity): 64 ng/L — ABNORMAL HIGH (ref <18)
Troponin I (High Sensitivity): 70 ng/L — ABNORMAL HIGH (ref <18)
Troponin I (High Sensitivity): 238 ng/L (ref <18)

## 2020-12-27 LAB — PHOSPHORUS
Phosphorus: 3.9 mg/dL (ref 2.5–4.6)
Phosphorus: 4 mg/dL (ref 2.5–4.6)

## 2020-12-27 LAB — MRSA NEXT GEN BY PCR, NASAL: MRSA by PCR Next Gen: NOT DETECTED

## 2020-12-27 LAB — RESP PANEL BY RT-PCR (FLU A&B, COVID) ARPGX2
Influenza A by PCR: NEGATIVE
Influenza B by PCR: NEGATIVE
SARS Coronavirus 2 by RT PCR: NEGATIVE

## 2020-12-27 LAB — D-DIMER, QUANTITATIVE: D-Dimer, Quant: 2.36 ug/mL-FEU — ABNORMAL HIGH (ref 0.00–0.50)

## 2020-12-27 LAB — LIPASE, BLOOD: Lipase: 15 U/L (ref 11–51)

## 2020-12-27 MED ORDER — SODIUM CHLORIDE 0.9% IV SOLUTION: Freq: Once | INTRAVENOUS | Status: AC

## 2020-12-27 MED ORDER — SODIUM CHLORIDE 0.9 % IV BOLUS: 500.0000 mL | Freq: Once | INTRAVENOUS | Status: DC

## 2020-12-27 MED ORDER — SODIUM BICARBONATE 8.4 % IV SOLN
100.0000 meq | Freq: Once | INTRAVENOUS | Status: DC
  Filled 2020-12-27: qty 100

## 2020-12-27 MED ORDER — FENTANYL CITRATE (PF) 100 MCG/2ML IJ SOLN
50.0000 ug | INTRAMUSCULAR | Status: DC | PRN
  Administered 2020-12-27 (×2): 50 ug via INTRAVENOUS
  Filled 2020-12-27: qty 2

## 2020-12-27 MED ORDER — POLYETHYLENE GLYCOL 3350 17 G PO PACK: 17.0000 g | PACK | Freq: Every day | ORAL | Status: DC | PRN

## 2020-12-27 MED ORDER — THIAMINE HCL 100 MG/ML IJ SOLN
500.0000 mg | INTRAVENOUS | Status: DC
  Administered 2020-12-28: 500 mg via INTRAVENOUS
  Filled 2020-12-27: qty 5

## 2020-12-27 MED ORDER — CALCIUM GLUCONATE-NACL 1-0.675 GM/50ML-% IV SOLN
1.0000 g | Freq: Once | INTRAVENOUS | Status: DC
  Filled 2020-12-27: qty 50

## 2020-12-27 MED ORDER — CALCIUM GLUCONATE 10 % IV SOLN
1.0000 g | Freq: Once | INTRAVENOUS | Status: DC
  Filled 2020-12-27 (×2): qty 10

## 2020-12-27 MED ORDER — SODIUM BICARBONATE 8.4 % IV SOLN
INTRAVENOUS | Status: AC
  Administered 2020-12-27: 100 meq via INTRAVENOUS
  Filled 2020-12-27: qty 100

## 2020-12-27 MED ORDER — VANCOMYCIN HCL IN DEXTROSE 1-5 GM/200ML-% IV SOLN
1000.0000 mg | Freq: Once | INTRAVENOUS | Status: AC
  Administered 2020-12-27: 1000 mg via INTRAVENOUS
  Filled 2020-12-27: qty 200

## 2020-12-27 MED ORDER — VITAL HIGH PROTEIN PO LIQD
1000.0000 mL | ORAL | Status: DC
  Filled 2020-12-27: qty 1000

## 2020-12-27 MED ORDER — DOCUSATE SODIUM 50 MG/5ML PO LIQD: 100.0000 mg | Freq: Two times a day (BID) | ORAL | Status: DC

## 2020-12-27 MED ORDER — LORAZEPAM 1 MG PO TABS
0.5000 mg | ORAL_TABLET | Freq: Once | ORAL | Status: AC
  Administered 2020-12-27: 0.5 mg via ORAL
  Filled 2020-12-27: qty 1

## 2020-12-27 MED ORDER — SODIUM CHLORIDE 0.9 % IV SOLN: INTRAVENOUS | Status: DC | PRN

## 2020-12-27 MED ORDER — SODIUM BICARBONATE 8.4 % IV SOLN
50.0000 meq | Freq: Once | INTRAVENOUS | Status: AC
  Administered 2020-12-27: 50 meq via INTRAVENOUS

## 2020-12-27 MED ORDER — HYDROCORTISONE NA SUCCINATE PF 100 MG IJ SOLR
50.0000 mg | Freq: Four times a day (QID) | INTRAMUSCULAR | Status: DC
  Administered 2020-12-27 – 2020-12-28 (×2): 50 mg via INTRAVENOUS
  Filled 2020-12-27 (×2): qty 2

## 2020-12-27 MED ORDER — FENTANYL CITRATE (PF) 100 MCG/2ML IJ SOLN: 50.0000 ug | INTRAMUSCULAR | Status: DC | PRN

## 2020-12-27 MED ORDER — FENTANYL BOLUS VIA INFUSION
50.0000 ug | INTRAVENOUS | Status: DC | PRN
  Filled 2020-12-27: qty 100

## 2020-12-27 MED ORDER — EPINEPHRINE HCL 5 MG/250ML IV SOLN IN NS
0.5000 ug/min | INTRAVENOUS | Status: DC
  Administered 2020-12-28 (×2): 40 ug/min via INTRAVENOUS
  Filled 2020-12-27 (×2): qty 250

## 2020-12-27 MED ORDER — VASOPRESSIN 20 UNITS/100 ML INFUSION FOR SHOCK
0.0000 [IU]/min | INTRAVENOUS | Status: DC
  Administered 2020-12-27 – 2020-12-28 (×2): 0.03 [IU]/min via INTRAVENOUS
  Filled 2020-12-27 (×3): qty 100

## 2020-12-27 MED ORDER — VANCOMYCIN HCL 500 MG IV SOLR
500.0000 mg | Freq: Two times a day (BID) | INTRAVENOUS | Status: DC
  Filled 2020-12-27: qty 500

## 2020-12-27 MED ORDER — SODIUM BICARBONATE 8.4 % IV SOLN: 100.0000 meq | Freq: Once | INTRAVENOUS | Status: AC

## 2020-12-27 MED ORDER — EPINEPHRINE 0.3 MG/0.3ML IJ SOAJ
0.3000 mg | Freq: Once | INTRAMUSCULAR | Status: DC
  Filled 2020-12-27: qty 0.3

## 2020-12-27 MED ORDER — ADULT MULTIVITAMIN LIQUID CH
15.0000 mL | Freq: Every day | ORAL | Status: DC
  Filled 2020-12-27 (×2): qty 15

## 2020-12-27 MED ORDER — FENTANYL 2500MCG IN NS 250ML (10MCG/ML) PREMIX INFUSION
50.0000 ug/h | INTRAVENOUS | Status: DC
  Administered 2020-12-27: 50 ug/h via INTRAVENOUS
  Filled 2020-12-27: qty 250

## 2020-12-27 MED ORDER — EPINEPHRINE 1 MG/10ML IJ SOSY
PREFILLED_SYRINGE | INTRAMUSCULAR | Status: AC | PRN
  Administered 2020-12-27: 1 mg via INTRAVENOUS

## 2020-12-27 MED ORDER — SODIUM CHLORIDE 0.9 % IV BOLUS: 1000.0000 mL | Freq: Once | INTRAVENOUS | Status: DC

## 2020-12-27 MED ORDER — PROSOURCE TF PO LIQD
45.0000 mL | Freq: Two times a day (BID) | ORAL | Status: DC
  Filled 2020-12-27 (×2): qty 45

## 2020-12-27 MED ORDER — VASOPRESSIN 20 UNITS/100 ML INFUSION FOR SHOCK: 0.0000 [IU]/min | INTRAVENOUS | Status: DC

## 2020-12-27 MED ORDER — SODIUM CHLORIDE 0.9 % IV SOLN
1.0000 g | Freq: Three times a day (TID) | INTRAVENOUS | Status: DC
  Administered 2020-12-27: 1 g via INTRAVENOUS
  Filled 2020-12-27 (×3): qty 1

## 2020-12-27 MED ORDER — SODIUM CHLORIDE 0.9 % IV SOLN
1.0000 g | Freq: Once | INTRAVENOUS | Status: AC
  Administered 2020-12-27: 1 g via INTRAVENOUS
  Filled 2020-12-27: qty 10

## 2020-12-27 MED ORDER — EPINEPHRINE HCL 5 MG/250ML IV SOLN IN NS
INTRAVENOUS | Status: AC
  Administered 2020-12-27: 1 ug/min via INTRAVENOUS
  Filled 2020-12-27: qty 250

## 2020-12-27 MED ORDER — POTASSIUM CHLORIDE 10 MEQ/50ML IV SOLN
10.0000 meq | INTRAVENOUS | Status: AC
  Administered 2020-12-27 – 2020-12-28 (×3): 10 meq via INTRAVENOUS
  Filled 2020-12-27 (×3): qty 50

## 2020-12-27 MED ORDER — CALCIUM CHLORIDE 10 % IV SOLN
1.0000 g | Freq: Once | INTRAVENOUS | Status: AC
  Administered 2020-12-27: 1 g via INTRAVENOUS

## 2020-12-27 MED ORDER — POTASSIUM CHLORIDE 20 MEQ PO PACK
40.0000 meq | PACK | Freq: Two times a day (BID) | ORAL | Status: DC
  Filled 2020-12-27: qty 2

## 2020-12-27 MED ORDER — PIPERACILLIN-TAZOBACTAM 3.375 G IVPB
3.3750 g | Freq: Three times a day (TID) | INTRAVENOUS | Status: DC
  Administered 2020-12-27: 3.375 g via INTRAVENOUS
  Filled 2020-12-27: qty 50

## 2020-12-27 MED ORDER — PANTOPRAZOLE SODIUM 40 MG PO TBEC
40.0000 mg | DELAYED_RELEASE_TABLET | Freq: Every day | ORAL | Status: DC
  Filled 2020-12-27: qty 1

## 2020-12-27 MED ORDER — ONDANSETRON HCL 4 MG/2ML IJ SOLN: 4.0000 mg | Freq: Four times a day (QID) | INTRAMUSCULAR | Status: DC | PRN

## 2020-12-27 MED ORDER — ENOXAPARIN SODIUM 40 MG/0.4ML IJ SOSY
40.0000 mg | PREFILLED_SYRINGE | INTRAMUSCULAR | Status: DC
  Administered 2020-12-27: 40 mg via SUBCUTANEOUS
  Filled 2020-12-27: qty 0.4

## 2020-12-27 MED ORDER — LACTATED RINGERS IV BOLUS
1000.0000 mL | Freq: Once | INTRAVENOUS | Status: AC
  Administered 2020-12-27: 1000 mL via INTRAVENOUS

## 2020-12-27 MED ORDER — NOREPINEPHRINE 4 MG/250ML-% IV SOLN
0.0000 ug/min | INTRAVENOUS | Status: DC
Start: 2020-12-27 — End: 2020-12-28
  Administered 2020-12-27: 28 ug/min via INTRAVENOUS
  Administered 2020-12-27: 33 ug/min via INTRAVENOUS
  Administered 2020-12-27: 40 ug/min via INTRAVENOUS
  Administered 2020-12-27: 30 ug/min via INTRAVENOUS
  Administered 2020-12-28: 40 ug/min via INTRAVENOUS
  Filled 2020-12-27 (×6): qty 250

## 2020-12-27 MED ORDER — SODIUM CHLORIDE 0.9 % IV SOLN: 2.0000 g | Freq: Three times a day (TID) | INTRAVENOUS | Status: DC

## 2020-12-27 MED ORDER — DOCUSATE SODIUM 100 MG PO CAPS: 100.0000 mg | ORAL_CAPSULE | Freq: Two times a day (BID) | ORAL | Status: DC | PRN

## 2020-12-27 MED ORDER — SODIUM BICARBONATE 8.4 % IV SOLN
100.0000 meq | Freq: Once | INTRAVENOUS | Status: AC
  Administered 2020-12-27: 100 meq via INTRAVENOUS
  Filled 2020-12-27: qty 100

## 2020-12-27 MED ORDER — SODIUM CHLORIDE 0.9 % IV SOLN
100.0000 mg | Freq: Once | INTRAVENOUS | Status: AC
  Administered 2020-12-27: 100 mg via INTRAVENOUS
  Filled 2020-12-27: qty 100

## 2020-12-27 MED ORDER — POLYETHYLENE GLYCOL 3350 17 G PO PACK: 17.0000 g | PACK | Freq: Every day | ORAL | Status: DC

## 2020-12-27 NOTE — ED Notes (Signed)
Patient not letting RN administer epi pen due to needing other medications. This RN having difficulty understanding patients needs. EDP notified and is going to speak with patient.

## 2020-12-27 NOTE — Consult Note (Signed)
NEUROLOGY CONSULTATION NOTE   Date of service: Jan 24, 2021 Patient Name: Brandy [name] MRN:  [id number] DOB:  [birth year] Reason for consult: "Cardiac arrest, concern for anoxic injury" Requesting Provider: Josephine Igo, DO _ _ _   _ __   _ __ _ _  __ __   _ __   __ _  History of Present Illness  Brandy [name] is a 30 y.o. female with PMH significant for MDD, PTSD, anxiety, Ehler-Danlos syndrome with cervical instability, mast cell activation syndrome, POTS, no hx of myasthenia(rule out with negative antibodies and negative single fiber EMG) who presented with shortness of breath and fatigue and concern for allergic reaction. She was found to have multifocal pneumonia.  In the afternoon, she was found minimally responsive and worsening bradycardia by her RN. Heart rate dropped to 33 and then subsequntly, had a PEA arrest. She received 2 rounds of CPR and Epi. She was intubated without any sedation or paralytic and in the ICU.  CT Head was obtained and was negative for ICH or large area of hypodensity concerning for a stroke. She continues to be comatose in the ICU. She is on 3 pressors including Norepi, epi and Vasopressin. Labs with low Hb of 6.1 and low platelet of 23. She is on cEEG.   ROS  Unable to obtain 2/2 intubated and comatose.  Past History   Past Medical History:  Diagnosis Date   Dysautonomia (HCC) 06/28/2020   Ehlers-Danlos syndrome 06/28/2020   Mast cell activation syndrome (HCC) 06/28/2020   Myasthenia gravis (HCC)    POTS (postural orthostatic tachycardia syndrome) 06/28/2020   Severe protein-calorie malnutrition (HCC) 05/20/2020   Past Surgical History:  Procedure Laterality Date   IR FLUORO GUIDE CV LINE LEFT  11/03/2020   IR GJ TUBE CHANGE  07/11/2020   IR REMOVAL TUN CV CATH W/O FL  06/03/2020   IR US GUIDE VASC ACCESS LEFT  11/03/2020   Family History  Adopted: Yes   Social History   Socioeconomic History   Marital status: Single    Spouse name: Not  on file   Number of children: Not on file   Years of education: Not on file   Highest education level: Not on file  Occupational History   Not on file  Tobacco Use   Smoking status: Former    Pack years: 0.00   Smokeless tobacco: Never  Substance and Sexual Activity   Alcohol use: Not on file   Drug use: Not on file   Sexual activity: Not on file  Other Topics Concern   Not on file  Social History Narrative   Not on file   Social Determinants of Health   Financial Resource Strain: Not on file  Food Insecurity: Not on file  Transportation Needs: Not on file  Physical Activity: Not on file  Stress: Not on file  Social Connections: Not on file   Allergies  Allergen Reactions   Aspirin Other (See Comments)    Unknown reaction   Nsaids Other (See Comments)    Unknown reaction   Quinolones Other (See Comments)    Unknown reaction   Sulfa Antibiotics Other (See Comments)    Unknown reaction    Telithromycin Other (See Comments)    Unknown reaction    Fludrocortisone Other (See Comments)    Unknown reaction    Ambien [Zolpidem] Other (See Comments)    Unknown reaction   Amitriptyline Other (See Comments)    Unknown reaction  Beef-Potatoes-Spinach [Compleat] Other (See Comments)    Unknown reaction    Benadryl [Diphenhydramine] Swelling    Tongue swelling - reaction to tablets and liquid - tolerates injections and benadryl cream   Botulinum Toxins Other (See Comments)    Unknown reaction    Chlorhexidine Hives   Ciprofloxacin Other (See Comments)    Unknown reaction    Codeine Other (See Comments)    Unknown reaction    Contrast Media [Iodinated Diagnostic Agents] Other (See Comments)    Unknown reaction    Creon [Pancrelipase (Lip-Prot-Amyl)] Other (See Comments)    Unknown reaction    Cymbalta [Duloxetine Hcl] Other (See Comments)    Unknown reaction    Dextrans Other (See Comments)    Unknown reaction    Dilaudid [Hydromorphone] Other (See  Comments)    Severe migraine, SVT, chest pain   Ergotamine Other (See Comments)    Unknown reaction    Fluoxetine Other (See Comments)    Unknown reaction - reaction to All SSRIs    Gabapentin Other (See Comments)    Unknown reaction    Gallamine Other (See Comments)    Unknown reaction    Hydrocodone Other (See Comments)    Unknown reaction    Iodine Other (See Comments)    Unknown reaction    Lactose Intolerance (Gi) Other (See Comments)    Unknown reaction    Levofloxacin Other (See Comments)    Unknown reaction    Lunesta [Eszopiclone] Other (See Comments)    Unknown reaction    Macrolides And Ketolides Other (See Comments)    Unknown reaction    Maprotiline Other (See Comments)    Unknown reaction    Marplan [Isocarboxazid] Other (See Comments)    Any MAOI contraindicated - unknown reaction   Midodrine Other (See Comments)    Unknown reaction    Morphine And Related Other (See Comments)    Severe migraine, SVT, chest pain, throat starts to close   Nitrous Oxide Other (See Comments)    Unknown reaction    Oxycodone Other (See Comments)    Unknown reaction    Pethidine [Meperidine] Other (See Comments)    Unknown reaction    Phenergan [Promethazine] Other (See Comments)    Unknown reaction    Phenobarbital Other (See Comments)    Unknown reaction    Pyridium [Phenazopyridine] Other (See Comments)    Unknown reaction    Reglan [Metoclopramide] Other (See Comments)    Unknown reaction    Remeron [Mirtazapine] Other (See Comments)    Noradrenergic antagonist   Salicylates Other (See Comments)    Unknown reaction    Savella [Milnacipran] Other (See Comments)    Unknown reaction    Scopolamine Other (See Comments)    Unknown reaction    Sumatriptan Other (See Comments)    Unknown reaction    Tape Other (See Comments)    Unknown reaction    Topiramate Other (See Comments)    Unknown reaction    Tricyclic Antidepressants Other (See  Comments)    Unknown reaction    Tums [Calcium Carbonate] Nausea And Vomiting, Swelling and Other (See Comments)    Tongue swelling, migraine, heartburn   Vancomycin Other (See Comments)    Unknown reaction    Verapamil Other (See Comments)    Unknown reaction     Medications   Medications Prior to Admission  Medication Sig Dispense Refill Last Dose   albuterol (VENTOLIN HFA) 108 (90 Base) MCG/ACT inhaler Inhale into the lungs every  6 (six) hours as needed for wheezing or shortness of breath.   12/26/2020   baclofen (LIORESAL) 10 MG tablet Take 1 tablet (10 mg total) by mouth See admin instructions. Take one 10 mg tablet four times a day at 6AM, 12PM, 6PM, and 12AM 90 each 0 12/26/2020 at 1800   CORLANOR 5 MG TABS tablet Take 1 tablet (5 mg total) by mouth 2 (two) times daily. 6am,6pm 60 tablet 0 12/26/2020 at 1800   cromolyn (GASTROCROM) 100 MG/5ML solution Take 100 mg by mouth See admin instructions. Take 5 mls (100 mg) by mouth 4 times daily - before meals and at bedtime and as needed for mucous secretions   12/26/2020   cromolyn (INTAL) 20 MG/2ML nebulizer solution Take 20 mg by nebulization See admin instructions. Inhale one vial (20 mg) via nebulization  3 times a day and as needed for mucous secretions   12/26/2020   cromolyn (NASALCROM) 5.2 MG/ACT nasal spray Place 1 spray into both nostrils See admin instructions. Instill one spray into each nostril 3 times daily and as needed for excess mucus   12/26/2020   diphenhydrAMINE (BENADRYL) 25 mg capsule Take 25 mg by mouth every 6 (six) hours as needed.   12/26/2020   diphenhydrAMINE-zinc acetate (BENADRYL) cream Apply 1-2 application topically daily as needed (Rash from Mast Cell and Allergies).   UNK   EPINEPHrine 0.3 mg/0.3 mL IJ SOAJ injection Inject 0.3 mg into the muscle once as needed for anaphylaxis.   UNK   famotidine (PEPCID) 40 MG tablet Take 40 mg by mouth See admin instructions. Take one 40 mg tablet twice a day at 6AM and 6PM.    12/26/2020   ipratropium-albuterol (DUONEB) 0.5-2.5 (3) MG/3ML SOLN Inhale 3 mLs into the lungs See admin instructions. Inhale one vial (3 mls) via nebulizer four times daily and as needed for shortness of breath/wheezing   12/26/2020   ketotifen (ZADITOR) 0.025 % ophthalmic solution Place 1 drop into both eyes See admin instructions. Instil one drop into both eyes 3 times daily and as needed for Mast cell, Allergies, MCS/EI   12/26/2020   levocetirizine (XYZAL) 5 MG tablet Take 10 mg by mouth in the morning and at bedtime. 6am,6pm   12/26/2020   LORazepam (ATIVAN) 0.5 MG tablet Take 1 tablet (0.5 mg total) by mouth 2 (two) times daily as needed for anxiety. (Patient taking differently: Take 0.5 mg by mouth See admin instructions. Take 2-3 times as needed for anxiety) 60 tablet 0 12/26/2020   Potassium Citrate 99 MG CAPS Take 99 mg by mouth daily. 6 or 9pm   12/26/2020   predniSONE (DELTASONE) 10 MG tablet Take 40 mg by mouth every morning.   12/26/2020   pyridostigmine (MESTINON) 60 MG tablet Take 1.5 tablets (90 mg total) by mouth every 3 (three) hours. 12am,3am,6am,9am,12pm,3pm,6pm,9pm (Patient taking differently: Take 30 mg by mouth every 3 (three) hours. 12am,3am,6am,9am,12pm,3pm,6pm,9pm) 90 tablet 0 12/26/2020   vitamin B-12 (CYANOCOBALAMIN) 1000 MCG tablet Take 1,000 mcg by mouth daily. 6 or 9pm   UNK   WIXELA INHUB 250-50 MCG/ACT AEPB Inhale 1 puff into the lungs 2 (two) times daily. 6am and 6pm   Past Week   zafirlukast (ACCOLATE) 20 MG tablet Take 1 tablet (20 mg total) by mouth 2 (two) times daily before a meal. 6am 6pm 60 tablet 0 12/26/2020   nystatin cream (MYCOSTATIN) Apply to affected area 2 times daily (Patient not taking: Reported on 12/19/2020) 30 g 0 Not Taking   ondansetron (  ZOFRAN) 8 MG tablet Take 8 mg by mouth every 6 (six) hours as needed for nausea or vomiting. (Patient not taking: Reported on 2021-01-21)   Not Taking   ondansetron (ZOFRAN-ODT) 4 MG disintegrating tablet Take 4 mg by mouth every 8  (eight) hours as needed for nausea or vomiting. (Patient not taking: No sig reported)   Not Taking   ondansetron (ZOFRAN-ODT) 8 MG disintegrating tablet Take 8 mg by mouth every 4 (four) hours as needed for nausea or vomiting. (Patient not taking: Reported on 01-21-21)   Not Taking   predniSONE (DELTASONE) 5 MG tablet Take 5 tablets (25 mg total) by mouth daily with breakfast. (Patient not taking: No sig reported) 150 tablet 1 Not Taking   Vitamin D, Ergocalciferol, (DRISDOL) 1.25 MG (50000 UNIT) CAPS capsule Take 50,000 Units by mouth 2 (two) times a week. (Patient not taking: No sig reported)   Not Taking     Vitals   Vitals:   2021-01-21 2020 01-21-2021 2025 21-Jan-2021 2030 2021/01/21 2035  BP:  (!) 92/40 (!) 88/35 (!) 85/36  Pulse:   (!) 137   Resp: (!) 30 (!) 30 (!) 29 (!) 32  Temp: (!) 97.5 F (36.4 C) (!) 97.5 F (36.4 C) (!) 97.5 F (36.4 C) 97.7 F (36.5 C)  TempSrc:      SpO2:   (!) 86%   Weight:      Height:         Body mass index is 18.89 kg/m.  Physical Exam   General: Laying comfortably in bed; intubated. HENT: Normal oropharynx and mucosa. Normal external appearance of ears and nose.  Neck: Supple, no pain or tenderness  CV: No JVD. No peripheral edema. Pulmonary: Symmetric Chest rise. Not breathing over Vent. Abdomen: Soft to touch, non-tender. Ext: Pale but no cyanosis, edema, or deformity Skin: No rash. Normal palpation of skin.  Musculoskeletal: Normal digits and nails by inspection. No clubbing.  Neurologic Examination not on any sedation or paralytic  Mental status/Cognition: No response to voice or a loud clap. No response to nares stimulation or to noxious stimuli.  GCS: 3  Brainstem reflexes: Pupils: 6mm on the left, 5mm on the right, sluggish reaction to bright light bilaterally Corneals: absent Cough: absent Gag: absent GCS:    Motor/sensory: No response to noxious stimuli in any extremities.  Reflexes:  Right Left Comments  Pectoralis       Biceps (C5/6) 1 1   Brachioradialis (C5/6) 1 1    Triceps (C6/7) 1 1    Patellar (L3/4) 0 0    Achilles (S1)      Hoffman      Plantar     Jaw jerk    Coordination/Complex Motor:  Unable to assess.  Labs   CBC:  Recent Labs  Lab 01-21-21 0818 Jan 21, 2021 0826 01-21-21 2013 Jan 21, 2021 2107 01-21-21 2231  WBC 4.6  --  1.0*  --   --   NEUTROABS 4.0  --  0.7*  --   --   HGB 10.3*   < > 7.9* 8.2* 6.1*  HCT 33.0*   < > 26.0* 24.0* 18.0*  MCV 88.5  --  93.2  --   --   PLT 60*  --  23*  --   --    < > = values in this interval not displayed.    Basic Metabolic Panel:  Lab Results  Component Value Date   NA 140 01/21/21   K 2.6 (LL) 2021/01/21  CO2 22 01/12/2021   GLUCOSE 89 01/17/2021   BUN 15 01/06/2021   CREATININE 0.80 12/20/2020   CALCIUM 7.7 (L) 12/30/2020   GFRNONAA >60 01/09/2021   Lipid Panel: No results found for: LDLCALC HgbA1c:  Lab Results  Component Value Date   HGBA1C 4.7 (L) 05/16/2020   Urine Drug Screen: No results found for: LABOPIA, COCAINSCRNUR, LABBENZ, AMPHETMU, THCU, LABBARB  Alcohol Level No results found for: ETH  CT Head without contrast(personally reviewed): CTH was negative for a large hypodensity concerning for a large territory infarct or hyperdensity concerning for an ICH  MRI Brain: pending   Impression   Brandy [name] is a 30 y.o. female admitted with multifocal pneumonia with RUL [place] who had episode of bradycardia to 33, unresponsive, followed by brief PEA Arrest with 2 rounds of CPR and Epi. She is now in the ICU, unresponsive despite not having gotten any sedation or paralytic and is also on 3 pressors.  Neuro exam with comatose, pupils round and sluggish to bright light, no other brainstem reflex, no evidence of higher cerebral function.  High suspicion for anoxic brain injury on my evaluation today.  Recommendations  - cEEG - MRI Brain without contrast, about 72 hours from her Cardiac arrest event - Avoid  hyperthermia, hypoxia or hyponatremia. - Neurology will continue to follow along. - Ideal time for prognosis is in about 72 hours. ______________________________________________________________________  This patient is critically ill and at significant risk of neurological worsening, death and care requires constant monitoring of vital signs, hemodynamics,respiratory and cardiac monitoring, neurological assessment, discussion with family, other specialists and medical decision making of high complexity. I spent 35 minutes of neurocritical care time  in the care of  this patient. This was time spent independent of any time provided by nurse practitioner or PA.  Erick BlinksSalman Rajvi Armentor [facility] Pager Number [phone] 10/23/20  12:06 AM   Thank you for the opportunity to take part in the care of this patient. If you have any further questions, please contact the neurology consultation attending.  Signed,  Erick BlinksSalman Hannahmarie Asberry [facility] Pager Number [phone] _ _ _   _ __   _ __ _ _  __ __   _ __   __ _

## 2020-12-27 NOTE — ED Notes (Signed)
Patients friend Franchot Erichsen [phone]) requesting to speak with social work regarding patient. Friend believes the patient needs more help at home or placement. States the patient has not been eating and concerned that complications are self imposed.

## 2020-12-27 NOTE — Progress Notes (Signed)
ABG results given to [facility] MD Stretch. No vent changes at this time per MD.

## 2020-12-27 NOTE — Progress Notes (Signed)
RT transported patient from ED17 to 3M02 with RN. No complications and vital signs stable. RT will continue to monitor.

## 2020-12-27 NOTE — Progress Notes (Signed)
NAME:  Brandy [name], MRN:  [id number], DOB:  1991-04-26, LOS: 0 ADMISSION DATE:  2021/01/12, CONSULTATION DATE:  2021/01/12 REFERRING MD:  Dr. Dalene Seltzer, CHIEF COMPLAINT:  hypoxemia    History of Present Illness:   30 yo FM, PMH multiple diagnoses and allergies (51 listed), failure to thrive, severe protein calorie malnutrition, muscle wasting, extensive workup at multiple health systems. She had workup by neurology at [facility] with single fiber EMG, which was normal, she does not have sero-negative MG. Per documentation she lives alone, she has a port that she gives herself IVFs at home. She presents today to the ED with confusion, lactic acidosis of 4, cxr with BL infiltrates, ct chest with Rul cavitary pneumonia. Patient was on a NRB and PCCM was consulted for recommendations in management.   Unfortunately, she is also refusing a lot of care including lab draws.   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 rate (!) 28, height 5\' 4"  (1.626 m), weight 45.4 kg, SpO2 91 %.        Intake/Output Summary (Last 24 hours) at 2021/01/12 1411 Last data filed at January 12, 2021 1206 Gross per 24 hour  Intake 350 ml  Output --  Net 350 ml   Filed Weights   01-12-21 1009  Weight: 45.4 kg    Examination: General: young FM, frail, thin, chronically ill appearing  HENT: NCAT, temporalis wasting  Lungs: CTAB, no  wheeze  Cardiovascular: tachycardic, s1 s2 Abdomen: soft, nt nd  Extremities: no edema  Neuro: alert to voice, she will follow basic commands GU: deferred   Resolved Hospital Problem list     Assessment & Plan:   Acute hypoxemic respiratory failure  RUL cavitary pneumonia  Lactic Acidosis  Severe Sepsis  Plan:  Remains on NRB, likely can switch [facility]NC if needed  Would like to avoid intubation as I do no think she would survive mechanical support due to how frail of condition her baseline is  Cultures pending  Start zosyn + vancomycin  Give thiamine and MVI to help with lactate metabolism   Failure to thrive  Severe protein calorie malnutrition  - start tube feeds - consult RD  - at risk for re-feeding syndrome - start TF   Elevated Troponin  ?myocarditis - I dont really have a good explanation for this  - she has a port and accesses this at home, show she would be at risk for IE Plan:  ECHO ordered   Psychiatric history  - think this seems to be a driving factor    Best Practice (right click and "Reselect all SmartList Selections" daily)   Diet/type: tubefeeds DVT prophylaxis: LMWH GI prophylaxis: PPI Lines: N/A Foley:  N/A Code Status:  full code Last date of multidisciplinary goals of care discussion [I attempted to call numbers available in the chart and no family/friends answered.]  Labs   CBC: Recent Labs  Lab 01/12/2021 0818 2021/01/12 02/27/21  WBC 4.6  --   NEUTROABS 4.0  --   HGB 10.3* 10.5*  10.9*  HCT 33.0* 31.0*  32.0*  MCV 88.5  --   PLT 60*  --     Basic Metabolic Panel: Recent Labs  Lab 01/11/2021 0818 28-Dec-2020 0826  NA 133* 132*  132*  K 3.0* 3.0*  3.0*  CL 95* 93*  CO2 22  --   GLUCOSE 98 89  BUN 17 15  CREATININE 0.91 0.80  CALCIUM 7.7*  --    GFR: Estimated Creatinine Clearance: 74.4 mL/min (by C-G formula based on SCr of 0.8 mg/dL). Recent Labs  Lab 01/11/2021 0818 12/22/2020 1044  WBC 4.6  --   LATICACIDVEN 4.7* 3.7*     Liver Function Tests: Recent Labs  Lab December 28, 2020 0818  AST 20  ALT 12  ALKPHOS 36*  BILITOT 2.3*  PROT 4.5*  ALBUMIN 1.7*   Recent Labs  Lab 12/21/2020 0818  LIPASE 15   No results for input(s): AMMONIA in the last 168 hours.  ABG    Component Value Date/Time   HCO3 23.8 28-Dec-2020 0826   TCO2 25 28-Dec-2020 0826   TCO2 22 01/02/2021 0826   ACIDBASEDEF 1.0 01/10/2021 0826   O2SAT 76.0 01/08/2021 0826     Coagulation Profile: No results for input(s): INR, PROTIME in the last 168 hours.  Cardiac Enzymes: No results for input(s): CKTOTAL, CKMB, CKMBINDEX, TROPONINI in the last 168 hours.  HbA1C: Hgb A1c MFr Bld  Date/Time Value Ref Range Status  05/16/2020 03:02 PM 4.7 (L) 4.8 - 5.6 % Final    Comment:    (NOTE) Pre diabetes:          5.7%-6.4%  Diabetes:              >6.4%  Glycemic control for   <7.0% adults with diabetes     CBG: No results for input(s): GLUCAP in the last 168 hours.  Review of Systems:    Unable to obtain because patient is confused   Past Medical History:  She,  has a past medical history of Dysautonomia (HCC) (06/28/2020), Ehlers-Danlos syndrome (06/28/2020), Mast cell activation syndrome (HCC) (06/28/2020), Myasthenia gravis (HCC), POTS (postural orthostatic tachycardia syndrome) (06/28/2020), and Severe protein-calorie malnutrition (HCC) (05/20/2020).   Surgical History:   Past Surgical History:  Procedure Laterality Date   IR FLUORO GUIDE CV LINE LEFT  11/03/2020   IR GJ TUBE CHANGE  07/11/2020   IR REMOVAL TUN CV CATH W/O FL  06/03/2020   IR US GUIDE VASC ACCESS LEFT  11/03/2020     Social History:   reports that she has quit smoking. She has never used smokeless tobacco.   Family History:  Her family history is not on file. She was adopted.   Allergies Allergies  Allergen Reactions   Aspirin Other (See Comments)    Unknown reaction   Nsaids Other (See Comments)    Unknown reaction   Quinolones Other (See Comments)     Unknown reaction   Sulfa Antibiotics Other (See Comments)    Unknown reaction    Telithromycin Other (See Comments)    Unknown reaction    Fludrocortisone Other (See Comments)    Unknown reaction    Ambien [Zolpidem] Other (See Comments)    Unknown reaction   Amitriptyline Other (See Comments)    Unknown reaction    Beef-Potatoes-Spinach [Compleat] Other (See Comments)    Unknown reaction    Benadryl [Diphenhydramine] Swelling    Tongue swelling -  reaction to tablets and liquid - tolerates injections and benadryl cream   Botulinum Toxins Other (See Comments)    Unknown reaction    Chlorhexidine Hives   Ciprofloxacin Other (See Comments)    Unknown reaction    Codeine Other (See Comments)    Unknown reaction    Contrast Media [Iodinated Diagnostic Agents] Other (See Comments)    Unknown reaction    Creon [Pancrelipase (Lip-Prot-Amyl)] Other (See Comments)    Unknown reaction    Cymbalta [Duloxetine Hcl] Other (See Comments)    Unknown reaction    Dextrans Other (See Comments)    Unknown reaction    Dilaudid [Hydromorphone] Other (See Comments)    Severe migraine, SVT, chest pain   Ergotamine Other (See Comments)    Unknown reaction    Fluoxetine Other (See Comments)    Unknown reaction - reaction to All SSRIs    Gabapentin Other (See Comments)    Unknown reaction    Gallamine Other (See Comments)    Unknown reaction    Hydrocodone Other (See Comments)    Unknown reaction    Iodine Other (See Comments)    Unknown reaction    Lactose Intolerance (Gi) Other (See Comments)    Unknown reaction    Levofloxacin Other (See Comments)    Unknown reaction    Lunesta [Eszopiclone] Other (See Comments)    Unknown reaction    Macrolides And Ketolides Other (See Comments)    Unknown reaction    Maprotiline Other (See Comments)    Unknown reaction    Marplan [Isocarboxazid] Other (See Comments)    Any MAOI contraindicated - unknown reaction   Midodrine  Other (See Comments)    Unknown reaction    Morphine And Related Other (See Comments)    Severe migraine, SVT, chest pain, throat starts to close   Nitrous Oxide Other (See Comments)    Unknown reaction    Oxycodone Other (See Comments)    Unknown reaction    Pethidine [Meperidine] Other (See Comments)    Unknown reaction    Phenergan [Promethazine] Other (See Comments)    Unknown reaction    Phenobarbital Other (See Comments)    Unknown reaction    Pyridium [Phenazopyridine] Other (See Comments)    Unknown reaction    Reglan [Metoclopramide] Other (See Comments)    Unknown reaction    Remeron [Mirtazapine] Other (See Comments)    Noradrenergic antagonist   Salicylates Other (See Comments)    Unknown reaction    Savella [Milnacipran] Other (See Comments)    Unknown reaction    Scopolamine Other (See Comments)    Unknown reaction    Sumatriptan Other (See Comments)    Unknown reaction    Tape Other (See Comments)    Unknown reaction    Topiramate Other (See Comments)    Unknown reaction    Tricyclic Antidepressants Other (See Comments)    Unknown reaction    Tums [Calcium Carbonate] Nausea And Vomiting, Swelling and Other (See Comments)    Tongue swelling, migraine, heartburn   Vancomycin Other (See Comments)    Unknown reaction    Verapamil Other (See Comments)    Unknown reaction      Home Medications  Prior to Admission medications   Medication Sig Start Date End Date Taking? Authorizing Provider  albuterol (VENTOLIN HFA) 108 (90 Base) MCG/ACT inhaler Inhale into the lungs every 6 (six) hours as needed for wheezing or shortness of breath.   Yes [provider]  baclofen (  LIORESAL) 10 MG tablet Take 1 tablet (10 mg total) by mouth See admin instructions. Take one 10 mg tablet four times a day at 6AM, 12PM, 6PM, and 12AM 07/11/20  Yes Westley ChandlerBrown, Carina M, MD  CORLANOR 5 MG TABS tablet Take 1 tablet (5 mg total) by mouth 2 (two) times daily. 6am,6pm  07/11/20  Yes Westley ChandlerBrown, Carina M, MD  cromolyn (GASTROCROM) 100 MG/5ML solution Take 100 mg by mouth See admin instructions. Take 5 mls (100 mg) by mouth 4 times daily - before meals and at bedtime and as needed for mucous secretions   Yes [provider]  cromolyn (INTAL) 20 MG/2ML nebulizer solution Take 20 mg by nebulization See admin instructions. Inhale one vial (20 mg) via nebulization  3 times a day and as needed for mucous secretions   Yes [provider]  cromolyn (NASALCROM) 5.2 MG/ACT nasal spray Place 1 spray into both nostrils See admin instructions. Instill one spray into each nostril 3 times daily and as needed for excess mucus   Yes [provider]  diphenhydrAMINE (BENADRYL) 25 mg capsule Take 25 mg by mouth every 6 (six) hours as needed.   Yes [provider]  diphenhydrAMINE-zinc acetate (BENADRYL) cream Apply 1-2 application topically daily as needed (Rash from Mast Cell and Allergies).   Yes [provider]  EPINEPHrine 0.3 mg/0.3 mL IJ SOAJ injection Inject 0.3 mg into the muscle once as needed for anaphylaxis.   Yes [provider]  famotidine (PEPCID) 40 MG tablet Take 40 mg by mouth See admin instructions. Take one 40 mg tablet twice a day at 6AM and 6PM.   Yes [provider]  ipratropium-albuterol (DUONEB) 0.5-2.5 (3) MG/3ML SOLN Inhale 3 mLs into the lungs See admin instructions. Inhale one vial (3 mls) via nebulizer four times daily and as needed for shortness of breath/wheezing   Yes [provider]  ketotifen (ZADITOR) 0.025 % ophthalmic solution Place 1 drop into both eyes See admin instructions. Instil one drop into both eyes 3 times daily and as needed for Mast cell, Allergies, MCS/EI   Yes [provider]  levocetirizine (XYZAL) 5 MG tablet Take 10 mg by mouth in the morning and at bedtime. 6am,6pm 06/01/20  Yes [provider]  LORazepam (ATIVAN) 0.5 MG tablet Take 1 tablet (0.5 mg  total) by mouth 2 (two) times daily as needed for anxiety. Patient taking differently: Take 0.5 mg by mouth See admin instructions. Take 2-3 times as needed for anxiety 10/20/20  Yes Zena AmosKaur, Mandeep, MD  Potassium Citrate 99 MG CAPS Take 99 mg by mouth daily. 6 or 9pm   Yes [provider]  predniSONE (DELTASONE) 10 MG tablet Take 40 mg by mouth every morning. 11/07/20  Yes [provider]  pyridostigmine (MESTINON) 60 MG tablet Take 1.5 tablets (90 mg total) by mouth every 3 (three) hours. 12am,3am,6am,9am,12pm,3pm,6pm,9pm Patient taking differently: Take 30 mg by mouth every 3 (three) hours. 12am,3am,6am,9am,12pm,3pm,6pm,9pm 07/11/20  Yes Westley ChandlerBrown, Carina M, MD  vitamin B-12 (CYANOCOBALAMIN) 1000 MCG tablet Take 1,000 mcg by mouth daily. 6 or 9pm   Yes [provider]  WIXELA INHUB 250-50 MCG/ACT AEPB Inhale 1 puff into the lungs 2 (two) times daily. 6am and 6pm 10/09/20  Yes [provider]  zafirlukast (ACCOLATE) 20 MG tablet Take 1 tablet (20 mg total) by mouth 2 (two) times daily before a meal. 6am 6pm 07/11/20  Yes Westley ChandlerBrown, Carina M, MD  nystatin cream (MYCOSTATIN) Apply to affected area 2  times daily Patient not taking: Reported on 01/14/2021 11/11/20   Lorre Nick, MD  ondansetron (ZOFRAN) 8 MG tablet Take 8 mg by mouth every 6 (six) hours as needed for nausea or vomiting. Patient not taking: Reported on 12/25/2020    [provider]  ondansetron (ZOFRAN-ODT) 4 MG disintegrating tablet Take 4 mg by mouth every 8 (eight) hours as needed for nausea or vomiting. Patient not taking: No sig reported    [provider]  ondansetron (ZOFRAN-ODT) 8 MG disintegrating tablet Take 8 mg by mouth every 4 (four) hours as needed for nausea or vomiting. Patient not taking: Reported on 12/26/2020 11/07/20   [provider]  predniSONE (DELTASONE) 5 MG tablet Take 5 tablets (25 mg total) by mouth daily with breakfast. Patient not taking: No sig reported  05/22/20   Lewie Chamber, MD  Vitamin D, Ergocalciferol, (DRISDOL) 1.25 MG (50000 UNIT) CAPS capsule Take 50,000 Units by mouth 2 (two) times a week. Patient not taking: No sig reported    [provider]     This patient is critically ill with multiple organ system failure; which, requires frequent high complexity decision making, assessment, support, evaluation, and titration of therapies. This was completed through the application of advanced monitoring technologies and extensive interpretation of multiple databases. During this encounter critical care time was devoted to patient care services described in this note for 33 minutes.  Josephine Igo, DO [facility] 12/26/2020 2:58 PM

## 2020-12-27 NOTE — Progress Notes (Signed)
eLink Physician-Brief Progress Note Patient Name: Brandy [name] DOB: September 03, 1990 MRN: [id number]   Date of Service  12/26/2020  HPI/Events of Note  Called to room for hypotension to 81/51 (MAP 60) on levophed at 40 mcg/min (~ 1.0 mcg/kg/min) + vasopressin.  Last ABG (from 2 hours earlier) was 7.04/76/57/23/BE -10.  Patient is in ARDS with bilateral pulmonary infiltrates and RUL cavitation. She has been started on vanc/meropenem.   eICU Interventions  Called bedside team immediately to help assist in stabilization of this patient.  Increased RR to 30/min on ventilator and VT to 400 mL (8-9 mL/kg) (she was already breathing over set rate of 16 at 30/min).  Bedside team to place A-line and central line.  Epinephrine drip ordered as third pressor.  2 amps of NaHCO3 and 1g calcium gluconate administered.  Stress dose steroids started.  I also called the patient's friend, Brandy [name], who is listed as an  emergency contact and provided an update. She is taking care of the patient's 64 year old son so she will be staying at home rather than coming into the hospital. I am told, however, that the patient's foster mother is coming to the hospital shortly.     Intervention Category Major Interventions: Respiratory failure - evaluation and management;Shock - evaluation and management;Acid-Base disturbance - evaluation and management Intermediate Interventions: Communication with other healthcare providers and/or family  Janae Bridgeman 12/30/2020, 8:38 PM

## 2020-12-27 NOTE — ED Notes (Signed)
ED Provider at bedside. 

## 2020-12-27 NOTE — ED Notes (Signed)
RN notified by phlebotomy unable to obtain second set of cultures after antibiotics already started and patient did not want to be stuck anymore. MD notified.

## 2020-12-27 NOTE — Plan of Care (Signed)
Curbside discussion with Dr. Alvira Monday.   Patient is presenting with concern for allergic reaction to banana at home as well as not having taken her IV fluids at home for several days with generalized weakness.  Briefly reviewed patient's history, patient is known to me from prior presentations.  Fortunately she has recently had an EMG nerve conduction study with Dr. Annett Fabian (11/26/2020).  This was performed off of pyridostigmine and single-fiber nerve conduction study was completed and normal.  We discussed that this is most sensitive study to detect potential seronegative myasthenia gravis, and Dr. Rachael Darby is a  world renowned expert in this very technically challenging procedure  SFEMG: L Frontalis  Summary: After identifying the patient in the waiting room and reviewing all appropriately available medical records, the patient was taken back to the examination room where the procedure was explained, the sites of examination were noted, the patient's questions were answered, and the patient's verbal consent for the procedure was obtained. Studies were performed at [facility] using a radiant warmer and [employer] Synergy EMG system.  Single-fiber EMG is performed in the left frontalis muscle. Fiber density is normal. Mean consecutive difference of 20 potential pairs is normal at 31.8 s. None of the 20 pairs examined show abnormality of jitter or blocking.   Sensory and motor nerve conduction study in the left leg is normal. Needle EMG of proximal muscles in the left arm is unremarkable. Short exercise test for periodic paralysis (patient has hypokalemia) is also normal.   Conclusions: This is a normal single-fiber EMG study. Additional nerve conduction study, brief needle EMG, and periodic paralysis test are also normal.   This procedure was completed while she was hospitalized at [facility] from 6/4 through 6/19 with a discharge diagnosis of severe protein calorie malnutrition) and postural  orthostatic tachycardia syndrome.  Her tunneled line was removed on 6/10 due to no plans for further PLEX, and neurology additionally recommended discontinuing prednisone however patient was resistant to this plan and tapering was therefore deferred to an outpatient setting.  She was also continued on her home pyridostigmine 90 mg every 3 hours as needed as well as baclofen 10 mg every 6 hours daily  Additional pertinent notes from her stay there:  "Extensive work-up including GI, allergy, neurology in our hospital and at other hospitals has provided no evidence of her diagnoses as detailed in prior notes. Per our assessment and that of the psychiatry team, believe that discharge home is reasonable given the patient's inability to comply with tube feeds here, and the fact that she continues to lose weight here in the hospital despite her greatest efforts. At this point, both our team, the psychiatry team, and the patient believe she would likely tolerate p.o. intake better at home. Barrier to discharge remains arrangement of wheelchair transport. We will also arrange close follow-up in the outpatient with her primary care provider and allergy team. -- Nutrition consulted for further assessment of malnutrition -- Allergy and immunology consult; appreciate recs --No more tube feeds at this time; continue to encourage p.o. intake -- Monitor K, Ca, Mg, PO4 daily; replete as needed -- Plan for discharge when transport available"  "Of note, patient finds being in hospitals to be very traumatic. Psychiatry evaluated today as mentioned above. It is a complex scenario as she has lost weight while in the hospital as she continues to decline medical therapies such as various tube feed formulations due to her somatic symptoms. Her p.o. intake has been limited here as  well given that she has allergies to many foods and only cooks things certain ways which she only does at home. She has not let nursing staff cook for  her and every day goes to the microwave and resource room to prep her foods. --Psychiatry following --Ensure appropriate outpatient follow-up with psychotherapy --Continue home ativan 0.5mg  TID PRN with 0.5mg  IV once as needed for panic attacks"  Please do consult neurology if a full evaluation is felt to be needed or beneficial for the primary team and patient.  Otherwise based on the review of her records as above, feel that supportive care per primary team/ED is appropriate at this time, additionally agree with the ED that her venous blood gas is reassuring without evidence of retention  Brooke Dare MD-PhD [facility] [phone]

## 2020-12-27 NOTE — Hospital Course (Addendum)
Patient presented with severe shortness of breath and weakness which she reports began last night after eating a banana, which is an allergy of hers.   Troponin, d-dimer, and lactate elevated.   DDX include PNA, PE, CHF   Admit to stepdown, ECHO, consult cardiology          Past Medical History EDS Mast Cell Activation Syndrome POTS Malnutrition s/p PEG tube placement Dysautonomia Chronic Hypokalemia  Vitals:  Temp: hypothermic to 97.5 HR 112-128 RR 16-61 Bps normotensive On 3L nasal canula  Admission Labs:   Troponins 64-> 70 BNP 614.4 Lactic Acid 4.7-> 3.7 D-Dimer 2.36  Hgb 10.3 MCV 88.5  CMP 133 Na K 3.0 T bili 2.3 Anion Gap -> Lactic Acidosis  EKG: ST depressions in II,III, AvF CXR: Right Upper Lobe opacification

## 2020-12-27 NOTE — Progress Notes (Signed)
PHARMACY - PHYSICIAN COMMUNICATION CRITICAL VALUE ALERT - BLOOD CULTURE IDENTIFICATION (BCID)  Brandy [name] is an 30 y.o. female who presented to [facility] on January 26, 2021 with a chief complaint of cardiac arrest and pneumonia.  Assessment:  CXR with Extensive bilateral pneumonia, unchanged on the right and increased on the left. Piperacillin/tazobactam was expanded to meropenem. One set of blood cultures growing pseudomonas, no resistance.   Name of physician (or Provider) Contacted: Dr. Benjamin Stain  Current antibiotics: meropenem, vancomycin  Changes to prescribed antibiotics recommended:  Recommendations accepted by provider- narrow meropenem to cefepime. Continue vancomycin for now.   Results for orders placed or performed during the hospital encounter of Jan 26, 2021  Blood Culture ID Panel (Reflexed) (Collected: 01-26-2021  8:05 AM)  Result Value Ref Range   Enterococcus faecalis NOT DETECTED NOT DETECTED   Enterococcus Faecium NOT DETECTED NOT DETECTED   Listeria monocytogenes NOT DETECTED NOT DETECTED   Staphylococcus species NOT DETECTED NOT DETECTED   Staphylococcus aureus (BCID) NOT DETECTED NOT DETECTED   Staphylococcus epidermidis NOT DETECTED NOT DETECTED   Staphylococcus lugdunensis NOT DETECTED NOT DETECTED   Streptococcus species NOT DETECTED NOT DETECTED   Streptococcus agalactiae NOT DETECTED NOT DETECTED   Streptococcus pneumoniae NOT DETECTED NOT DETECTED   Streptococcus pyogenes NOT DETECTED NOT DETECTED   A.calcoaceticus-baumannii NOT DETECTED NOT DETECTED   Bacteroides fragilis NOT DETECTED NOT DETECTED   Enterobacterales DETECTED (A) NOT DETECTED   Enterobacter cloacae complex NOT DETECTED NOT DETECTED   Escherichia coli NOT DETECTED NOT DETECTED   Klebsiella aerogenes NOT DETECTED NOT DETECTED   Klebsiella oxytoca NOT DETECTED NOT DETECTED   Klebsiella pneumoniae NOT DETECTED NOT DETECTED   Proteus species NOT DETECTED NOT DETECTED   Salmonella species NOT  DETECTED NOT DETECTED   Serratia marcescens NOT DETECTED NOT DETECTED   Haemophilus influenzae NOT DETECTED NOT DETECTED   Neisseria meningitidis NOT DETECTED NOT DETECTED   Pseudomonas aeruginosa DETECTED (A) NOT DETECTED   Stenotrophomonas maltophilia NOT DETECTED NOT DETECTED   Candida albicans NOT DETECTED NOT DETECTED   Candida auris NOT DETECTED NOT DETECTED   Candida glabrata NOT DETECTED NOT DETECTED   Candida krusei NOT DETECTED NOT DETECTED   Candida parapsilosis NOT DETECTED NOT DETECTED   Candida tropicalis NOT DETECTED NOT DETECTED   Cryptococcus neoformans/gattii NOT DETECTED NOT DETECTED   CTX-M ESBL NOT DETECTED NOT DETECTED   Carbapenem resistance IMP NOT DETECTED NOT DETECTED   Carbapenem resistance KPC NOT DETECTED NOT DETECTED   Carbapenem resistance NDM NOT DETECTED NOT DETECTED   Carbapenem resist OXA 48 LIKE NOT DETECTED NOT DETECTED   Carbapenem resistance VIM NOT DETECTED NOT DETECTED    Alphia Moh, PharmD, BCPS, BCCP Clinical Pharmacist  Please check AMION for all [facility] Pharmacy phone numbers After 10:00 PM, call Main Pharmacy [phone]

## 2020-12-27 NOTE — Progress Notes (Signed)
  Echocardiogram 2D Echocardiogram has been performed.  Brandy [name] F 12/21/2020, 2:40 PM

## 2020-12-27 NOTE — Progress Notes (Signed)
Patient  arrived to unit from ED on Levo, Vasopressin,with no sedation and unresponsive. Patient placed on BS monitor. Bear hugger started, Vitals and CBG taken. MRSA swab sent and patient received bath and CHG. Handoff given to oncoming RN

## 2020-12-27 NOTE — Progress Notes (Signed)
Pharmacy Antibiotic Note  Brandy [name] is a 30 y.o. female admitted on 01/04/2021 with pneumonia.  Pharmacy has been consulted for vancomycin and meropenem dosing.  CXR with Extensive bilateral pneumonia, unchanged on the right and increased on the left. Patient is increasing acidotic after brief PEA arrest and is now intubated and requiring pressors. Received one dose of Zosyn, ceftriaxone, doxycyline and vancomycin. No history of resistant organisms but multiple hospitalizations. Bcx growing GNR. Note only 45 kg.   Plan: Stop Zosyn Start meropenem 1g q 8hr  Vancomycin 500mg  IV q 12h (eAUC 462, Goal AUC 400-550, SCr 0.8) Monitor cultures, clinical status, renal fx, vanc levels Narrow abx as able and f/u duration    Height: 5\' 1"  (154.9 cm) Weight: 45.4 kg (100 lb) IBW/kg (Calculated) : 47.8  Temp (24hrs), Avg:97.4 F (36.3 C), Min:96.7 F (35.9 C), Max:98.1 F (36.7 C)  Recent Labs  Lab 01/02/2021 0818 12/29/2020 0826 01/17/2021 1044  WBC 4.6  --   --   CREATININE 0.91 0.80  --   LATICACIDVEN 4.7*  --  3.7*    Estimated Creatinine Clearance: 74.4 mL/min (by C-G formula based on SCr of 0.8 mg/dL).    Allergies  Allergen Reactions   Aspirin Other (See Comments)    Unknown reaction   Nsaids Other (See Comments)    Unknown reaction   Quinolones Other (See Comments)    Unknown reaction   Sulfa Antibiotics Other (See Comments)    Unknown reaction    Telithromycin Other (See Comments)    Unknown reaction    Fludrocortisone Other (See Comments)    Unknown reaction    Ambien [Zolpidem] Other (See Comments)    Unknown reaction   Amitriptyline Other (See Comments)    Unknown reaction    Beef-Potatoes-Spinach [Compleat] Other (See Comments)    Unknown reaction    Benadryl [Diphenhydramine] Swelling    Tongue swelling - reaction to tablets and liquid - tolerates injections and benadryl cream   Botulinum Toxins Other (See Comments)    Unknown reaction     Chlorhexidine Hives   Ciprofloxacin Other (See Comments)    Unknown reaction    Codeine Other (See Comments)    Unknown reaction    Contrast Media [Iodinated Diagnostic Agents] Other (See Comments)    Unknown reaction    Creon [Pancrelipase (Lip-Prot-Amyl)] Other (See Comments)    Unknown reaction    Cymbalta [Duloxetine Hcl] Other (See Comments)    Unknown reaction    Dextrans Other (See Comments)    Unknown reaction    Dilaudid [Hydromorphone] Other (See Comments)    Severe migraine, SVT, chest pain   Ergotamine Other (See Comments)    Unknown reaction    Fluoxetine Other (See Comments)    Unknown reaction - reaction to All SSRIs    Gabapentin Other (See Comments)    Unknown reaction    Gallamine Other (See Comments)    Unknown reaction    Hydrocodone Other (See Comments)    Unknown reaction    Iodine Other (See Comments)    Unknown reaction    Lactose Intolerance (Gi) Other (See Comments)    Unknown reaction    Levofloxacin Other (See Comments)    Unknown reaction    Lunesta [Eszopiclone] Other (See Comments)    Unknown reaction    Macrolides And Ketolides Other (See Comments)    Unknown reaction    Maprotiline Other (See Comments)    Unknown reaction    Marplan [Isocarboxazid] Other (See Comments)  Any MAOI contraindicated - unknown reaction   Midodrine Other (See Comments)    Unknown reaction    Morphine And Related Other (See Comments)    Severe migraine, SVT, chest pain, throat starts to close   Nitrous Oxide Other (See Comments)    Unknown reaction    Oxycodone Other (See Comments)    Unknown reaction    Pethidine [Meperidine] Other (See Comments)    Unknown reaction    Phenergan [Promethazine] Other (See Comments)    Unknown reaction    Phenobarbital Other (See Comments)    Unknown reaction    Pyridium [Phenazopyridine] Other (See Comments)    Unknown reaction    Reglan [Metoclopramide] Other (See Comments)    Unknown  reaction    Remeron [Mirtazapine] Other (See Comments)    Noradrenergic antagonist   Salicylates Other (See Comments)    Unknown reaction    Savella [Milnacipran] Other (See Comments)    Unknown reaction    Scopolamine Other (See Comments)    Unknown reaction    Sumatriptan Other (See Comments)    Unknown reaction    Tape Other (See Comments)    Unknown reaction    Topiramate Other (See Comments)    Unknown reaction    Tricyclic Antidepressants Other (See Comments)    Unknown reaction    Tums [Calcium Carbonate] Nausea And Vomiting, Swelling and Other (See Comments)    Tongue swelling, migraine, heartburn   Vancomycin Other (See Comments)    Unknown reaction    Verapamil Other (See Comments)    Unknown reaction     Antimicrobials this admission: CTX 7/10  Doxycycline 7/10 Piptazo 7/10 Vanc 7/10>>  Meropenem 7/10 >>   Dose adjustments this admission:   Microbiology results: 7/10 BCx: GNR 7/10 UCx: pending  7/10 RVP neg 7/10  MRSA PCR: pend  Thank you for allowing pharmacy to be a part of this patient's care.  Alphia Moh, PharmD, BCPS, BCCP Clinical Pharmacist  Please check AMION for all [facility] Pharmacy phone numbers After 10:00 PM, call Main Pharmacy [phone]

## 2020-12-27 NOTE — ED Triage Notes (Signed)
Per [facility] pt coming from home- EMS states pt called hours ago on her life alert a unit went out and could not find patient. 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 Patient just kept saying "I need to go to hospital." Patient reports on [place] [place] at home. Wearing a respirator mask on arrival and refuses to take off due to allergies. Patient covered in feces on arrival. Baseline is bed bound.

## 2020-12-27 NOTE — Progress Notes (Signed)
EEG complete - results pending 

## 2020-12-27 NOTE — Progress Notes (Signed)
PCCM Progress Note  Called by [facility] stating that patient suffered cardiac arrest. Per report NT observed that patient suddenly became bradycardic with a heart rate drop from 125 to 33. She then proceeded to get the bedside nurse and on arrival patient was seen minimally responsive with worsening bradycardia with progression to PEA arrest. Patient received 2 sounds of CPR and EPI. During arrest patient was intubated for airway protection, She did NOT receive any medication for ET tube insertion.   On my assessment patient was observed hemodynamically stable on ventilator. Neurological exam revealed fixed and dilated bilateral pupils. Give neuro change post cardiac arrest will obtain stat head CT and obtain EEG.  Celeste Candelas D. Tiburcio Pea, NP-C [facility] Personal contact information can be found on Amion  12/18/2020, 4:08 PM

## 2020-12-27 NOTE — Progress Notes (Signed)
Pharmacy Antibiotic Note  Brandy [name] is a 30 y.o. female admitted on 2020/12/31 presenting with confusion, cxr concerning for pna.  Pharmacy has been consulted for vancomycin and zosyn dosing.  Discussed with MD and will trial vancomycin  Plan: Vancomycin 1000 mg IV x 1, then 500mg  IV q 12h (eAUC 462, Goal AUC 400-550, SCr 0.8) Zosyn 3.375g IV q 8 h (extended infusion) Add MRSA PCR Monitor renal function, Cx and clinical progression to narrow Vancomycin levels as needed  Height: 5\' 4"  (162.6 cm) Weight: 45.4 kg (100 lb) IBW/kg (Calculated) : 54.7  Temp (24hrs), Avg:97.5 F (36.4 C), Min:97.5 F (36.4 C), Max:97.5 F (36.4 C)  Recent Labs  Lab 12-31-2020 0818 12-31-20 0826 12/31/20 1044  WBC 4.6  --   --   CREATININE 0.91 0.80  --   LATICACIDVEN 4.7*  --  3.7*    Estimated Creatinine Clearance: 74.4 mL/min (by C-G formula based on SCr of 0.8 mg/dL).    Allergies  Allergen Reactions   Aspirin Other (See Comments)    Unknown reaction   Nsaids Other (See Comments)    Unknown reaction   Quinolones Other (See Comments)    Unknown reaction   Sulfa Antibiotics Other (See Comments)    Unknown reaction    Telithromycin Other (See Comments)    Unknown reaction    Fludrocortisone Other (See Comments)    Unknown reaction    Ambien [Zolpidem] Other (See Comments)    Unknown reaction   Amitriptyline Other (See Comments)    Unknown reaction    Beef-Potatoes-Spinach [Compleat] Other (See Comments)    Unknown reaction    Benadryl [Diphenhydramine] Swelling    Tongue swelling - reaction to tablets and liquid - tolerates injections and benadryl cream   Botulinum Toxins Other (See Comments)    Unknown reaction    Chlorhexidine Hives   Ciprofloxacin Other (See Comments)    Unknown reaction    Codeine Other (See Comments)    Unknown reaction    Contrast Media [Iodinated Diagnostic Agents] Other (See Comments)    Unknown reaction    Creon [Pancrelipase  (Lip-Prot-Amyl)] Other (See Comments)    Unknown reaction    Cymbalta [Duloxetine Hcl] Other (See Comments)    Unknown reaction    Dextrans Other (See Comments)    Unknown reaction    Dilaudid [Hydromorphone] Other (See Comments)    Severe migraine, SVT, chest pain   Ergotamine Other (See Comments)    Unknown reaction    Fluoxetine Other (See Comments)    Unknown reaction - reaction to All SSRIs    Gabapentin Other (See Comments)    Unknown reaction    Gallamine Other (See Comments)    Unknown reaction    Hydrocodone Other (See Comments)    Unknown reaction    Iodine Other (See Comments)    Unknown reaction    Lactose Intolerance (Gi) Other (See Comments)    Unknown reaction    Levofloxacin Other (See Comments)    Unknown reaction    Lunesta [Eszopiclone] Other (See Comments)    Unknown reaction    Macrolides And Ketolides Other (See Comments)    Unknown reaction    Maprotiline Other (See Comments)    Unknown reaction    Marplan [Isocarboxazid] Other (See Comments)    Any MAOI contraindicated - unknown reaction   Midodrine Other (See Comments)    Unknown reaction    Morphine And Related Other (See Comments)    Severe migraine, SVT, chest pain,  throat starts to close   Nitrous Oxide Other (See Comments)    Unknown reaction    Oxycodone Other (See Comments)    Unknown reaction    Pethidine [Meperidine] Other (See Comments)    Unknown reaction    Phenergan [Promethazine] Other (See Comments)    Unknown reaction    Phenobarbital Other (See Comments)    Unknown reaction    Pyridium [Phenazopyridine] Other (See Comments)    Unknown reaction    Reglan [Metoclopramide] Other (See Comments)    Unknown reaction    Remeron [Mirtazapine] Other (See Comments)    Noradrenergic antagonist   Salicylates Other (See Comments)    Unknown reaction    Savella [Milnacipran] Other (See Comments)    Unknown reaction    Scopolamine Other (See Comments)     Unknown reaction    Sumatriptan Other (See Comments)    Unknown reaction    Tape Other (See Comments)    Unknown reaction    Topiramate Other (See Comments)    Unknown reaction    Tricyclic Antidepressants Other (See Comments)    Unknown reaction    Tums [Calcium Carbonate] Nausea And Vomiting, Swelling and Other (See Comments)    Tongue swelling, migraine, heartburn   Vancomycin Other (See Comments)    Unknown reaction    Verapamil Other (See Comments)    Unknown reaction     Daylene Posey, PharmD Clinical Pharmacist ED Pharmacist Phone # [phone] 01/02/2021 2:38 PM

## 2020-12-27 NOTE — ED Provider Notes (Signed)
[facility] EMERGENCY DEPARTMENT Provider Note   CSN: [id number] Arrival date & time: 01/09/21  0730     History No chief complaint on file.   Brandy [name] is a 30 y.o. female.  HPI     30 year old female with a history of dysautonomia, Ehlers-Danlos syndrome, mast cell activation syndrome, history of myasthenia gravis (serum negative, recent normal single-fiber EMG study, with additional brief needle EMG, periodic paralysis test also normal 11/26/2020 at [facility]), POTS, PTSD, depression, fibromyalgia, severe protein calorie malnutrition with G-tube in place with condition exacerbated by allergies, on home oxygen, recent hospitalization at [facility] 6/4-6/19 for failure to thrive with consultations with nutrition, allergy and immunology, neurology and psychiatry who presents with concern for fatigue, dyspnea.  Patient reports she has not been giving herself her infusions of saline over the last couple of days.  Last night, she had a banana, and after that felt itching and shortness of breath, is concerned she is having an allergic reaction.  She denied any rash.  She had an episode of diarrhea with incontinence and is covered in stool on arrival to the ED.  She said she could not find her EpiPen, however when EMS arrived it was today at her side.  She reports that she took an Ativan.  A friend also called with concern that she had not been taking care of herself.  She denies fevers, cough. Reports she has itching. Has been able to tolerate feeds that she makes herself noting she is allergic to tube feeds.  Denies vomiting.  No rash. On ROS also reports chest and abdominal pain> Chest pain is a heaviness, has been constant since yesterday. Diffuse abdominal pain.       Past Medical History:  Diagnosis Date   Dysautonomia (HCC) 06/28/2020   Ehlers-Danlos syndrome 06/28/2020   Mast cell activation syndrome (HCC) 06/28/2020   Myasthenia gravis (HCC)    POTS (postural orthostatic  tachycardia syndrome) 06/28/2020   Severe protein-calorie malnutrition (HCC) 05/20/2020    Patient Active Problem List   Diagnosis Date Noted   Hypoxia 01-09-2021   Hypokalemia 11/13/2020   Pressure injury of skin 10/31/2020   SIRS (systemic inflammatory response syndrome) (HCC) 10/27/2020   Pneumonia of both lower lobes due to infectious organism 10/27/2020   Pain around PEG tube site 10/27/2020   Hyponatremia 10/27/2020   History of poor personal hygiene 10/27/2020   Fever 10/27/2020   Feeding tube dysfunction 07/10/2020   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   Ehlers-Danlos syndrome 06/28/2020   Uses feeding tube 06/28/2020   Restricted diet 06/28/2020   Mast cell activation syndrome (HCC) 06/28/2020   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   Chronic idiopathic urticaria 06/28/2020   POTS (postural orthostatic tachycardia syndrome) 06/28/2020   Dysautonomia (HCC) 06/28/2020   PTSD (post-traumatic stress disorder) 06/28/2020   GAD (generalized anxiety disorder) 06/28/2020   History of major depression 06/28/2020   Absolute anemia 06/28/2020   On home oxygen therapy 06/28/2020   Fibromyalgia 06/28/2020   Myasthenia gravis (HCC) 05/23/2020   Severe protein-calorie malnutrition (HCC) 05/20/2020   Myasthenia exacerbation (HCC) 05/16/2020    Past Surgical History:  Procedure Laterality Date   IR FLUORO GUIDE CV LINE LEFT  11/03/2020   IR GJ TUBE CHANGE  07/11/2020   IR REMOVAL TUN CV CATH W/O FL  06/03/2020   IR US GUIDE VASC ACCESS LEFT  11/03/2020     OB History   No obstetric history on file.     Family  History  Adopted: Yes    Social History   Tobacco Use   Smoking status: Former    Pack years: 0.00   Smokeless tobacco: Never    Home Medications Prior to Admission medications   Medication Sig Start Date End Date Taking? Authorizing Provider  albuterol (VENTOLIN HFA) 108 (90 Base) MCG/ACT inhaler Inhale into the lungs every 6 (six) hours as needed  for wheezing or shortness of breath.   Yes [provider]  baclofen (LIORESAL) 10 MG tablet Take 1 tablet (10 mg total) by mouth See admin instructions. Take one 10 mg tablet four times a day at 6AM, 12PM, 6PM, and 12AM 07/11/20  Yes Westley Chandler, MD  CORLANOR 5 MG TABS tablet Take 1 tablet (5 mg total) by mouth 2 (two) times daily. 6am,6pm 07/11/20  Yes Westley Chandler, MD  cromolyn (GASTROCROM) 100 MG/5ML solution Take 100 mg by mouth See admin instructions. Take 5 mls (100 mg) by mouth 4 times daily - before meals and at bedtime and as needed for mucous secretions   Yes [provider]  cromolyn (INTAL) 20 MG/2ML nebulizer solution Take 20 mg by nebulization See admin instructions. Inhale one vial (20 mg) via nebulization  3 times a day and as needed for mucous secretions   Yes [provider]  cromolyn (NASALCROM) 5.2 MG/ACT nasal spray Place 1 spray into both nostrils See admin instructions. Instill one spray into each nostril 3 times daily and as needed for excess mucus   Yes [provider]  diphenhydrAMINE (BENADRYL) 25 mg capsule Take 25 mg by mouth every 6 (six) hours as needed.   Yes [provider]  diphenhydrAMINE-zinc acetate (BENADRYL) cream Apply 1-2 application topically daily as needed (Rash from Mast Cell and Allergies).   Yes [provider]  EPINEPHrine 0.3 mg/0.3 mL IJ SOAJ injection Inject 0.3 mg into the muscle once as needed for anaphylaxis.   Yes [provider]  famotidine (PEPCID) 40 MG tablet Take 40 mg by mouth See admin instructions. Take one 40 mg tablet twice a day at 6AM and 6PM.   Yes [provider]  ipratropium-albuterol (DUONEB) 0.5-2.5 (3) MG/3ML SOLN Inhale 3 mLs into the lungs See admin instructions. Inhale one vial (3 mls) via nebulizer four times daily and as needed for shortness of breath/wheezing   Yes [provider]  ketotifen (ZADITOR) 0.025 % ophthalmic solution Place 1 drop  into both eyes See admin instructions. Instil one drop into both eyes 3 times daily and as needed for Mast cell, Allergies, MCS/EI   Yes [provider]  levocetirizine (XYZAL) 5 MG tablet Take 10 mg by mouth in the morning and at bedtime. 6am,6pm 06/01/20  Yes [provider]  LORazepam (ATIVAN) 0.5 MG tablet Take 1 tablet (0.5 mg total) by mouth 2 (two) times daily as needed for anxiety. Patient taking differently: Take 0.5 mg by mouth See admin instructions. Take 2-3 times as needed for anxiety 10/20/20  Yes Zena Amos, MD  Potassium Citrate 99 MG CAPS Take 99 mg by mouth daily. 6 or 9pm   Yes [provider]  predniSONE (DELTASONE) 10 MG tablet Take 40 mg by mouth every morning. 11/07/20  Yes [provider]  pyridostigmine (MESTINON) 60 MG tablet Take 1.5 tablets (90 mg total) by mouth every 3 (three) hours. 12am,3am,6am,9am,12pm,3pm,6pm,9pm Patient taking differently: Take 30 mg by mouth every 3 (three) hours. 12am,3am,6am,9am,12pm,3pm,6pm,9pm 07/11/20  Yes Westley Chandler, MD  vitamin B-12 (CYANOCOBALAMIN)  1000 MCG tablet Take 1,000 mcg by mouth daily. 6 or 9pm   Yes [provider]  WIXELA INHUB 250-50 MCG/ACT AEPB Inhale 1 puff into the lungs 2 (two) times daily. 6am and 6pm 10/09/20  Yes [provider]  zafirlukast (ACCOLATE) 20 MG tablet Take 1 tablet (20 mg total) by mouth 2 (two) times daily before a meal. 6am 6pm 07/11/20  Yes Westley Chandler, MD  nystatin cream (MYCOSTATIN) Apply to affected area 2 times daily Patient not taking: Reported on 01-09-2021 11/11/20   Lorre Nick, MD  ondansetron (ZOFRAN) 8 MG tablet Take 8 mg by mouth every 6 (six) hours as needed for nausea or vomiting. Patient not taking: Reported on 09-Jan-2021    [provider]  ondansetron (ZOFRAN-ODT) 4 MG disintegrating tablet Take 4 mg by mouth every 8 (eight) hours as needed for nausea or vomiting. Patient not taking: No sig reported    [provider]  ondansetron (ZOFRAN-ODT) 8 MG disintegrating tablet Take 8 mg by mouth every 4 (four) hours as needed for nausea or vomiting. Patient not taking: Reported on Jan 09, 2021 11/07/20   [provider]  predniSONE (DELTASONE) 5 MG tablet Take 5 tablets (25 mg total) by mouth daily with breakfast. Patient not taking: No sig reported 05/22/20   Lewie Chamber, MD  Vitamin D, Ergocalciferol, (DRISDOL) 1.25 MG (50000 UNIT) CAPS capsule Take 50,000 Units by mouth 2 (two) times a week. Patient not taking: No sig reported    [provider]    Allergies    Aspirin, Nsaids, Quinolones, Sulfa antibiotics, Telithromycin, Fludrocortisone, Ambien [zolpidem], Amitriptyline, Beef-potatoes-spinach [compleat], Benadryl [diphenhydramine], Botulinum toxins, Chlorhexidine, Ciprofloxacin, Codeine, Contrast media [iodinated diagnostic agents], Creon [pancrelipase (lip-prot-amyl)], Cymbalta [duloxetine hcl], Dextrans, Dilaudid [hydromorphone], Ergotamine, Fluoxetine, Gabapentin, Gallamine, Hydrocodone, Iodine, Lactose intolerance (gi), Levofloxacin, Lunesta [eszopiclone], Macrolides and ketolides, Maprotiline, Marplan [isocarboxazid], Midodrine, Morphine and related, Nitrous oxide, Oxycodone, Pethidine [meperidine], Phenergan [promethazine], Phenobarbital, Pyridium [phenazopyridine], Reglan [metoclopramide], Remeron [mirtazapine], Salicylates, Savella [milnacipran], Scopolamine, Sumatriptan, Tape, Topiramate, Tricyclic antidepressants, Tums [calcium carbonate], Vancomycin, and Verapamil  Review of Systems   Review of Systems  Constitutional:  Positive for activity change, appetite change and fatigue. Negative for fever.  Respiratory:  Positive for shortness of breath. Negative for cough.   Cardiovascular:  Positive for chest pain.  Gastrointestinal:  Positive for abdominal pain and diarrhea. Negative for nausea and vomiting.  Skin:  Negative for rash (but notes itching).  Neurological:   Negative for headaches.   Physical Exam Updated Vital Signs BP (!) 93/53   Pulse 66   Temp 98.1 F (36.7 C)   Resp (!) 32   Ht  (1.549 m)   Wt 45.4 kg   SpO2 90%   BMI 18.89 kg/m   Physical Exam Vitals and nursing note reviewed.  Constitutional:      Appearance: She is cachectic. She is ill-appearing and toxic-appearing. She is not diaphoretic.  HENT:     Head: Normocephalic and atraumatic.  Eyes:     Conjunctiva/sclera: Conjunctivae normal.  Cardiovascular:     Rate and Rhythm: Regular rhythm. Tachycardia present.     Heart sounds: Normal heart sounds. No murmur heard.   No friction rub. No gallop.  Pulmonary:     Effort: Pulmonary effort is normal. Tachypnea present. No respiratory distress.     Breath sounds: Normal breath sounds. No wheezing or rales.  Abdominal:     General: There is no distension.     Palpations: Abdomen is soft.  Tenderness: There is no abdominal tenderness. There is no guarding.     Comments: GT  Musculoskeletal:        General: No tenderness.     Cervical back: Normal range of motion.  Skin:    General: Skin is warm and dry.     Findings: No erythema or rash.  Neurological:     Mental Status: She is alert and oriented to person, place, and time.    ED Results / Procedures / Treatments   Labs (all labs ordered are listed, but only abnormal results are displayed) Labs Reviewed  CBC WITH DIFFERENTIAL/PLATELET - Abnormal; Notable for the following components:      Result Value   RBC 3.73 (*)    Hemoglobin 10.3 (*)    HCT 33.0 (*)    RDW 15.8 (*)    Platelets 60 (*)    Lymphs Abs 0.4 (*)    All other components within normal limits  COMPREHENSIVE METABOLIC PANEL - Abnormal; Notable for the following components:   Sodium 133 (*)    Potassium 3.0 (*)    Chloride 95 (*)    Calcium 7.7 (*)    Total Protein 4.5 (*)    Albumin 1.7 (*)    Alkaline Phosphatase 36 (*)    Total Bilirubin 2.3 (*)    Anion gap 16 (*)    All other  components within normal limits  LACTIC ACID, PLASMA - Abnormal; Notable for the following components:   Lactic Acid, Venous 4.7 (*)    All other components within normal limits  LACTIC ACID, PLASMA - Abnormal; Notable for the following components:   Lactic Acid, Venous 3.7 (*)    All other components within normal limits  URINALYSIS, ROUTINE W REFLEX MICROSCOPIC - Abnormal; Notable for the following components:   Color, Urine AMBER (*)    APPearance HAZY (*)    Ketones, ur 20 (*)    Protein, ur 30 (*)    Leukocytes,Ua MODERATE (*)    Bacteria, UA RARE (*)    All other components within normal limits  BRAIN NATRIURETIC PEPTIDE - Abnormal; Notable for the following components:   B Natriuretic Peptide 614.4 (*)    All other components within normal limits  D-DIMER, QUANTITATIVE - Abnormal; Notable for the following components:   D-Dimer, Quant 2.36 (*)    All other components within normal limits  COOXEMETRY PANEL - Abnormal; Notable for the following components:   Total hemoglobin 9.4 (*)    Carboxyhemoglobin 0.2 (*)    All other components within normal limits  I-STAT VENOUS BLOOD GAS, ED - Abnormal; Notable for the following components:   pCO2, Ven 37.9 (*)    Sodium 132 (*)    Potassium 3.0 (*)    Calcium, Ion 1.05 (*)    HCT 31.0 (*)    Hemoglobin 10.5 (*)    All other components within normal limits  I-STAT CHEM 8, ED - Abnormal; Notable for the following components:   Sodium 132 (*)    Potassium 3.0 (*)    Chloride 93 (*)    Calcium, Ion 1.03 (*)    Hemoglobin 10.9 (*)    HCT 32.0 (*)    All other components within normal limits  I-STAT ARTERIAL BLOOD GAS, ED - Abnormal; Notable for the following components:   pH, Arterial 7.040 (*)    pCO2 arterial 76.2 (*)    pO2, Arterial 57 (*)    Acid-base deficit 10.0 (*)    Sodium  133 (*)    Potassium 3.0 (*)    HCT 24.0 (*)    Hemoglobin 8.2 (*)    All other components within normal limits  TROPONIN I (HIGH SENSITIVITY)  - Abnormal; Notable for the following components:   Troponin I (High Sensitivity) 64 (*)    All other components within normal limits  TROPONIN I (HIGH SENSITIVITY) - Abnormal; Notable for the following components:   Troponin I (High Sensitivity) 70 (*)    All other components within normal limits  RESP PANEL BY RT-PCR (FLU A&B, COVID) ARPGX2  CULTURE, BLOOD (ROUTINE X 2)  CULTURE, BLOOD (ROUTINE X 2)  URINE CULTURE  EXPECTORATED SPUTUM ASSESSMENT W GRAM STAIN, RFLX TO RESP C  RESPIRATORY PANEL BY PCR  MRSA NEXT GEN BY PCR, NASAL  LIPASE, BLOOD  PHOSPHORUS  MAGNESIUM  RAPID URINE DRUG SCREEN, HOSP PERFORMED  PHOSPHORUS  MAGNESIUM  CBC  BASIC METABOLIC PANEL  PHOSPHORUS  MAGNESIUM  BLOOD GAS, ARTERIAL  I-STAT BETA HCG BLOOD, ED ([facility], [facility], [facility] ONLY)  CBG MONITORING, ED    EKG EKG Interpretation  Date/Time:  Sunday December 27 2020 07:44:55 EDT Ventricular Rate:  124 PR Interval:  105 QRS Duration: 80 QT Interval:  301 QTC Calculation: 433 R Axis:   87 Text Interpretation: Sinus tachycardia Minimal ST depression, inferior leads Since prior ECG< rate has increased Confirmed by Alvira MondaySchlossman, Levora Werden ([id number]) on 05-08-21 9:12:24 AM  Radiology CT ABDOMEN PELVIS WO CONTRAST  Result Date: 05-08-21 CLINICAL DATA:  Chest pain. Clinical concern for pleurisy or effusion. Acute, non localized abdominal pain. Lung infiltrates on a portable chest radiograph earlier today. Ehlers-Danlos syndrome. Mast cell activation syndrome. History of myasthenia gravis. EXAM: CT CHEST, ABDOMEN AND PELVIS WITHOUT CONTRAST TECHNIQUE: Multidetector CT imaging of the chest, abdomen and pelvis was performed following the standard protocol without IV contrast. COMPARISON:  Portable chest obtained earlier today. Abdomen and pelvis CT dated 07/09/2020. FINDINGS: CT CHEST FINDINGS Cardiovascular: Diffuse low density of the blood relative to the arterial walls. Normal sized heart. Right jugular catheter tip at the superior  cavoatrial junction. Mediastinum/Nodes: No enlarged mediastinal, hilar, or axillary lymph nodes. Thyroid gland, trachea, and esophagus demonstrate no significant findings. Lungs/Pleura: Extensive patchy/nodular opacities throughout both lungs, greater on the right. Areas of dense consolidation with air bronchograms and cavitation right upper lobe. Small right pleural effusion. Musculoskeletal: Unremarkable bones. CT ABDOMEN PELVIS FINDINGS Hepatobiliary: No focal liver abnormality is seen. Status post cholecystectomy. No biliary dilatation. Pancreas: Unremarkable. No pancreatic ductal dilatation or surrounding inflammatory changes. Spleen: Normal in size without focal abnormality. Adrenals/Urinary Tract: Stable small mid right renal calculus measuring 4 mm on image 64/3. Unremarkable left kidney, ureters, urinary bladder and adrenal glands. Stomach/Bowel: Percutaneous gastrojejunal tube with its tip in the jejunum in the upper right pelvis. No bowel dilatation. No bowel or gastric dilatation. Normal appearing appendix. Vascular/Lymphatic: No significant vascular findings are present. No enlarged abdominal or pelvic lymph nodes. Reproductive: Uterus and bilateral adnexa are unremarkable. Other: Interval marked cachexia. Musculoskeletal: The hands are included with arachnodactyly demonstrated, compatible with the history of Ehlers-Danlos syndrome. No acute bony abnormality. IMPRESSION: 1. Extensive patchy/nodular opacities throughout both lungs, greater on the right with areas of dense consolidation and cavitation in the right upper lobe. These are compatible with extensive changes due to atypical infection with right upper lobe cavitary pneumonia. 2. Stable small, nonobstructing right renal calculus. 3. Diffuse low density of the blood relative to the arterial walls. This can be seen with anemia. Electronically Signed  By: Beckie Salts M.D.   On: 01/12/2021 14:20   CT Head Wo Contrast  Result Date:  12/22/2020 CLINICAL DATA:  Mental status change. EXAM: CT HEAD WITHOUT CONTRAST TECHNIQUE: Contiguous axial images were obtained from the base of the skull through the vertex without intravenous contrast. COMPARISON:  None. FINDINGS: Scan positioning is suboptimal with mild motion artifact over the posterior fossa. Brain: Ventricles, cisterns and other CSF spaces are within normal. There is no mass, mass effect, shift of midline structures or acute hemorrhage. Vascular: No hyperdense vessel or unexpected calcification. Skull: Normal. Negative for fracture or focal lesion. Sinuses/Orbits: Orbits are normal. Mild opacification over the mastoid air cells. Paranasal sinuses are clear. Other: None. IMPRESSION: 1. No acute findings. 2. Mild opacification over the mastoid air cells. Electronically Signed   By: Elberta Fortis M.D.   On: 01/02/2021 16:31   CT Chest Wo Contrast  Result Date: 01/06/2021 CLINICAL DATA:  Chest pain. Clinical concern for pleurisy or effusion. Acute, non localized abdominal pain. Lung infiltrates on a portable chest radiograph earlier today. Ehlers-Danlos syndrome. Mast cell activation syndrome. History of myasthenia gravis. EXAM: CT CHEST, ABDOMEN AND PELVIS WITHOUT CONTRAST TECHNIQUE: Multidetector CT imaging of the chest, abdomen and pelvis was performed following the standard protocol without IV contrast. COMPARISON:  Portable chest obtained earlier today. Abdomen and pelvis CT dated 07/09/2020. FINDINGS: CT CHEST FINDINGS Cardiovascular: Diffuse low density of the blood relative to the arterial walls. Normal sized heart. Right jugular catheter tip at the superior cavoatrial junction. Mediastinum/Nodes: No enlarged mediastinal, hilar, or axillary lymph nodes. Thyroid gland, trachea, and esophagus demonstrate no significant findings. Lungs/Pleura: Extensive patchy/nodular opacities throughout both lungs, greater on the right. Areas of dense consolidation with air bronchograms and cavitation  right upper lobe. Small right pleural effusion. Musculoskeletal: Unremarkable bones. CT ABDOMEN PELVIS FINDINGS Hepatobiliary: No focal liver abnormality is seen. Status post cholecystectomy. No biliary dilatation. Pancreas: Unremarkable. No pancreatic ductal dilatation or surrounding inflammatory changes. Spleen: Normal in size without focal abnormality. Adrenals/Urinary Tract: Stable small mid right renal calculus measuring 4 mm on image 64/3. Unremarkable left kidney, ureters, urinary bladder and adrenal glands. Stomach/Bowel: Percutaneous gastrojejunal tube with its tip in the jejunum in the upper right pelvis. No bowel dilatation. No bowel or gastric dilatation. Normal appearing appendix. Vascular/Lymphatic: No significant vascular findings are present. No enlarged abdominal or pelvic lymph nodes. Reproductive: Uterus and bilateral adnexa are unremarkable. Other: Interval marked cachexia. Musculoskeletal: The hands are included with arachnodactyly demonstrated, compatible with the history of Ehlers-Danlos syndrome. No acute bony abnormality. IMPRESSION: 1. Extensive patchy/nodular opacities throughout both lungs, greater on the right with areas of dense consolidation and cavitation in the right upper lobe. These are compatible with extensive changes due to atypical infection with right upper lobe cavitary pneumonia. 2. Stable small, nonobstructing right renal calculus. 3. Diffuse low density of the blood relative to the arterial walls. This can be seen with anemia. Electronically Signed   By: Beckie Salts M.D.   On: 01/16/2021 14:20   DG Chest Portable 1 View  Result Date: 01/07/2021 CLINICAL DATA:  Status post intubation. EXAM: PORTABLE CHEST 1 VIEW COMPARISON:  Earlier today. FINDINGS: Interval endotracheal tube in satisfactory position. Interval nasogastric tube with its tip in the midthoracic esophagus. Stable right jugular porta catheter. Extensive bilateral airspace opacity, remaining most confluent  in the right upper lobe. This is mildly progressive on the left. Associated obscuration of right heart borders. Grossly normal sized heart. Unremarkable bones. Cholecystectomy clips. IMPRESSION: 1.  Endotracheal tube in satisfactory position. 2. Nasogastric tube tip in the mid esophagus. It is recommended that this be advanced at least 25 cm. 3. Extensive bilateral pneumonia, unchanged on the right and increased on the left. Electronically Signed   By: Beckie Salts M.D.   On: 01/03/2021 16:04   DG Chest Portable 1 View  Result Date: 01-03-21 CLINICAL DATA:  Tachycardia and generalized weakness. EXAM: PORTABLE CHEST 1 VIEW COMPARISON:  11/20/2020 FINDINGS: Right IJ Port-A-Cath unchanged. Interval development of multifocal airspace consolidation over the right lung worse in the right upper lobe. There is patchy hazy airspace opacification over the left midlung. No effusion. Cardiomediastinal silhouette and remainder of the exam is unchanged. IMPRESSION: Interval development of multifocal airspace process over the right lung and left midlung likely multifocal pneumonia. Electronically Signed   By: Elberta Fortis M.D.   On: 01-03-21 08:49   ECHOCARDIOGRAM COMPLETE  Result Date: 01/03/2021    ECHOCARDIOGRAM REPORT   Patient Name:   RIANNE DEGRAAF Date of Exam: Jan 03, 2021 Medical Rec #:  [id number]          Height:       64.0 in Accession #:    [id number]         Weight:       70.0 lb Date of Birth:  September 22, 1990          BSA:          1.252 m Patient Age:    30 years           BP:           107/61 mmHg Patient Gender: F                  HR:           121 bpm. Exam Location:  Inpatient Procedure: 2D Echo, Cardiac Doppler and Color Doppler Indications:    I50.21 Acute systolic (congestive) heart failure  History:        Patient has no prior history of Echocardiogram examinations.                 Signs/Symptoms:Shortness of Breath. Malnutrition.  Sonographer:    Roosvelt Maser RDCS Referring Phys: [id number] [facility] IMPRESSIONS  1. Left ventricular ejection fraction, by estimation, is 40 to 45%. The left ventricle has mildly decreased function. The left ventricle demonstrates global hypokinesis. Left ventricular diastolic parameters are consistent with Grade I diastolic dysfunction (impaired relaxation).  2. Right ventricular systolic function is normal. The right ventricular size is normal. Tricuspid regurgitation signal is inadequate for assessing PA pressure.  3. A small pericardial effusion is present. The pericardial effusion is anterior to the right ventricle and surrounding the apex. There is no evidence of cardiac tamponade.  4. The mitral valve is normal in structure. No evidence of mitral valve regurgitation.  5. The aortic valve was not well visualized. Aortic valve regurgitation is not visualized. No aortic stenosis is present.  6. The inferior vena cava is normal in size with greater than 50% respiratory variability, suggesting right atrial pressure of 3 mmHg. Comparison(s): Technically difficult study limited by tachycardia and constant motion. Consider repeat study when the patient has a slower heart rate and is able to cooperate with imaging. FINDINGS  Left Ventricle: Left ventricular ejection fraction, by estimation, is 40 to 45%. The left ventricle has mildly decreased function. The left ventricle demonstrates global hypokinesis. The left ventricular internal cavity size was normal in size. There is  no left ventricular hypertrophy. Left ventricular diastolic parameters are consistent with Grade I diastolic dysfunction (impaired relaxation). Right Ventricle: The right ventricular size is normal. No increase in right ventricular wall thickness. Right ventricular systolic function is normal. Tricuspid regurgitation signal is inadequate for assessing PA pressure. Left Atrium: Left atrial size was normal in size. Right Atrium: Right atrial size was normal in size. Pericardium: A small pericardial  effusion is present. The pericardial effusion is anterior to the right ventricle and surrounding the apex. There is no evidence of cardiac tamponade. Mitral Valve: The mitral valve is normal in structure. No evidence of mitral valve regurgitation. Tricuspid Valve: The tricuspid valve is grossly normal. Tricuspid valve regurgitation is not demonstrated. Aortic Valve: The aortic valve was not well visualized. Aortic valve regurgitation is not visualized. No aortic stenosis is present. Aortic valve mean gradient measures 2.0 mmHg. Aortic valve peak gradient measures 4.2 mmHg. Aortic valve area, by VTI measures 3.00 cm. Pulmonic Valve: The pulmonic valve was not well visualized. Pulmonic valve regurgitation is not visualized. Aorta: The aortic root is normal in size and structure. Venous: The inferior vena cava is normal in size with greater than 50% respiratory variability, suggesting right atrial pressure of 3 mmHg. IAS/Shunts: No atrial level shunt detected by color flow Doppler.  LEFT VENTRICLE PLAX 2D LVIDd:         3.70 cm LVIDs:         2.80 cm LV PW:         1.00 cm LV IVS:        0.70 cm LVOT diam:     2.10 cm LV SV:         38 LV SV Index:   31 LVOT Area:     3.46 cm  RIGHT VENTRICLE RV Basal diam:  3.10 cm LEFT ATRIUM         Index LA diam:    2.10 cm 1.68 cm/m  AORTIC VALVE AV Area (Vmax):    2.99 cm AV Area (Vmean):   2.83 cm AV Area (VTI):     3.00 cm AV Vmax:           102.00 cm/s AV Vmean:          71.300 cm/s AV VTI:            0.128 m AV Peak Grad:      4.2 mmHg AV Mean Grad:      2.0 mmHg LVOT Vmax:         88.00 cm/s LVOT Vmean:        58.200 cm/s LVOT VTI:          0.111 m LVOT/AV VTI ratio: 0.87  AORTA Ao Root diam: 3.10 cm  SHUNTS Systemic VTI:  0.11 m Systemic Diam: 2.10 cm Rachelle Hora Croitoru MD Electronically signed by Thurmon Fair MD Signature Date/Time: 2020/12/29/3:08:18 PM    Final     Procedures .Critical Care  Date/Time: December 29, 2020 6:44 PM Performed by: Alvira Monday,  MD Authorized by: Alvira Monday, MD   Critical care provider statement:    Critical care time (minutes):  120   Critical care was time spent personally by me on the following activities:  Discussions with consultants, evaluation of patient's response to treatment, examination of patient, ordering and performing treatments and interventions, ordering and review of laboratory studies, ordering and review of radiographic studies, pulse oximetry, re-evaluation of patient's condition, obtaining history from patient or surrogate and review of old charts Procedure Name: Intubation  Date/Time: 2021-01-02 6:44 PM Performed by: Alvira Monday, MD Oxygen Delivery Method: Ambu bag Ventilation: Mask ventilation without difficulty Grade View: Grade I Tube size: 7.0 mm Number of attempts: 1 Placement Confirmation: ETT inserted through vocal cords under direct vision    CPR  Date/Time: 2021-01-02 6:55 PM Performed by: Alvira Monday, MD Authorized by: Alvira Monday, MD  CPR Procedure Details:    Outcome: ROSC obtained    CPR performed via ACLS guidelines under my direct supervision.  See RN documentation for details including defibrillator use, medications, doses and timing.   Medications Ordered in ED Medications  thiamine 500mg  in normal saline (50ml) IVPB (has no administration in time range)  enoxaparin (LOVENOX) injection 40 mg (40 mg Subcutaneous Given Jan 02, 2021 1704)  ondansetron (ZOFRAN) injection 4 mg (has no administration in time range)  pantoprazole (PROTONIX) EC tablet 40 mg (40 mg Oral Not Given 01-02-21 1636)  feeding supplement (VITAL HIGH PROTEIN) liquid 1,000 mL (has no administration in time range)  feeding supplement (PROSource TF) liquid 45 mL (has no administration in time range)  multivitamin liquid 15 mL (has no administration in time range)  potassium chloride (KLOR-CON) packet 40 mEq (40 mEq Oral Not Given Jan 02, 2021 1642)  piperacillin-tazobactam (ZOSYN) IVPB 3.375 g  (3.375 g Intravenous New Bag/Given 01-02-2021 1653)  vancomycin (VANCOCIN) 500 mg in sodium chloride 0.9 % 100 mL IVPB (has no administration in time range)  fentaNYL in NS (45mcg/ml) infusion-PREMIX (has no administration in time range)  fentaNYL (SUBLIMAZE) bolus via infusion 50-100 mcg (has no administration in time range)  docusate (COLACE) 50 MG/5ML liquid 100 mg (has no administration in time range)  polyethylene glycol (MIRALAX / GLYCOLAX) packet 17 g (has no administration in time range)  fentaNYL (SUBLIMAZE) injection 50 mcg (50 mcg Intravenous Given 01-02-21 1754)  fentaNYL (SUBLIMAZE) injection 50-200 mcg (has no administration in time range)  norepinephrine (LEVOPHED) 4mg  in premix infusion (33 mcg/min Intravenous New Bag/Given 2021/01/02 1801)  vasopressin (PITRESSIN) 20 Units in sodium chloride 0.9 % 100 mL infusion-*FOR SHOCK* (0.03 Units/min Intravenous New Bag/Given January 02, 2021 1712)  lactated ringers bolus 1,000 mL (0 mLs Intravenous Stopped 01/02/2021 0958)  cefTRIAXone (ROCEPHIN) 1 g in sodium chloride 0.9 % 100 mL IVPB (0 g Intravenous Stopped 2021/01/02 0954)  doxycycline (VIBRAMYCIN) 100 mg in sodium chloride 0.9 % 250 mL IVPB (0 mg Intravenous Stopped 01/02/2021 1206)  LORazepam (ATIVAN) tablet 0.5 mg (0.5 mg Oral Given 01/02/2021 1004)  lactated ringers bolus 1,000 mL (0 mLs Intravenous Stopped 01-02-2021 1442)  vancomycin (VANCOCIN) IVPB 1000 mg/200 mL premix (1,000 mg Intravenous New Bag/Given 2021-01-02 1725)  EPINEPHrine (ADRENALIN) 1 MG/10ML injection (1 mg Intravenous Given 01-02-21 1534)    ED Course  I have reviewed the triage vital signs and the nursing notes.  Pertinent labs & imaging results that were available during my care of the patient were reviewed by me and considered in my medical decision making (see chart for details).    MDM Rules/Calculators/A&P                           30 year old female with a history of dysautonomia, Ehlers-Danlos syndrome, mast  cell activation syndrome, history of myasthenia gravis (serum negative, recent normal single-fiber EMG study, with additional brief needle EMG, periodic paralysis test also normal 11/26/2020 at [facility]), POTS, PTSD, depression, fibromyalgia, severe protein calorie malnutrition with G-tube in place, on home oxygen who presents with concern for fatigue, dyspnea.  She arrives  with concern for allergic reaction with itching and dyspnea--timing seems less consistent with anaphylaxis given symptoms started last night, no objective findings on exam--ordered epi pen however prior to getting it she stopped the RN. It was later found that she thought the epi would exacerbate her myasthenia--however at this time she had other findings on XR and labs that suggest other etiology of her dyspnea and given troponin elevation and unclear benefit of epinephrine this was discontinued.  Discussed patient with Neurology given ?myasthenia on arrival who reviewed her recent records from [facility] and felt very reassured by recent normal single-fiber EMG that findings not consistent with myasthenia gravis and do not suspect this is an exacerbation.  CXR concerning for possible multifocal pneumonia. Due to her many allergies, ordered rocephin/doxycycline for treatment.  Blood cx ordered however patient declines second set of cultures. Initially ordered 1L of fluid followed by a second liter after lactic acid returned however unclear if presentation secondary to sepsis on arrival or with continued work up.  Troponin elevated and BNP also returned elevated.  Possible CHF, myocarditis, consider wet beri beri in setting of malnutrition, PE. DDimer elevated but anaphylaxis to contrast and not stable enough for VQ.  CT ordered of chest and abdomen WO contrast.   Called PCCM initially, however discussed not on pressors or indication for intubation and lactate trended down and consulted IM for admission, however her saturations decreased and she became  more ill appearing and ICU came to bedside to evaluate and admit.  As she was awaiting her ICU bed, she was seen growing bradycardic and had a cardiac arrest with asystole and CPR immediately initiated and she was intubated.  She had ROSC after  epinephrine.  ETT placement confirmed.    Final Clinical Impression(s) / ED Diagnoses Final diagnoses:  Severe protein-calorie malnutrition (HCC)  Elevated troponin  Multifocal pneumonia  Acute respiratory failure with hypoxia (HCC)  Congestive heart failure, unspecified HF chronicity, unspecified heart failure type (HCC)  Lactic acidosis  Cardiac arrest [facility])    Rx / DC Orders ED Discharge Orders     None        Alvira Monday, MD 12/30/2020 1910

## 2020-12-27 NOTE — Progress Notes (Addendum)
eLink Physician-Brief Progress Note Patient Name: Seng Fouts DOB: Jan 12, 1991 MRN: [id number]   Date of Service  January 21, 2021  HPI/Events of Note  Hgb 6.1. Has dropped from 10.9 this AM -> 8 -> 6.1  Platelets most recently were 23k.  There may be some amount of dilutional component to the drop -- her Hgb in June was 7.7 so her value of 10.9 this AM was likely hemo-concentrated. She has been getting IVF volume for her septic shock.  Alternatively, this may also be representative of DIC from her septic shock.   eICU Interventions  Ordered STAT repeat CBC + T&C.  Ordered 1u pRBC (emergency release).  Consider platelet transfusion depending on repeat CBC value.  Discontinued Lovenox (was on ppx only).   ADDENDUM .01/14/2021 12:42 AM  - CBC shows platelet count of 16k. - Given her thrombocytopenia, prolonged PT/INR, elevated D-Dimer, and clinical picture, I would argue that DIC is the most likely etiology. - DIC management: Monitor for signs of clotting/bleeding. Transfuse platelets if < 10k. Transfuse Hgb < 7.0. Address underlying cause (septic shock in this case).  Intervention Category Intermediate Interventions: Bleeding - evaluation and treatment with blood products  Marveen Reeks Yakelin Grenier 21-Jan-2021, 11:42 PM

## 2020-12-28 DIAGNOSIS — I469 Cardiac arrest, cause unspecified: Secondary | ICD-10-CM | POA: Diagnosis not present

## 2020-12-28 DIAGNOSIS — G9340 Encephalopathy, unspecified: Secondary | ICD-10-CM

## 2020-12-28 DIAGNOSIS — R0902 Hypoxemia: Secondary | ICD-10-CM | POA: Diagnosis not present

## 2020-12-28 LAB — BPAM PLATELET PHERESIS
Blood Product Expiration Date: 202207122359
Unit Type and Rh: 6200

## 2020-12-28 LAB — GLUCOSE, CAPILLARY: Glucose-Capillary: 127 mg/dL — ABNORMAL HIGH (ref 70–99)

## 2020-12-28 LAB — PREPARE PLATELET PHERESIS: Unit division: 0

## 2020-12-28 LAB — CBC WITH DIFFERENTIAL/PLATELET
Abs Immature Granulocytes: 0.03 10*3/uL (ref 0.00–0.07)
Basophils Absolute: 0 10*3/uL (ref 0.0–0.1)
Basophils Relative: 1 %
Eosinophils Absolute: 0 10*3/uL (ref 0.0–0.5)
Eosinophils Relative: 0 %
HCT: 26.1 % — ABNORMAL LOW (ref 36.0–46.0)
Hemoglobin: 7.7 g/dL — ABNORMAL LOW (ref 12.0–15.0)
Immature Granulocytes: 4 %
Lymphocytes Relative: 33 %
Lymphs Abs: 0.3 10*3/uL — ABNORMAL LOW (ref 0.7–4.0)
MCH: 27.8 pg (ref 26.0–34.0)
MCHC: 29.5 g/dL — ABNORMAL LOW (ref 30.0–36.0)
MCV: 94.2 fL (ref 80.0–100.0)
Monocytes Absolute: 0 10*3/uL — ABNORMAL LOW (ref 0.1–1.0)
Monocytes Relative: 5 %
Neutro Abs: 0.4 10*3/uL — CL (ref 1.7–7.7)
Neutrophils Relative %: 57 %
Platelets: 16 10*3/uL — CL (ref 150–400)
RBC: 2.77 MIL/uL — ABNORMAL LOW (ref 3.87–5.11)
RDW: 15.9 % — ABNORMAL HIGH (ref 11.5–15.5)
WBC: 0.8 10*3/uL — CL (ref 4.0–10.5)
nRBC: 0 % (ref 0.0–0.2)

## 2020-12-28 LAB — POCT I-STAT 7, (LYTES, BLD GAS, ICA,H+H)
Acid-Base Excess: 5 mmol/L — ABNORMAL HIGH (ref 0.0–2.0)
Bicarbonate: 33.9 mmol/L — ABNORMAL HIGH (ref 20.0–28.0)
Calcium, Ion: 0.96 mmol/L — ABNORMAL LOW (ref 1.15–1.40)
HCT: 19 % — ABNORMAL LOW (ref 36.0–46.0)
Hemoglobin: 6.5 g/dL — CL (ref 12.0–15.0)
O2 Saturation: 85 %
Patient temperature: 34.7
Potassium: 3.1 mmol/L — ABNORMAL LOW (ref 3.5–5.1)
Sodium: 149 mmol/L — ABNORMAL HIGH (ref 135–145)
TCO2: 37 mmol/L — ABNORMAL HIGH (ref 22–32)
pCO2 arterial: 86.8 mmHg (ref 32.0–48.0)
pH, Arterial: 7.187 — CL (ref 7.350–7.450)
pO2, Arterial: 57 mmHg — ABNORMAL LOW (ref 83.0–108.0)

## 2020-12-28 LAB — TYPE AND SCREEN
ABO/RH(D): A POS
Antibody Screen: NEGATIVE
Unit division: 0
Unit division: 0

## 2020-12-28 LAB — BPAM RBC
Blood Product Expiration Date: 202207152359
Blood Product Expiration Date: 202207272359
ISSUE DATE / TIME: 202207102343
Unit Type and Rh: 6200
Unit Type and Rh: 9500

## 2020-12-28 LAB — TROPONIN I (HIGH SENSITIVITY): Troponin I (High Sensitivity): 348 ng/L (ref <18)

## 2020-12-28 LAB — FIBRINOGEN: Fibrinogen: 284 mg/dL (ref 210–475)

## 2020-12-28 LAB — ABO/RH: ABO/RH(D): A POS

## 2020-12-28 MED ORDER — SODIUM BICARBONATE 8.4 % IV SOLN
INTRAVENOUS | Status: AC
  Administered 2020-12-28: 100 meq via INTRAVENOUS
  Filled 2020-12-28: qty 100

## 2020-12-28 MED ORDER — PRISMASOL BGK 4/2.5 32-4-2.5 MEQ/L REPLACEMENT SOLN
Status: DC
  Filled 2020-12-28 (×2): qty 5000

## 2020-12-28 MED ORDER — SODIUM BICARBONATE 8.4 % IV SOLN
100.0000 meq | INTRAVENOUS | Status: AC
  Administered 2020-12-28: 100 meq via INTRAVENOUS

## 2020-12-28 MED ORDER — STERILE WATER FOR INJECTION IV SOLN
INTRAVENOUS | Status: DC
  Filled 2020-12-28 (×3): qty 150

## 2020-12-28 MED ORDER — SODIUM BICARBONATE 8.4 % IV SOLN: 100.0000 meq | Freq: Once | INTRAVENOUS | Status: AC

## 2020-12-28 MED ORDER — SODIUM CHLORIDE 0.9 % FOR CRRT: INTRAVENOUS_CENTRAL | Status: DC | PRN

## 2020-12-28 MED ORDER — SODIUM CHLORIDE 0.9 % IV SOLN: 2.0000 g | Freq: Two times a day (BID) | INTRAVENOUS | Status: DC

## 2020-12-28 MED ORDER — SODIUM BICARBONATE 8.4 % IV SOLN
50.0000 meq | INTRAVENOUS | Status: AC
  Administered 2020-12-28: 50 meq via INTRAVENOUS

## 2020-12-28 MED ORDER — FLUCONAZOLE IN SODIUM CHLORIDE 400-0.9 MG/200ML-% IV SOLN
800.0000 mg | Freq: Once | INTRAVENOUS | Status: AC
  Administered 2020-12-28: 800 mg via INTRAVENOUS
  Filled 2020-12-28: qty 400

## 2020-12-28 MED ORDER — VANCOMYCIN HCL 500 MG IV SOLR: 500.0000 mg | INTRAVENOUS | Status: DC

## 2020-12-28 MED ORDER — PRISMASOL BGK 4/2.5 32-4-2.5 MEQ/L EC SOLN
Status: DC
  Filled 2020-12-28 (×5): qty 5000

## 2020-12-28 MED ORDER — SODIUM CHLORIDE 0.9% IV SOLUTION: Freq: Once | INTRAVENOUS | Status: DC

## 2020-12-28 MED ORDER — FLUCONAZOLE IN SODIUM CHLORIDE 400-0.9 MG/200ML-% IV SOLN: 400.0000 mg | INTRAVENOUS | Status: DC

## 2020-12-28 MED ORDER — SODIUM BICARBONATE 8.4 % IV SOLN
100.0000 meq | Freq: Once | INTRAVENOUS | Status: AC
  Administered 2020-12-28: 100 meq via INTRAVENOUS

## 2020-12-28 MED ORDER — SODIUM BICARBONATE 8.4 % IV SOLN
INTRAVENOUS | Status: DC
  Filled 2020-12-28 (×2): qty 1000

## 2020-12-28 MED ORDER — SODIUM BICARBONATE 8.4 % IV SOLN: 100.0000 meq | INTRAVENOUS | Status: AC

## 2020-12-30 LAB — URINE CULTURE: Culture: >=100000 — AB

## 2020-12-30 LAB — CULTURE, BLOOD (ROUTINE X 2): Special Requests: ADEQUATE

## 2020-12-31 LAB — CULTURE, BLOOD (ROUTINE X 2): Special Requests: ADEQUATE

## 2021-01-01 LAB — CBC WITH DIFFERENTIAL/PLATELET
Abs Immature Granulocytes: 0.03 10*3/uL (ref 0.00–0.07)
Basophils Absolute: 0 10*3/uL (ref 0.0–0.1)
Basophils Relative: 1 %
Eosinophils Absolute: 0 10*3/uL (ref 0.0–0.5)
Eosinophils Relative: 0 %
HCT: 33 % — ABNORMAL LOW (ref 36.0–46.0)
Hemoglobin: 10.3 g/dL — ABNORMAL LOW (ref 12.0–15.0)
Immature Granulocytes: 1 %
Lymphocytes Relative: 8 %
Lymphs Abs: 0.4 10*3/uL — ABNORMAL LOW (ref 0.7–4.0)
MCH: 27.6 pg (ref 26.0–34.0)
MCHC: 31.2 g/dL (ref 30.0–36.0)
MCV: 88.5 fL (ref 80.0–100.0)
Monocytes Absolute: 0.2 10*3/uL (ref 0.1–1.0)
Monocytes Relative: 4 %
Neutro Abs: 4 10*3/uL (ref 1.7–7.7)
Neutrophils Relative %: 86 %
Platelets: 60 10*3/uL — ABNORMAL LOW (ref 150–400)
RBC: 3.73 MIL/uL — ABNORMAL LOW (ref 3.87–5.11)
RDW: 15.8 % — ABNORMAL HIGH (ref 11.5–15.5)
WBC: 4.6 10*3/uL (ref 4.0–10.5)
nRBC: 0 % (ref 0.0–0.2)

## 2021-01-18 NOTE — Progress Notes (Signed)
Pharmacy Antibiotic Note  Brandy [name] is a 30 y.o. female admitted on 01-26-21 with pneumonia.  Pharmacy has been consulted for fluconazole dosing.  Plan: Fluconazole 800 mg IV x 1 then 400 mg IV q24 hours F/u renal function, cultures and clinical course  Height: 5\' 1"  (154.9 cm) Weight: 45.4 kg (100 lb) IBW/kg (Calculated) : 47.8  Temp (24hrs), Avg:96.4 F (35.8 C), Min:94.8 F (34.9 C), Max:98.1 F (36.7 C)  Recent Labs  Lab 01-26-2021 0818 2021/01/26 0826 Jan 26, 2021 1044 01/26/2021 2013 01-26-2021 2301  WBC 4.6  --   --  1.0* 0.8*  CREATININE 0.91 0.80  --   --   --   LATICACIDVEN 4.7*  --  3.7* >11.0*  --     Estimated Creatinine Clearance: 74.4 mL/min (by C-G formula based on SCr of 0.8 mg/dL).    Allergies  Allergen Reactions   Aspirin Other (See Comments)    Unknown reaction   Nsaids Other (See Comments)    Unknown reaction   Quinolones Other (See Comments)    Unknown reaction   Sulfa Antibiotics Other (See Comments)    Unknown reaction    Telithromycin Other (See Comments)    Unknown reaction    Fludrocortisone Other (See Comments)    Unknown reaction    Ambien [Zolpidem] Other (See Comments)    Unknown reaction   Amitriptyline Other (See Comments)    Unknown reaction    Beef-Potatoes-Spinach [Compleat] Other (See Comments)    Unknown reaction    Benadryl [Diphenhydramine] Swelling    Tongue swelling - reaction to tablets and liquid - tolerates injections and benadryl cream   Botulinum Toxins Other (See Comments)    Unknown reaction    Chlorhexidine Hives   Ciprofloxacin Other (See Comments)    Unknown reaction    Codeine Other (See Comments)    Unknown reaction    Contrast Media [Iodinated Diagnostic Agents] Other (See Comments)    Unknown reaction    Creon [Pancrelipase (Lip-Prot-Amyl)] Other (See Comments)    Unknown reaction    Cymbalta [Duloxetine Hcl] Other (See Comments)    Unknown reaction    Dextrans Other (See Comments)     Unknown reaction    Dilaudid [Hydromorphone] Other (See Comments)    Severe migraine, SVT, chest pain   Ergotamine Other (See Comments)    Unknown reaction    Fluoxetine Other (See Comments)    Unknown reaction - reaction to All SSRIs    Gabapentin Other (See Comments)    Unknown reaction    Gallamine Other (See Comments)    Unknown reaction    Hydrocodone Other (See Comments)    Unknown reaction    Iodine Other (See Comments)    Unknown reaction    Lactose Intolerance (Gi) Other (See Comments)    Unknown reaction    Levofloxacin Other (See Comments)    Unknown reaction    Lunesta [Eszopiclone] Other (See Comments)    Unknown reaction    Macrolides And Ketolides Other (See Comments)    Unknown reaction    Maprotiline Other (See Comments)    Unknown reaction    Marplan [Isocarboxazid] Other (See Comments)    Any MAOI contraindicated - unknown reaction   Midodrine Other (See Comments)    Unknown reaction    Morphine And Related Other (See Comments)    Severe migraine, SVT, chest pain, throat starts to close   Nitrous Oxide Other (See Comments)    Unknown reaction    Oxycodone Other (See  Comments)    Unknown reaction    Pethidine [Meperidine] Other (See Comments)    Unknown reaction    Phenergan [Promethazine] Other (See Comments)    Unknown reaction    Phenobarbital Other (See Comments)    Unknown reaction    Pyridium [Phenazopyridine] Other (See Comments)    Unknown reaction    Reglan [Metoclopramide] Other (See Comments)    Unknown reaction    Remeron [Mirtazapine] Other (See Comments)    Noradrenergic antagonist   Salicylates Other (See Comments)    Unknown reaction    Savella [Milnacipran] Other (See Comments)    Unknown reaction    Scopolamine Other (See Comments)    Unknown reaction    Sumatriptan Other (See Comments)    Unknown reaction    Tape Other (See Comments)    Unknown reaction    Topiramate Other (See Comments)    Unknown  reaction    Tricyclic Antidepressants Other (See Comments)    Unknown reaction    Tums [Calcium Carbonate] Nausea And Vomiting, Swelling and Other (See Comments)    Tongue swelling, migraine, heartburn   Vancomycin Other (See Comments)    Unknown reaction    Verapamil Other (See Comments)    Unknown reaction      Thank you for allowing pharmacy to be a part of this patient's care.  Talbert Cage Poteet 01/12/2021 1:03 AM

## 2021-01-18 NOTE — Progress Notes (Signed)
   01-05-2021 2253  Clinical Encounter Type  Visited With Patient and family together  Visit Type Initial;Critical Care  Referral From Nurse  Consult/Referral To Chaplain   Chaplain responded to page for family support while Pt's nurse, Learta Codding, and team, work to stabilize Pt. Pt's foster mom, her husband, and Pt's friend were present in the unit's consult room. Chaplain engaged active listening and provided emotional support. Chaplain provided ministry of presence while Pt's friend and Pt's foster mom were at bedside. While walking Pt's friend, Elmarie Shiley, out of the hospital, she expressed concern about Pt's ability to take care of herself and what will happen to Pt's son. Pt's friend said that in the past, the last thing the Pt had wanted was her foster mom being involved in her care while also saying she couldn't be sure what Pt would say now. When Chaplain returned to Pt's foster mom at bedside, she said she was doing okay and chaplain shared how to reach out if she wanted someone with her. Chaplain remains available.   This note was prepared by Chaplain Resident, Tacy Learn, MDiv. Chaplain remains available as needed through the on-call pager: [phone].

## 2021-01-18 NOTE — Procedures (Signed)
Central Venous Catheter Insertion Procedure Note  Brandy [name]  [id number]  08/14/1990  Date:12/21/2020  Time:8:07 AM   Provider Performing:Lashawna Poche Marcos Eke   Procedure: Insertion of Non-tunneled Central Venous [phone]) with US guidance ([id number])   Indication(s) Medication administration  Consent Risks of the procedure as well as the alternatives and risks of each were explained to the patient and/or caregiver.  Consent for the procedure was obtained and is signed in the bedside chart  Anesthesia Topical only with 1% lidocaine   Timeout Verified patient identification, verified procedure, site/side was marked, verified correct patient position, special equipment/implants available, medications/allergies/relevant history reviewed, required imaging and test results available.  Sterile Technique Maximal sterile technique including full sterile barrier drape, hand hygiene, sterile gown, sterile gloves, mask, hair covering, sterile ultrasound probe cover (if used).  Procedure Description Area of catheter insertion was cleaned with chlorhexidine and draped in sterile fashion.  With real-time ultrasound guidance a central venous catheter was placed into the left internal jugular vein. Nonpulsatile blood flow and easy flushing noted in all ports.  The catheter was sutured in place and sterile dressing applied.  Complications/Tolerance None; patient tolerated the procedure well. Chest X-ray is ordered to verify placement for internal jugular or subclavian cannulation.   Chest x-ray is not ordered for femoral cannulation.  EBL Minimal  Specimen(s) None

## 2021-01-18 NOTE — Progress Notes (Signed)
LTM was disconnected from deceased patient before tech could arrive. Machine was in the hallway.

## 2021-01-18 NOTE — Procedures (Signed)
Central Venous Catheter Insertion Procedure Note  Brandy [name]  [id number]  09-09-1990  Date:01/01/2021  Time:8:11 AM   Provider Performing:Anneth Brunell Marcos Eke   Procedure: Insertion of Non-tunneled Central Venous Catheter(36556)with US guidance (82505)    Indication(s) hemodialysis  Consent Risks of the procedure as well as the alternatives and risks of each were explained to the patient and/or caregiver.  Consent for the procedure was obtained and is signed in the bedside chart  Anesthesia Topical only with 1% lidocaine   Timeout Verified patient identification, verified procedure, site/side was marked, verified correct patient position, special equipment/implants available, medications/allergies/relevant history reviewed, required imaging and test results available.  Sterile Technique Maximal sterile technique including full sterile barrier drape, hand hygiene, sterile gown, sterile gloves, mask, hair covering, sterile ultrasound probe cover (if used).  Procedure Description Area of catheter insertion was cleaned with chlorhexidine and draped in sterile fashion.   With real-time ultrasound guidance a HD catheter was placed into the right femoral vein.  Nonpulsatile blood flow and easy flushing noted in all ports.  The catheter was sutured in place and sterile dressing applied.  Complications/Tolerance None; patient tolerated the procedure well. Chest X-ray is ordered to verify placement for internal jugular or subclavian cannulation.  Chest x-ray is not ordered for femoral cannulation.  EBL Minimal  Specimen(s) None

## 2021-01-18 NOTE — Consult Note (Signed)
Reason for Consult: Profound metabolic acidosis Referring Physician: Charlotte Sanes, MD (CCM)  HPI:  30 year old woman with a complicated past medical history of seronegative myasthenia gravis, severe protein calorie malnutrition, failure to thrive/muscle wasting, Ehler Danlos syndrome with cervical instability, mast cell activation syndrome and multiple medication allergies.  She was presented to the emergency room yesterday with increasing confusion/metabolic acidosis that appears to have been from cavitary right upper lobe pneumonia with worsening mentation/bradycardia during the course of the day with subsequent PEA arrest.  Overnight, she was noted to have worsening markers of sepsis along with thrombocytopenia/anemia indicative of DIC and in addition to being anuric, she has mixed metabolic and respiratory acidosis not responding adequately to maximize medical management.  Past Medical History:  Diagnosis Date   Dysautonomia (HCC) 06/28/2020   Ehlers-Danlos syndrome 06/28/2020   Mast cell activation syndrome (HCC) 06/28/2020   Myasthenia gravis (HCC)    POTS (postural orthostatic tachycardia syndrome) 06/28/2020   Severe protein-calorie malnutrition (HCC) 05/20/2020    Past Surgical History:  Procedure Laterality Date   IR FLUORO GUIDE CV LINE LEFT  11/03/2020   IR GJ TUBE CHANGE  07/11/2020   IR REMOVAL TUN CV CATH W/O FL  06/03/2020   IR US GUIDE VASC ACCESS LEFT  11/03/2020    Family History  Adopted: Yes    Social History:  reports that she has quit smoking. She has never used smokeless tobacco. No history on file for alcohol use and drug use.  Allergies:  Allergies  Allergen Reactions   Aspirin Other (See Comments)    Unknown reaction   Nsaids Other (See Comments)    Unknown reaction   Quinolones Other (See Comments)    Unknown reaction   Sulfa Antibiotics Other (See Comments)    Unknown reaction    Telithromycin Other (See Comments)    Unknown reaction     Fludrocortisone Other (See Comments)    Unknown reaction    Ambien [Zolpidem] Other (See Comments)    Unknown reaction   Amitriptyline Other (See Comments)    Unknown reaction    Beef-Potatoes-Spinach [Compleat] Other (See Comments)    Unknown reaction    Benadryl [Diphenhydramine] Swelling    Tongue swelling - reaction to tablets and liquid - tolerates injections and benadryl cream   Botulinum Toxins Other (See Comments)    Unknown reaction    Chlorhexidine Hives   Ciprofloxacin Other (See Comments)    Unknown reaction    Codeine Other (See Comments)    Unknown reaction    Contrast Media [Iodinated Diagnostic Agents] Other (See Comments)    Unknown reaction    Creon [Pancrelipase (Lip-Prot-Amyl)] Other (See Comments)    Unknown reaction    Cymbalta [Duloxetine Hcl] Other (See Comments)    Unknown reaction    Dextrans Other (See Comments)    Unknown reaction    Dilaudid [Hydromorphone] Other (See Comments)    Severe migraine, SVT, chest pain   Ergotamine Other (See Comments)    Unknown reaction    Fluoxetine Other (See Comments)    Unknown reaction - reaction to All SSRIs    Gabapentin Other (See Comments)    Unknown reaction    Gallamine Other (See Comments)    Unknown reaction    Hydrocodone Other (See Comments)    Unknown reaction    Iodine Other (See Comments)    Unknown reaction    Lactose Intolerance (Gi) Other (See Comments)    Unknown reaction    Levofloxacin Other (See Comments)  Unknown reaction    Lunesta [Eszopiclone] Other (See Comments)    Unknown reaction    Macrolides And Ketolides Other (See Comments)    Unknown reaction    Maprotiline Other (See Comments)    Unknown reaction    Marplan [Isocarboxazid] Other (See Comments)    Any MAOI contraindicated - unknown reaction   Midodrine Other (See Comments)    Unknown reaction    Morphine And Related Other (See Comments)    Severe migraine, SVT, chest pain, throat starts to  close   Nitrous Oxide Other (See Comments)    Unknown reaction    Oxycodone Other (See Comments)    Unknown reaction    Pethidine [Meperidine] Other (See Comments)    Unknown reaction    Phenergan [Promethazine] Other (See Comments)    Unknown reaction    Phenobarbital Other (See Comments)    Unknown reaction    Pyridium [Phenazopyridine] Other (See Comments)    Unknown reaction    Reglan [Metoclopramide] Other (See Comments)    Unknown reaction    Remeron [Mirtazapine] Other (See Comments)    Noradrenergic antagonist   Salicylates Other (See Comments)    Unknown reaction    Savella [Milnacipran] Other (See Comments)    Unknown reaction    Scopolamine Other (See Comments)    Unknown reaction    Sumatriptan Other (See Comments)    Unknown reaction    Tape Other (See Comments)    Unknown reaction    Topiramate Other (See Comments)    Unknown reaction    Tricyclic Antidepressants Other (See Comments)    Unknown reaction    Tums [Calcium Carbonate] Nausea And Vomiting, Swelling and Other (See Comments)    Tongue swelling, migraine, heartburn   Vancomycin Other (See Comments)    Unknown reaction    Verapamil Other (See Comments)    Unknown reaction     Medications: Scheduled:  sodium chloride   Intravenous Once   docusate  100 mg Per Tube BID   feeding supplement (PROSource TF)  45 mL Per Tube BID   feeding supplement (VITAL HIGH PROTEIN)  1,000 mL Per Tube Q24H   hydrocortisone sod succinate (SOLU-CORTEF) inj  50 mg Intravenous Q6H   multivitamin  15 mL Per Tube Daily   pantoprazole  40 mg Oral Daily   polyethylene glycol  17 g Per Tube Daily   potassium chloride  40 mEq Oral BID   sodium bicarbonate  100 mEq Intravenous Once    BMP Latest Ref Rng & Units 01-04-21 12/24/2020 01/14/2021  Glucose 70 - 99 mg/dL - - -  BUN 6 - 20 mg/dL - - -  Creatinine 5.78 - 1.00 mg/dL - - -  Sodium 469 - 629 mmol/L 149(H) 140 134(L)  Potassium 3.5 - 5.1 mmol/L 3.1(L)  2.6(LL) 3.2(L)  Chloride 98 - 111 mmol/L - - -  CO2 22 - 32 mmol/L - - -  Calcium 8.9 - 10.3 mg/dL - - -   CBC Latest Ref Rng & Units 01-04-2021 01/12/2021 01/09/2021  WBC 4.0 - 10.5 K/uL - 0.8(LL) -  Hemoglobin 12.0 - 15.0 g/dL 6.5(LL) 7.7(L) 6.1(LL)  Hematocrit 36.0 - 46.0 % 19.0(L) 26.1(L) 18.0(L)  Platelets 150 - 400 K/uL - 16(LL) -   Urinalysis    Component Value Date/Time   COLORURINE AMBER (A) 01/01/2021 0807   APPEARANCEUR HAZY (A) 01/13/2021 0807   LABSPEC 1.015 12/31/2020 0807   PHURINE 6.0 12/25/2020 0807   GLUCOSEU NEGATIVE 12/21/2020 5284  HGBUR NEGATIVE 2021-01-08 0807   BILIRUBINUR NEGATIVE 2021-01-08 0807   KETONESUR 20 (A) 08-Jan-2021 0807   PROTEINUR 30 (A) 01-08-21 0807   NITRITE NEGATIVE 2021/01/08 0807   LEUKOCYTESUR MODERATE (A) 2021/01/08 0807     CT ABDOMEN PELVIS WO CONTRAST  Result Date: 2021/01/08 CLINICAL DATA:  Chest pain. Clinical concern for pleurisy or effusion. Acute, non localized abdominal pain. Lung infiltrates on a portable chest radiograph earlier today. Ehlers-Danlos syndrome. Mast cell activation syndrome. History of myasthenia gravis. EXAM: CT CHEST, ABDOMEN AND PELVIS WITHOUT CONTRAST TECHNIQUE: Multidetector CT imaging of the chest, abdomen and pelvis was performed following the standard protocol without IV contrast. COMPARISON:  Portable chest obtained earlier today. Abdomen and pelvis CT dated 07/09/2020. FINDINGS: CT CHEST FINDINGS Cardiovascular: Diffuse low density of the blood relative to the arterial walls. Normal sized heart. Right jugular catheter tip at the superior cavoatrial junction. Mediastinum/Nodes: No enlarged mediastinal, hilar, or axillary lymph nodes. Thyroid gland, trachea, and esophagus demonstrate no significant findings. Lungs/Pleura: Extensive patchy/nodular opacities throughout both lungs, greater on the right. Areas of dense consolidation with air bronchograms and cavitation right upper lobe. Small right pleural  effusion. Musculoskeletal: Unremarkable bones. CT ABDOMEN PELVIS FINDINGS Hepatobiliary: No focal liver abnormality is seen. Status post cholecystectomy. No biliary dilatation. Pancreas: Unremarkable. No pancreatic ductal dilatation or surrounding inflammatory changes. Spleen: Normal in size without focal abnormality. Adrenals/Urinary Tract: Stable small mid right renal calculus measuring 4 mm on image 64/3. Unremarkable left kidney, ureters, urinary bladder and adrenal glands. Stomach/Bowel: Percutaneous gastrojejunal tube with its tip in the jejunum in the upper right pelvis. No bowel dilatation. No bowel or gastric dilatation. Normal appearing appendix. Vascular/Lymphatic: No significant vascular findings are present. No enlarged abdominal or pelvic lymph nodes. Reproductive: Uterus and bilateral adnexa are unremarkable. Other: Interval marked cachexia. Musculoskeletal: The hands are included with arachnodactyly demonstrated, compatible with the history of Ehlers-Danlos syndrome. No acute bony abnormality. IMPRESSION: 1. Extensive patchy/nodular opacities throughout both lungs, greater on the right with areas of dense consolidation and cavitation in the right upper lobe. These are compatible with extensive changes due to atypical infection with right upper lobe cavitary pneumonia. 2. Stable small, nonobstructing right renal calculus. 3. Diffuse low density of the blood relative to the arterial walls. This can be seen with anemia. Electronically Signed   By: Beckie Salts M.D.   On: 2021/01/08 14:20   CT Head Wo Contrast  Result Date: 01/08/21 CLINICAL DATA:  Mental status change. EXAM: CT HEAD WITHOUT CONTRAST TECHNIQUE: Contiguous axial images were obtained from the base of the skull through the vertex without intravenous contrast. COMPARISON:  None. FINDINGS: Scan positioning is suboptimal with mild motion artifact over the posterior fossa. Brain: Ventricles, cisterns and other CSF spaces are within  normal. There is no mass, mass effect, shift of midline structures or acute hemorrhage. Vascular: No hyperdense vessel or unexpected calcification. Skull: Normal. Negative for fracture or focal lesion. Sinuses/Orbits: Orbits are normal. Mild opacification over the mastoid air cells. Paranasal sinuses are clear. Other: None. IMPRESSION: 1. No acute findings. 2. Mild opacification over the mastoid air cells. Electronically Signed   By: Elberta Fortis M.D.   On: 01-08-21 16:31   CT Chest Wo Contrast  Result Date: 01/08/21 CLINICAL DATA:  Chest pain. Clinical concern for pleurisy or effusion. Acute, non localized abdominal pain. Lung infiltrates on a portable chest radiograph earlier today. Ehlers-Danlos syndrome. Mast cell activation syndrome. History of myasthenia gravis. EXAM: CT CHEST, ABDOMEN AND PELVIS WITHOUT CONTRAST TECHNIQUE:  Multidetector CT imaging of the chest, abdomen and pelvis was performed following the standard protocol without IV contrast. COMPARISON:  Portable chest obtained earlier today. Abdomen and pelvis CT dated 07/09/2020. FINDINGS: CT CHEST FINDINGS Cardiovascular: Diffuse low density of the blood relative to the arterial walls. Normal sized heart. Right jugular catheter tip at the superior cavoatrial junction. Mediastinum/Nodes: No enlarged mediastinal, hilar, or axillary lymph nodes. Thyroid gland, trachea, and esophagus demonstrate no significant findings. Lungs/Pleura: Extensive patchy/nodular opacities throughout both lungs, greater on the right. Areas of dense consolidation with air bronchograms and cavitation right upper lobe. Small right pleural effusion. Musculoskeletal: Unremarkable bones. CT ABDOMEN PELVIS FINDINGS Hepatobiliary: No focal liver abnormality is seen. Status post cholecystectomy. No biliary dilatation. Pancreas: Unremarkable. No pancreatic ductal dilatation or surrounding inflammatory changes. Spleen: Normal in size without focal abnormality. Adrenals/Urinary  Tract: Stable small mid right renal calculus measuring 4 mm on image 64/3. Unremarkable left kidney, ureters, urinary bladder and adrenal glands. Stomach/Bowel: Percutaneous gastrojejunal tube with its tip in the jejunum in the upper right pelvis. No bowel dilatation. No bowel or gastric dilatation. Normal appearing appendix. Vascular/Lymphatic: No significant vascular findings are present. No enlarged abdominal or pelvic lymph nodes. Reproductive: Uterus and bilateral adnexa are unremarkable. Other: Interval marked cachexia. Musculoskeletal: The hands are included with arachnodactyly demonstrated, compatible with the history of Ehlers-Danlos syndrome. No acute bony abnormality. IMPRESSION: 1. Extensive patchy/nodular opacities throughout both lungs, greater on the right with areas of dense consolidation and cavitation in the right upper lobe. These are compatible with extensive changes due to atypical infection with right upper lobe cavitary pneumonia. 2. Stable small, nonobstructing right renal calculus. 3. Diffuse low density of the blood relative to the arterial walls. This can be seen with anemia. Electronically Signed   By: Beckie Salts M.D.   On: 01/05/2021 14:20   DG CHEST PORT 1 VIEW  Result Date: 12/20/2020 CLINICAL DATA:  Check central line placement, history of bilateral pneumonia EXAM: PORTABLE CHEST 1 VIEW COMPARISON:  Film from earlier in the same day. FINDINGS: Cardiac shadow is stable. Endotracheal tube, right chest wall port and gastric catheter are seen in satisfactory position. New left jugular central line is noted in the distal superior vena cava. No pneumothorax is noted. Bilateral airspace opacities are again identified stable from the previous exam consistent with multifocal pneumonia. IMPRESSION: Tubes and lines in satisfactory position.  No pneumothorax is noted. Multifocal pneumonia bilaterally. Electronically Signed   By: Alcide Clever M.D.   On: 01/13/2021 23:46   DG Chest Portable  1 View  Result Date: 12/30/2020 CLINICAL DATA:  Status post intubation. EXAM: PORTABLE CHEST 1 VIEW COMPARISON:  Earlier today. FINDINGS: Interval endotracheal tube in satisfactory position. Interval nasogastric tube with its tip in the midthoracic esophagus. Stable right jugular porta catheter. Extensive bilateral airspace opacity, remaining most confluent in the right upper lobe. This is mildly progressive on the left. Associated obscuration of right heart borders. Grossly normal sized heart. Unremarkable bones. Cholecystectomy clips. IMPRESSION: 1. Endotracheal tube in satisfactory position. 2. Nasogastric tube tip in the mid esophagus. It is recommended that this be advanced at least 25 cm. 3. Extensive bilateral pneumonia, unchanged on the right and increased on the left. Electronically Signed   By: Beckie Salts M.D.   On: 01/15/2021 16:04   DG Chest Portable 1 View  Result Date: 01/08/2021 CLINICAL DATA:  Tachycardia and generalized weakness. EXAM: PORTABLE CHEST 1 VIEW COMPARISON:  11/20/2020 FINDINGS: Right IJ Port-A-Cath unchanged. Interval development of multifocal  airspace consolidation over the right lung worse in the right upper lobe. There is patchy hazy airspace opacification over the left midlung. No effusion. Cardiomediastinal silhouette and remainder of the exam is unchanged. IMPRESSION: Interval development of multifocal airspace process over the right lung and left midlung likely multifocal pneumonia. Electronically Signed   By: Elberta Fortis M.D.   On: 01-Jan-2021 08:49   ECHOCARDIOGRAM COMPLETE  Result Date: 2021/01/01    ECHOCARDIOGRAM REPORT   Patient Name:   Brandy [name] Date of Exam: 01-01-2021 Medical Rec #:  [id number]          Height:       64.0 in Accession #:    [id number]         Weight:       70.0 lb Date of Birth:  1990/09/21          BSA:          1.252 m Patient Age:    30 years           BP:           107/61 mmHg Patient Gender: F                  HR:           121  bpm. Exam Location:  Inpatient Procedure: 2D Echo, Cardiac Doppler and Color Doppler Indications:    I50.21 Acute systolic (congestive) heart failure  History:        Patient has no prior history of Echocardiogram examinations.                 Signs/Symptoms:Shortness of Breath. Malnutrition.  Sonographer:    Roosvelt Maser RDCS Referring Phys: [id number] [facility] [place] IMPRESSIONS  1. Left ventricular ejection fraction, by estimation, is 40 to 45%. The left ventricle has mildly decreased function. The left ventricle demonstrates global hypokinesis. Left ventricular diastolic parameters are consistent with Grade I diastolic dysfunction (impaired relaxation).  2. Right ventricular systolic function is normal. The right ventricular size is normal. Tricuspid regurgitation signal is inadequate for assessing PA pressure.  3. A small pericardial effusion is present. The pericardial effusion is anterior to the right ventricle and surrounding the apex. There is no evidence of cardiac tamponade.  4. The mitral valve is normal in structure. No evidence of mitral valve regurgitation.  5. The aortic valve was not well visualized. Aortic valve regurgitation is not visualized. No aortic stenosis is present.  6. The inferior vena cava is normal in size with greater than 50% respiratory variability, suggesting right atrial pressure of 3 mmHg. Comparison(s): Technically difficult study limited by tachycardia and constant motion. Consider repeat study when the patient has a slower heart rate and is able to cooperate with imaging. FINDINGS  Left Ventricle: Left ventricular ejection fraction, by estimation, is 40 to 45%. The left ventricle has mildly decreased function. The left ventricle demonstrates global hypokinesis. The left ventricular internal cavity size was normal in size. There is  no left ventricular hypertrophy. Left ventricular diastolic parameters are consistent with Grade I diastolic dysfunction (impaired relaxation). Right  Ventricle: The right ventricular size is normal. No increase in right ventricular wall thickness. Right ventricular systolic function is normal. Tricuspid regurgitation signal is inadequate for assessing PA pressure. Left [facility]: Left atrial size was normal in size. Right [facility]: Right atrial size was normal in size. Pericardium: A small pericardial effusion is present. The pericardial effusion is anterior to the right ventricle and surrounding the apex.  There is no evidence of cardiac tamponade. Mitral Valve: The mitral valve is normal in structure. No evidence of mitral valve regurgitation. Tricuspid Valve: The tricuspid valve is grossly normal. Tricuspid valve regurgitation is not demonstrated. Aortic Valve: The aortic valve was not well visualized. Aortic valve regurgitation is not visualized. No aortic stenosis is present. Aortic valve mean gradient measures 2.0 mmHg. Aortic valve peak gradient measures 4.2 mmHg. Aortic valve area, by VTI measures 3.00 cm. Pulmonic Valve: The pulmonic valve was not well visualized. Pulmonic valve regurgitation is not visualized. Aorta: The aortic root is normal in size and structure. Venous: The inferior vena cava is normal in size with greater than 50% respiratory variability, suggesting right atrial pressure of 3 mmHg. IAS/Shunts: No atrial level shunt detected by color flow Doppler.  LEFT VENTRICLE PLAX 2D LVIDd:         3.70 cm LVIDs:         2.80 cm LV PW:         1.00 cm LV IVS:        0.70 cm LVOT diam:     2.10 cm LV SV:         38 LV SV Index:   31 LVOT Area:     3.46 cm  RIGHT VENTRICLE RV Basal diam:  3.10 cm LEFT ATRIUM         Index LA diam:    2.10 cm 1.68 cm/m  AORTIC VALVE AV Area (Vmax):    2.99 cm AV Area (Vmean):   2.83 cm AV Area (VTI):     3.00 cm AV Vmax:           102.00 cm/s AV Vmean:          71.300 cm/s AV VTI:            0.128 m AV Peak Grad:      4.2 mmHg AV Mean Grad:      2.0 mmHg LVOT Vmax:         88.00 cm/s LVOT Vmean:        58.200 cm/s  LVOT VTI:          0.111 m LVOT/AV VTI ratio: 0.87  AORTA Ao Root diam: 3.10 cm  SHUNTS Systemic VTI:  0.11 m Systemic Diam: 2.10 cm Mihai Croitoru MD Electronically signed by Thurmon FairMihai Croitoru MD Signature Date/Time: 12/24/2020/3:08:18 PM    Final     Review of Systems  Unable to perform ROS: Intubated  Blood pressure (!) 114/94, pulse (!) 122, temperature (!) 94.8 F (34.9 C), temperature source Bladder, resp. rate (!) 28, height 5\' 1"  (1.549 m), weight 45.4 kg, SpO2 92 %. Physical Exam Vitals and nursing note reviewed.  Constitutional:      Appearance: She is ill-appearing and toxic-appearing.     Comments: Underweight  HENT:     Head: Normocephalic and atraumatic.     Right Ear: External ear normal.     Left Ear: External ear normal.     Nose: Nose normal.  Eyes:     Pupils: Pupils are equal, round, and reactive to light.  Cardiovascular:     Rate and Rhythm: Regular rhythm. Tachycardia present.     Heart sounds: Normal heart sounds. No murmur heard. Pulmonary:     Effort: Pulmonary effort is normal.     Breath sounds: Normal breath sounds.     Comments: Right upper chest subcutaneous port Abdominal:     General: Abdomen is flat. There is no distension.  Palpations: Abdomen is soft.     Tenderness: There is no abdominal tenderness.  Musculoskeletal:     Cervical back: Normal range of motion and neck supple.     Right lower leg: No edema.     Left lower leg: No edema.  Skin:    General: Skin is warm and dry.     Coloration: Skin is pale.    Assessment/Plan: 1.  Anuric acute kidney injury: Unfortunately, refused labs earlier while in the emergency room and limited information available regarding renal function however noted to have minimal to no urine output overnight even after fluid loading.  Will undertake CRRT. 2.  Mixed respiratory/anion gap metabolic acidosis: With difficulty to correct with medical management and will begin CRRT. 3.  Cavitary pneumonia with sepsis: On  cefepime/vancomycin 4.  Sepsis/DIC: Continue supportive management with blood products as indicated and treatment of underlying infection per CCM  Kasson Lamere K. 01/12/2021, 1:27 AM

## 2021-01-18 NOTE — Progress Notes (Signed)
Assumed care around 0215 to initiate CRRT following HD line placement.. Report received from [place], [place].

## 2021-01-18 NOTE — Progress Notes (Signed)
LTM EEG hooked up and running - no initial skin breakdown - push button tested - neuro notified. [facility] monitoring.  

## 2021-01-18 NOTE — Procedures (Signed)
Patient Name: Laylani Pudwill  MRN: [id number]  Epilepsy Attending: Charlsie Quest  Referring Physician/Provider: Janeann Forehand, NP Date: 2021/01/10 Duration: 24.35 mins  Patient history: 30 year old female admitted with multifocal pneumonia brief PEA arrest like to help with CPR.  EEG to evaluate for seizures.  Level of alertness: Comatose  AEDs during EEG study: None  Technical aspects: This EEG study was done with scalp electrodes positioned according to the 10-20 International system of electrode placement. Electrical activity was acquired at a sampling rate of 500Hz  and reviewed with a high frequency filter of 70Hz  and a low frequency filter of 1Hz . EEG data were recorded continuously and digitally stored.   Description: EEG showed continuous generalized background attenuation.  EEG was not reactive to tactile stimulation. Hyperventilation and photic stimulation were not performed.     ABNORMALITY -Background attenuation, generalized  IMPRESSION: This study suggestive of profound diffuse encephalopathy, nonspecific to etiology.  No seizures or epileptiform discharges were seen throughout the recording.  Declyn Offield 

## 2021-01-18 NOTE — Progress Notes (Signed)
ABG collected. Contacted Elink with results.

## 2021-01-18 NOTE — Death Summary Note (Signed)
DEATH SUMMARY   Patient Details  Name: Brandy MccreedySabrina Marie [name] MRN: [id number] DOB: 1991-03-24  Admission/Discharge Information   Admit Date:  01/24/21  Date of Death: Date of Death: 01/05/2021  Time of Death: Time of Death: 0333  Length of Stay: 1  Referring Physician: Lamont DowdyGill, Katherine, MD   Reason(s) for Hospitalization  Community acquired pneumonia  Diagnoses  Preliminary cause of death:   acute hypoxic and hypercarbic respiratory failure Secondary Diagnoses (including complications and co-morbidities):  Active Problems:   Hypoxia Septic shock  Community acquired pneumonia Cavitary pneumonia  Brief Hospital Course (including significant findings, care, treatment, and services provided and events leading to death)  Martie LeeSabrina Barbaraann FasterMarie Faraci is a 30 y.o. 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  MG recently ruled out by neuro work up, admitted to hospital via ER for failure to thrive.  She was found to have cavitary pneumonia and placed on supplemental oxygen, started on antibiotics.  She developed PEA cardiac arrest in ED.  Post rosc, there was evidence of anoxic brain injury.   She developed worsening hypercarbic hypoxemic respiratory failure and severe septic shock. Pneumonia and ARDS.   Anemia but no evidence of hemorrhage.  Transfused PRBC.    Discussed care as noted with her friend listed in chart as primary contact, as well as with her mother/foster mother who was at bedside, as noted in prior note.   Patient was on maximum pressors, inotropes, ventilator support.  Patient expired despite maximal therapy, was DNR at the time as discussed with her designated contact person and her family at bedside.       Pertinent Labs and Studies  Significant Diagnostic Studies CT ABDOMEN PELVIS WO CONTRAST  Result Date: 01/24/21 CLINICAL DATA:   Chest pain. Clinical concern for pleurisy or effusion. Acute, non localized abdominal pain. Lung infiltrates on a portable chest radiograph earlier today. Ehlers-Danlos syndrome. Mast cell activation syndrome. History of myasthenia gravis. EXAM: CT CHEST, ABDOMEN AND PELVIS WITHOUT CONTRAST TECHNIQUE: Multidetector CT imaging of the chest, abdomen and pelvis was performed following the standard protocol without IV contrast. COMPARISON:  Portable chest obtained earlier today. Abdomen and pelvis CT dated 07/09/2020. FINDINGS: CT CHEST FINDINGS Cardiovascular: Diffuse low density of the blood relative to the arterial walls. Normal sized heart. Right jugular catheter tip at the superior cavoatrial junction. Mediastinum/Nodes: No enlarged mediastinal, hilar, or axillary lymph nodes. Thyroid gland, trachea, and esophagus demonstrate no significant findings. Lungs/Pleura: Extensive patchy/nodular opacities throughout both lungs, greater on the right. Areas of dense consolidation with air bronchograms and cavitation right upper lobe. Small right pleural effusion. Musculoskeletal: Unremarkable bones. CT ABDOMEN PELVIS FINDINGS Hepatobiliary: No focal liver abnormality is seen. Status post cholecystectomy. No biliary dilatation. Pancreas: Unremarkable. No pancreatic ductal dilatation or surrounding inflammatory changes. Spleen: Normal in size without focal abnormality. Adrenals/Urinary Tract: Stable small mid right renal calculus measuring 4 mm on image 64/3. Unremarkable left kidney, ureters, urinary bladder and adrenal glands. Stomach/Bowel: Percutaneous gastrojejunal tube with its tip in the jejunum in the upper right pelvis. No bowel dilatation. No bowel or gastric dilatation. Normal appearing appendix. Vascular/Lymphatic: No significant vascular findings are present. No enlarged abdominal or pelvic lymph nodes. Reproductive: Uterus and bilateral adnexa are unremarkable. Other: Interval marked cachexia. Musculoskeletal:  The hands are included with arachnodactyly demonstrated, compatible with the history of Ehlers-Danlos syndrome. No acute bony abnormality. IMPRESSION: 1. Extensive patchy/nodular opacities throughout both lungs,  greater on the right with areas of dense consolidation and cavitation in the right upper lobe. These are compatible with extensive changes due to atypical infection with right upper lobe cavitary pneumonia. 2. Stable small, nonobstructing right renal calculus. 3. Diffuse low density of the blood relative to the arterial walls. This can be seen with anemia. Electronically Signed   By: Beckie Salts M.D.   On: 01/02/2021 14:20   CT Head Wo Contrast  Result Date: 01/05/2021 CLINICAL DATA:  Mental status change. EXAM: CT HEAD WITHOUT CONTRAST TECHNIQUE: Contiguous axial images were obtained from the base of the skull through the vertex without intravenous contrast. COMPARISON:  None. FINDINGS: Scan positioning is suboptimal with mild motion artifact over the posterior fossa. Brain: Ventricles, cisterns and other CSF spaces are within normal. There is no mass, mass effect, shift of midline structures or acute hemorrhage. Vascular: No hyperdense vessel or unexpected calcification. Skull: Normal. Negative for fracture or focal lesion. Sinuses/Orbits: Orbits are normal. Mild opacification over the mastoid air cells. Paranasal sinuses are clear. Other: None. IMPRESSION: 1. No acute findings. 2. Mild opacification over the mastoid air cells. Electronically Signed   By: Elberta Fortis M.D.   On: 01/13/2021 16:31   CT Chest Wo Contrast  Result Date: 01/04/2021 CLINICAL DATA:  Chest pain. Clinical concern for pleurisy or effusion. Acute, non localized abdominal pain. Lung infiltrates on a portable chest radiograph earlier today. Ehlers-Danlos syndrome. Mast cell activation syndrome. History of myasthenia gravis. EXAM: CT CHEST, ABDOMEN AND PELVIS WITHOUT CONTRAST TECHNIQUE: Multidetector CT imaging of the chest,  abdomen and pelvis was performed following the standard protocol without IV contrast. COMPARISON:  Portable chest obtained earlier today. Abdomen and pelvis CT dated 07/09/2020. FINDINGS: CT CHEST FINDINGS Cardiovascular: Diffuse low density of the blood relative to the arterial walls. Normal sized heart. Right jugular catheter tip at the superior cavoatrial junction. Mediastinum/Nodes: No enlarged mediastinal, hilar, or axillary lymph nodes. Thyroid gland, trachea, and esophagus demonstrate no significant findings. Lungs/Pleura: Extensive patchy/nodular opacities throughout both lungs, greater on the right. Areas of dense consolidation with air bronchograms and cavitation right upper lobe. Small right pleural effusion. Musculoskeletal: Unremarkable bones. CT ABDOMEN PELVIS FINDINGS Hepatobiliary: No focal liver abnormality is seen. Status post cholecystectomy. No biliary dilatation. Pancreas: Unremarkable. No pancreatic ductal dilatation or surrounding inflammatory changes. Spleen: Normal in size without focal abnormality. Adrenals/Urinary Tract: Stable small mid right renal calculus measuring 4 mm on image 64/3. Unremarkable left kidney, ureters, urinary bladder and adrenal glands. Stomach/Bowel: Percutaneous gastrojejunal tube with its tip in the jejunum in the upper right pelvis. No bowel dilatation. No bowel or gastric dilatation. Normal appearing appendix. Vascular/Lymphatic: No significant vascular findings are present. No enlarged abdominal or pelvic lymph nodes. Reproductive: Uterus and bilateral adnexa are unremarkable. Other: Interval marked cachexia. Musculoskeletal: The hands are included with arachnodactyly demonstrated, compatible with the history of Ehlers-Danlos syndrome. No acute bony abnormality. IMPRESSION: 1. Extensive patchy/nodular opacities throughout both lungs, greater on the right with areas of dense consolidation and cavitation in the right upper lobe. These are compatible with extensive  changes due to atypical infection with right upper lobe cavitary pneumonia. 2. Stable small, nonobstructing right renal calculus. 3. Diffuse low density of the blood relative to the arterial walls. This can be seen with anemia. Electronically Signed   By: Beckie Salts M.D.   On: 01/14/2021 14:20   DG CHEST PORT 1 VIEW  Result Date: 01/13/2021 CLINICAL DATA:  Check central line placement, history of bilateral pneumonia EXAM:  PORTABLE CHEST 1 VIEW COMPARISON:  Film from earlier in the same day. FINDINGS: Cardiac shadow is stable. Endotracheal tube, right chest wall port and gastric catheter are seen in satisfactory position. New left jugular central line is noted in the distal superior vena cava. No pneumothorax is noted. Bilateral airspace opacities are again identified stable from the previous exam consistent with multifocal pneumonia. IMPRESSION: Tubes and lines in satisfactory position.  No pneumothorax is noted. Multifocal pneumonia bilaterally. Electronically Signed   By: Alcide Clever M.D.   On: January 16, 2021 23:46   DG Chest Portable 1 View  Result Date: 2021/01/16 CLINICAL DATA:  Status post intubation. EXAM: PORTABLE CHEST 1 VIEW COMPARISON:  Earlier today. FINDINGS: Interval endotracheal tube in satisfactory position. Interval nasogastric tube with its tip in the midthoracic esophagus. Stable right jugular porta catheter. Extensive bilateral airspace opacity, remaining most confluent in the right upper lobe. This is mildly progressive on the left. Associated obscuration of right heart borders. Grossly normal sized heart. Unremarkable bones. Cholecystectomy clips. IMPRESSION: 1. Endotracheal tube in satisfactory position. 2. Nasogastric tube tip in the mid esophagus. It is recommended that this be advanced at least 25 cm. 3. Extensive bilateral pneumonia, unchanged on the right and increased on the left. Electronically Signed   By: Beckie Salts M.D.   On: 2021/01/16 16:04   DG Chest Portable 1  View  Result Date: Jan 16, 2021 CLINICAL DATA:  Tachycardia and generalized weakness. EXAM: PORTABLE CHEST 1 VIEW COMPARISON:  11/20/2020 FINDINGS: Right IJ Port-A-Cath unchanged. Interval development of multifocal airspace consolidation over the right lung worse in the right upper lobe. There is patchy hazy airspace opacification over the left midlung. No effusion. Cardiomediastinal silhouette and remainder of the exam is unchanged. IMPRESSION: Interval development of multifocal airspace process over the right lung and left midlung likely multifocal pneumonia. Electronically Signed   By: Elberta Fortis M.D.   On: 01/16/2021 08:49   ECHOCARDIOGRAM COMPLETE  Result Date: 2021/01/16    ECHOCARDIOGRAM REPORT   Patient Name:   ENNIS DELPOZO Date of Exam: 01/16/21 Medical Rec #:  [id number]          Height:       64.0 in Accession #:    [id number]         Weight:       70.0 lb Date of Birth:  12/14/1990          BSA:          1.252 m Patient Age:    30 years           BP:           107/61 mmHg Patient Gender: F                  HR:           121 bpm. Exam Location:  Inpatient Procedure: 2D Echo, Cardiac Doppler and Color Doppler Indications:    I50.21 Acute systolic (congestive) heart failure  History:        Patient has no prior history of Echocardiogram examinations.                 Signs/Symptoms:Shortness of Breath. Malnutrition.  Sonographer:    Roosvelt Maser RDCS Referring Phys: [id number] [facility] IMPRESSIONS  1. Left ventricular ejection fraction, by estimation, is 40 to 45%. The left ventricle has mildly decreased function. The left ventricle demonstrates global hypokinesis. Left ventricular diastolic parameters are consistent with Grade I diastolic dysfunction (impaired relaxation).  2. Right ventricular systolic function is normal. The right ventricular size is normal. Tricuspid regurgitation signal is inadequate for assessing PA pressure.  3. A small pericardial effusion is present. The pericardial  effusion is anterior to the right ventricle and surrounding the apex. There is no evidence of cardiac tamponade.  4. The mitral valve is normal in structure. No evidence of mitral valve regurgitation.  5. The aortic valve was not well visualized. Aortic valve regurgitation is not visualized. No aortic stenosis is present.  6. The inferior vena cava is normal in size with greater than 50% respiratory variability, suggesting right atrial pressure of 3 mmHg. Comparison(s): Technically difficult study limited by tachycardia and constant motion. Consider repeat study when the patient has a slower heart rate and is able to cooperate with imaging. FINDINGS  Left Ventricle: Left ventricular ejection fraction, by estimation, is 40 to 45%. The left ventricle has mildly decreased function. The left ventricle demonstrates global hypokinesis. The left ventricular internal cavity size was normal in size. There is  no left ventricular hypertrophy. Left ventricular diastolic parameters are consistent with Grade I diastolic dysfunction (impaired relaxation). Right Ventricle: The right ventricular size is normal. No increase in right ventricular wall thickness. Right ventricular systolic function is normal. Tricuspid regurgitation signal is inadequate for assessing PA pressure. Left Atrium: Left atrial size was normal in size. Right Atrium: Right atrial size was normal in size. Pericardium: A small pericardial effusion is present. The pericardial effusion is anterior to the right ventricle and surrounding the apex. There is no evidence of cardiac tamponade. Mitral Valve: The mitral valve is normal in structure. No evidence of mitral valve regurgitation. Tricuspid Valve: The tricuspid valve is grossly normal. Tricuspid valve regurgitation is not demonstrated. Aortic Valve: The aortic valve was not well visualized. Aortic valve regurgitation is not visualized. No aortic stenosis is present. Aortic valve mean gradient measures 2.0 mmHg.  Aortic valve peak gradient measures 4.2 mmHg. Aortic valve area, by VTI measures 3.00 cm. Pulmonic Valve: The pulmonic valve was not well visualized. Pulmonic valve regurgitation is not visualized. Aorta: The aortic root is normal in size and structure. Venous: The inferior vena cava is normal in size with greater than 50% respiratory variability, suggesting right atrial pressure of 3 mmHg. IAS/Shunts: No atrial level shunt detected by color flow Doppler.  LEFT VENTRICLE PLAX 2D LVIDd:         3.70 cm LVIDs:         2.80 cm LV PW:         1.00 cm LV IVS:        0.70 cm LVOT diam:     2.10 cm LV SV:         38 LV SV Index:   31 LVOT Area:     3.46 cm  RIGHT VENTRICLE RV Basal diam:  3.10 cm LEFT ATRIUM         Index LA diam:    2.10 cm 1.68 cm/m  AORTIC VALVE AV Area (Vmax):    2.99 cm AV Area (Vmean):   2.83 cm AV Area (VTI):     3.00 cm AV Vmax:           102.00 cm/s AV Vmean:          71.300 cm/s AV VTI:            0.128 m AV Peak Grad:      4.2 mmHg AV Mean Grad:      2.0 mmHg LVOT  Vmax:         88.00 cm/s LVOT Vmean:        58.200 cm/s LVOT VTI:          0.111 m LVOT/AV VTI ratio: 0.87  AORTA Ao Root diam: 3.10 cm  SHUNTS Systemic VTI:  0.11 m Systemic Diam: 2.10 cm Rachelle Hora Croitoru MD Electronically signed by Thurmon Fair MD Signature Date/Time: 12/22/2020/3:08:18 PM    Final     Microbiology Recent Results (from the past 240 hour(s))  Blood culture (routine x 2)     Status: None (Preliminary result)   Collection Time: 01/04/2021  8:05 AM   Specimen: BLOOD  Result Value Ref Range Status   Specimen Description BLOOD PORTA CATH  Final   Special Requests   Final    BOTTLES DRAWN AEROBIC AND ANAEROBIC Blood Culture adequate volume   Culture  Setup Time   Final    GRAM NEGATIVE RODS IN BOTH AEROBIC AND ANAEROBIC BOTTLES Organism ID to follow CRITICAL RESULT CALLED TO, READ BACK BY AND VERIFIED WITH: PHARMD LYDIA CHEN BY MESSAN H. AT 2120 ON 7 10 2022 Performed at [facility], [address], [place], [place] [address]    Culture GRAM NEGATIVE RODS  Final   Report Status PENDING  Incomplete  Blood Culture ID Panel (Reflexed)     Status: Abnormal   Collection Time: 01/06/2021  8:05 AM  Result Value Ref Range Status   Enterococcus faecalis NOT DETECTED NOT DETECTED Final   Enterococcus Faecium NOT DETECTED NOT DETECTED Final   Listeria monocytogenes NOT DETECTED NOT DETECTED Final   Staphylococcus species NOT DETECTED NOT DETECTED Final   Staphylococcus aureus (BCID) NOT DETECTED NOT DETECTED Final   Staphylococcus epidermidis NOT DETECTED NOT DETECTED Final   Staphylococcus lugdunensis NOT DETECTED NOT DETECTED Final   Streptococcus species NOT DETECTED NOT DETECTED Final   Streptococcus agalactiae NOT DETECTED NOT DETECTED Final   Streptococcus pneumoniae NOT DETECTED NOT DETECTED Final   Streptococcus pyogenes NOT DETECTED NOT DETECTED Final   A.calcoaceticus-baumannii NOT DETECTED NOT DETECTED Final   Bacteroides fragilis NOT DETECTED NOT DETECTED Final   Enterobacterales DETECTED (A) NOT DETECTED Final    Comment: Enterobacterales represent a large order of gram negative bacteria, not a single organism. Refer to culture for further identification. CRITICAL RESULT CALLED TO, READ BACK BY AND VERIFIED WITH: PHARMD LYDIA CHEN BY MESSAN H. AT 2120 ON 7 10 2022    Enterobacter cloacae complex NOT DETECTED NOT DETECTED Final   Escherichia coli NOT DETECTED NOT DETECTED Final   Klebsiella aerogenes NOT DETECTED NOT DETECTED Final   Klebsiella oxytoca NOT DETECTED NOT DETECTED Final   Klebsiella pneumoniae NOT DETECTED NOT DETECTED Final   Proteus species NOT DETECTED NOT DETECTED Final   Salmonella species NOT DETECTED NOT DETECTED Final   Serratia marcescens NOT DETECTED NOT DETECTED Final   Haemophilus influenzae NOT DETECTED NOT DETECTED Final   Neisseria meningitidis NOT DETECTED NOT DETECTED Final   Pseudomonas aeruginosa DETECTED (A) NOT DETECTED Final    Comment:  CRITICAL RESULT CALLED TO, READ BACK BY AND VERIFIED WITH: PHARMD LYDIA CHEN BY MESSAN H. AT 2120 ON 7 10 2022    Stenotrophomonas maltophilia NOT DETECTED NOT DETECTED Final   Candida albicans NOT DETECTED NOT DETECTED Final   Candida auris NOT DETECTED NOT DETECTED Final   Candida glabrata NOT DETECTED NOT DETECTED Final   Candida krusei NOT DETECTED NOT DETECTED Final   Candida parapsilosis NOT DETECTED NOT  DETECTED Final   Candida tropicalis NOT DETECTED NOT DETECTED Final   Cryptococcus neoformans/gattii NOT DETECTED NOT DETECTED Final   CTX-M ESBL NOT DETECTED NOT DETECTED Final   Carbapenem resistance IMP NOT DETECTED NOT DETECTED Final   Carbapenem resistance KPC NOT DETECTED NOT DETECTED Final   Carbapenem resistance NDM NOT DETECTED NOT DETECTED Final   Carbapenem resist OXA 48 LIKE NOT DETECTED NOT DETECTED Final   Carbapenem resistance VIM NOT DETECTED NOT DETECTED Final    Comment: Performed at [facility], [address], [place], [place] [address]  Resp Panel by RT-PCR (Flu A&B, Covid) Nasopharyngeal Swab     Status: None   Collection Time: 01/09/2021  9:06 AM   Specimen: Nasopharyngeal Swab; Nasopharyngeal(NP) swabs in vial transport medium  Result Value Ref Range Status   SARS Coronavirus 2 by RT PCR NEGATIVE NEGATIVE Final    Comment: (NOTE) SARS-CoV-2 target nucleic acids are NOT DETECTED.  The SARS-CoV-2 RNA is generally detectable in upper respiratory specimens during the acute phase of infection. The lowest concentration of SARS-CoV-2 viral copies this assay can detect is 138 copies/mL. A negative result does not preclude SARS-Cov-2 infection and should not be used as the sole basis for treatment or other patient management decisions. A negative result may occur with  improper specimen collection/handling, submission of specimen other than nasopharyngeal swab, presence of viral mutation(s) within the areas targeted by this assay, and inadequate number of  viral copies(<138 copies/mL). A negative result must be combined with clinical observations, patient history, and epidemiological information. The expected result is Negative.  Fact Sheet for Patients:  BloggerCourse.com  Fact Sheet for Healthcare Providers:  SeriousBroker.it  This test is no t yet approved or cleared by the [place] FDA and  has been authorized for detection and/or diagnosis of SARS-CoV-2 by FDA under an Emergency Use Authorization (EUA). This EUA will remain  in effect (meaning this test can be used) for the duration of the COVID-19 declaration under Section 564(b)(1) of the Act, 21 U.S.C.section 360bbb-3(b)(1), unless the authorization is terminated  or revoked sooner.       Influenza A by PCR NEGATIVE NEGATIVE Final   Influenza B by PCR NEGATIVE NEGATIVE Final    Comment: (NOTE) The Xpert Xpress SARS-CoV-2/FLU/RSV plus assay is intended as an aid in the diagnosis of influenza from Nasopharyngeal swab specimens and should not be used as a sole basis for treatment. Nasal washings and aspirates are unacceptable for Xpert Xpress SARS-CoV-2/FLU/RSV testing.  Fact Sheet for Patients: BloggerCourse.com  Fact Sheet for Healthcare Providers: SeriousBroker.it  This test is not yet approved or cleared by the [place] FDA and has been authorized for detection and/or diagnosis of SARS-CoV-2 by FDA under an Emergency Use Authorization (EUA). This EUA will remain in effect (meaning this test can be used) for the duration of the COVID-19 declaration under Section 564(b)(1) of the Act, 21 U.S.C. section 360bbb-3(b)(1), unless the authorization is terminated or revoked.  Performed at [facility], [address], [place], [place] [address]   Respiratory (~20 pathogens) panel by PCR     Status: None   Collection Time: 01/07/2021  5:40 PM   Specimen:  Nasopharyngeal Swab; Respiratory  Result Value Ref Range Status   Adenovirus NOT DETECTED NOT DETECTED Final   Coronavirus 229E NOT DETECTED NOT DETECTED Final    Comment: (NOTE) The Coronavirus on the Respiratory Panel, DOES NOT test for the novel  Coronavirus (2019 nCoV)    Coronavirus  HKU1 NOT DETECTED NOT DETECTED Final   Coronavirus NL63 NOT DETECTED NOT DETECTED Final   Coronavirus OC43 NOT DETECTED NOT DETECTED Final   Metapneumovirus NOT DETECTED NOT DETECTED Final   Rhinovirus / Enterovirus NOT DETECTED NOT DETECTED Final   Influenza A NOT DETECTED NOT DETECTED Final   Influenza B NOT DETECTED NOT DETECTED Final   Parainfluenza Virus 1 NOT DETECTED NOT DETECTED Final   Parainfluenza Virus 2 NOT DETECTED NOT DETECTED Final   Parainfluenza Virus 3 NOT DETECTED NOT DETECTED Final   Parainfluenza Virus 4 NOT DETECTED NOT DETECTED Final   Respiratory Syncytial Virus NOT DETECTED NOT DETECTED Final   Bordetella pertussis NOT DETECTED NOT DETECTED Final   Bordetella Parapertussis NOT DETECTED NOT DETECTED Final   Chlamydophila pneumoniae NOT DETECTED NOT DETECTED Final   Mycoplasma pneumoniae NOT DETECTED NOT DETECTED Final    Comment: Performed at [facility], [address], [place], [place] [address]  MRSA Next Gen by PCR, Nasal     Status: None   Collection Time: 23-Jan-2021  9:03 PM   Specimen: Nasal Mucosa; Nasal Swab  Result Value Ref Range Status   MRSA by PCR Next Gen NOT DETECTED NOT DETECTED Final    Comment: (NOTE) The GeneXpert MRSA Assay (FDA approved for NASAL specimens only), is one component of a comprehensive MRSA colonization surveillance program. It is not intended to diagnose MRSA infection nor to guide or monitor treatment for MRSA infections. Test performance is not FDA approved in patients less than 16 years old. Performed at [facility], [address] [place], [place] [address]     Lab Basic Metabolic Panel: Recent Labs  Lab  01-23-2021 0818 23-Jan-2021 0826 January 23, 2021 1700 01/23/2021 1754 23-Jan-2021 1911 01/23/2021 2107 01-23-2021 2231 01/17/2021 0058  NA 133* 132*  132*  --  133*  --  134* 140 149*  K 3.0* 3.0*  3.0*  --  3.0*  --  3.2* 2.6* 3.1*  CL 95* 93*  --   --   --   --   --   --   CO2 22  --   --   --   --   --   --   --   GLUCOSE 98 89  --   --   --   --   --   --   BUN 17 15  --   --   --   --   --   --   CREATININE 0.91 0.80  --   --   --   --   --   --   CALCIUM 7.7*  --   --   --   --   --   --   --   MG  --   --  1.7  --  1.6*  --   --   --   PHOS  --   --  3.9  --  4.0  --   --   --    Liver Function Tests: Recent Labs  Lab Jan 23, 2021 0818  AST 20  ALT 12  ALKPHOS 36*  BILITOT 2.3*  PROT 4.5*  ALBUMIN 1.7*   Recent Labs  Lab 01/23/2021 0818  LIPASE 15   No results for input(s): AMMONIA in the last 168 hours. CBC: Recent Labs  Lab 2021-01-23 0818 01-23-21 0826 01/23/21 2013 01-23-21 2107 2021-01-23 2231 January 23, 2021 2301 01/16/2021 0058  WBC 4.6  --  1.0*  --   --  0.8*  --   NEUTROABS 4.0  --  0.7*  --   --  0.4*  --   HGB 10.3*   < > 7.9* 8.2* 6.1* 7.7* 6.5*  HCT 33.0*   < > 26.0* 24.0* 18.0* 26.1* 19.0*  MCV 88.5  --  93.2  --   --  94.2  --   PLT 60*  --  23*  --   --  16*  --    < > = values in this interval not displayed.   Cardiac Enzymes: Recent Labs  Lab 2021-01-11 2013  CKTOTAL 36*   Sepsis Labs: Recent Labs  Lab January 11, 2021 0818 2021/01/11 1044 Jan 11, 2021 2013 11-Jan-2021 2301  WBC 4.6  --  1.0* 0.8*  LATICACIDVEN 4.7* 3.7* >11.0*  --     Procedures/Operations  Central line placement  Intubation (in emergency room)  Arterial line placement  EEG    Charlotte Sanes 12/21/2020, 8:16 AM

## 2021-01-18 NOTE — Progress Notes (Signed)
Pt BP and HR slowly trended down despite maximum support. Pts foster mother, [name] ,at bedside and aware. Pt already made a DNR by MD earlier.   Pt passed, time of death 0333. This was verified by Tory Emerald, RN and Natasha Bence, RN.  Dr Marcos Eke made aware of pts passing via secure chat.  [facility] called and referral made. Referral umber [id number].

## 2021-01-18 NOTE — Progress Notes (Signed)
Pharmacy Antibiotic Note  Brandy [name] is a 30 y.o. female admitted on January 24, 2021 with sepsis.  Pharmacy has been consulted for vancomycin and cefepime dosing.  Pt anuric and now being started on CRRT for mixed respiratory / anion gap metabolic acidosis requiring many doses of bicarb.  Plan: Change vancomycin to 500mg  IV Q24H; pt has vanc listed as one of 50 allergies with no reaction recorded, rec'd vanc in ED without reaction, will run doses slowly as this corrects many intolerances to vanc and f/u whether any reactions do occur. Change cefepime to 2g IV Q12H.  Height: 5\' 1"  (154.9 cm) Weight: 45.4 kg (100 lb) IBW/kg (Calculated) : 47.8  Temp (24hrs), Avg:96 F (35.6 C), Min:94.1 F (34.5 C), Max:98.1 F (36.7 C)  Recent Labs  Lab January 24, 2021 0818 Jan 24, 2021 0826 24-Jan-2021 1044 24-Jan-2021 2013 24-Jan-2021 2301  WBC 4.6  --   --  1.0* 0.8*  CREATININE 0.91 0.80  --   --   --   LATICACIDVEN 4.7*  --  3.7* >11.0*  --     Estimated Creatinine Clearance: 74.4 mL/min (by C-G formula based on SCr of 0.8 mg/dL).    Allergies  Allergen Reactions   Aspirin Other (See Comments)    Unknown reaction   Nsaids Other (See Comments)    Unknown reaction   Quinolones Other (See Comments)    Unknown reaction   Sulfa Antibiotics Other (See Comments)    Unknown reaction    Telithromycin Other (See Comments)    Unknown reaction    Fludrocortisone Other (See Comments)    Unknown reaction    Ambien [Zolpidem] Other (See Comments)    Unknown reaction   Amitriptyline Other (See Comments)    Unknown reaction    Beef-Potatoes-Spinach [Compleat] Other (See Comments)    Unknown reaction    Benadryl [Diphenhydramine] Swelling    Tongue swelling - reaction to tablets and liquid - tolerates injections and benadryl cream   Botulinum Toxins Other (See Comments)    Unknown reaction    Chlorhexidine Hives   Ciprofloxacin Other (See Comments)    Unknown reaction    Codeine Other (See Comments)     Unknown reaction    Contrast Media [Iodinated Diagnostic Agents] Other (See Comments)    Unknown reaction    Creon [Pancrelipase (Lip-Prot-Amyl)] Other (See Comments)    Unknown reaction    Cymbalta [Duloxetine Hcl] Other (See Comments)    Unknown reaction    Dextrans Other (See Comments)    Unknown reaction    Dilaudid [Hydromorphone] Other (See Comments)    Severe migraine, SVT, chest pain   Ergotamine Other (See Comments)    Unknown reaction    Fluoxetine Other (See Comments)    Unknown reaction - reaction to All SSRIs    Gabapentin Other (See Comments)    Unknown reaction    Gallamine Other (See Comments)    Unknown reaction    Hydrocodone Other (See Comments)    Unknown reaction    Iodine Other (See Comments)    Unknown reaction    Lactose Intolerance (Gi) Other (See Comments)    Unknown reaction    Levofloxacin Other (See Comments)    Unknown reaction    Lunesta [Eszopiclone] Other (See Comments)    Unknown reaction    Macrolides And Ketolides Other (See Comments)    Unknown reaction    Maprotiline Other (See Comments)    Unknown reaction    Marplan [Isocarboxazid] Other (See Comments)    Any MAOI  contraindicated - unknown reaction   Midodrine Other (See Comments)    Unknown reaction    Morphine And Related Other (See Comments)    Severe migraine, SVT, chest pain, throat starts to close   Nitrous Oxide Other (See Comments)    Unknown reaction    Oxycodone Other (See Comments)    Unknown reaction    Pethidine [Meperidine] Other (See Comments)    Unknown reaction    Phenergan [Promethazine] Other (See Comments)    Unknown reaction    Phenobarbital Other (See Comments)    Unknown reaction    Pyridium [Phenazopyridine] Other (See Comments)    Unknown reaction    Reglan [Metoclopramide] Other (See Comments)    Unknown reaction    Remeron [Mirtazapine] Other (See Comments)    Noradrenergic antagonist   Salicylates Other (See Comments)     Unknown reaction    Savella [Milnacipran] Other (See Comments)    Unknown reaction    Scopolamine Other (See Comments)    Unknown reaction    Sumatriptan Other (See Comments)    Unknown reaction    Tape Other (See Comments)    Unknown reaction    Topiramate Other (See Comments)    Unknown reaction    Tricyclic Antidepressants Other (See Comments)    Unknown reaction    Tums [Calcium Carbonate] Nausea And Vomiting, Swelling and Other (See Comments)    Tongue swelling, migraine, heartburn   Vancomycin Other (See Comments)    Unknown reaction    Verapamil Other (See Comments)    Unknown reaction      Thank you for allowing pharmacy to be a part of this patient's care.  Vernard Gambles, PharmD, BCPS  01/11/2021 2:29 AM

## 2021-01-18 NOTE — CV Procedure (Addendum)
Arterial Catheter Insertion Procedure Note  Brandy [name]  [id number]  1991-01-19  Date:01/08/2021  Time:8:03 AM    Provider Performing: Charlotte Sanes    Procedure: Insertion of Arterial Line (98264) with US guidance (15830)   Indication(s) Blood pressure monitoring and/or need for frequent ABGs  Consent Unable to obtain consent due to emergent nature of procedure.  Anesthesia Lidocaine 1%   Time Out Verified patient identification, verified procedure, site/side was marked, verified correct patient position, special equipment/implants available, medications/allergies/relevant history reviewed, required imaging and test results available.   Sterile Technique Maximal sterile technique including full sterile barrier drape, hand hygiene, sterile gown, sterile gloves, mask, hair covering, sterile ultrasound probe cover (if used).   Procedure Description Area of catheter insertion was cleaned with chlorhexidine and draped in sterile fashion. With real-time ultrasound guidance an arterial catheter was placed into the right femoral artery.  Appropriate arterial tracings confirmed on monitor.     Complications/Tolerance None; patient tolerated the procedure well.   EBL Minimal   Specimen(s) None

## 2021-01-18 NOTE — Progress Notes (Signed)
Post arrest course complicated by severe hypoxemic hypercarbic respiratory failure, septic shock, acidemia, DIC, anuric renal failure, severe encephalopathy (unresponsive, only breathing over ventilator).  Arterial line placed, triple lumen placed for access.  Persistent ventilatory failure, lactic acidosis, PH remained quite low.  Intermittent severe drops in BP (despite epi infusion, levophed, and vaso), responsive to NaHCO3 pushes.  Initiated CRRT with hopes of improving acidemia and thus hemodynamics.  Discussed dire situation, multiple organ failure with [facility] who is listed as her contact from her most recent hospitalization.  Her foster mom/mother is also at bedside.  Recommended DNR to [facility] given her multiorgan failure and very unlikely meaningful recovery if she should again develop cardiac arrest.  Agreed to DNR.  Foster mom/Mother at bedside also agreed with DNR, though she is not listed as a contact in the patients chart from prior hospitalization.

## 2021-01-18 NOTE — Procedures (Signed)
Patient Name: Brandy [name]  MRN: [id number]  Epilepsy Attending: Charlsie Quest  Referring Physician/Provider: Dr. Erick Blinks Duration: 12/25/2020 2145 to 19-Jan-2021 0336   Patient history: 30 year old female admitted with multifocal pneumonia brief PEA arrest like to help with CPR.  EEG to evaluate for seizures.   Level of alertness: Comatose   AEDs during EEG study: None   Technical aspects: This EEG study was done with scalp electrodes positioned according to the 10-20 International system of electrode placement. Electrical activity was acquired at a sampling rate of 500Hz  and reviewed with a high frequency filter of 70Hz  and a low frequency filter of 1Hz . EEG data were recorded continuously and digitally stored.   Description: EEG initially showed continuous generalized background attenuation.  EEG was not reactive to tactile stimulation.  On 01/19/21 at around 0332, EKG strip showed bradycardia which eventually progressed to asystole.  Hyperventilation and photic stimulation were not performed.      ABNORMALITY -Background attenuation, generalized   IMPRESSION: This study was initially suggestive of profound diffuse encephalopathy, nonspecific to etiology.  No seizures or epileptiform discharges were seen throughout the recording.  On Jan 19, 2021 at around 0332, EKG strip showed bradycardia which eventually progressed to asystole.   Brandy [name] 

## 2021-01-18 DEATH — deceased

## 2021-02-16 MED FILL — Medication: Qty: 1 | Status: AC

## 2021-03-04 IMAGING — DX DG CHEST 1V PORT
1 series · 1 of 1 positions shown · non-contrast
Comparison: None.

CLINICAL DATA: Shortness of breath

EXAM:
PORTABLE CHEST 1 VIEW

[chest ap]
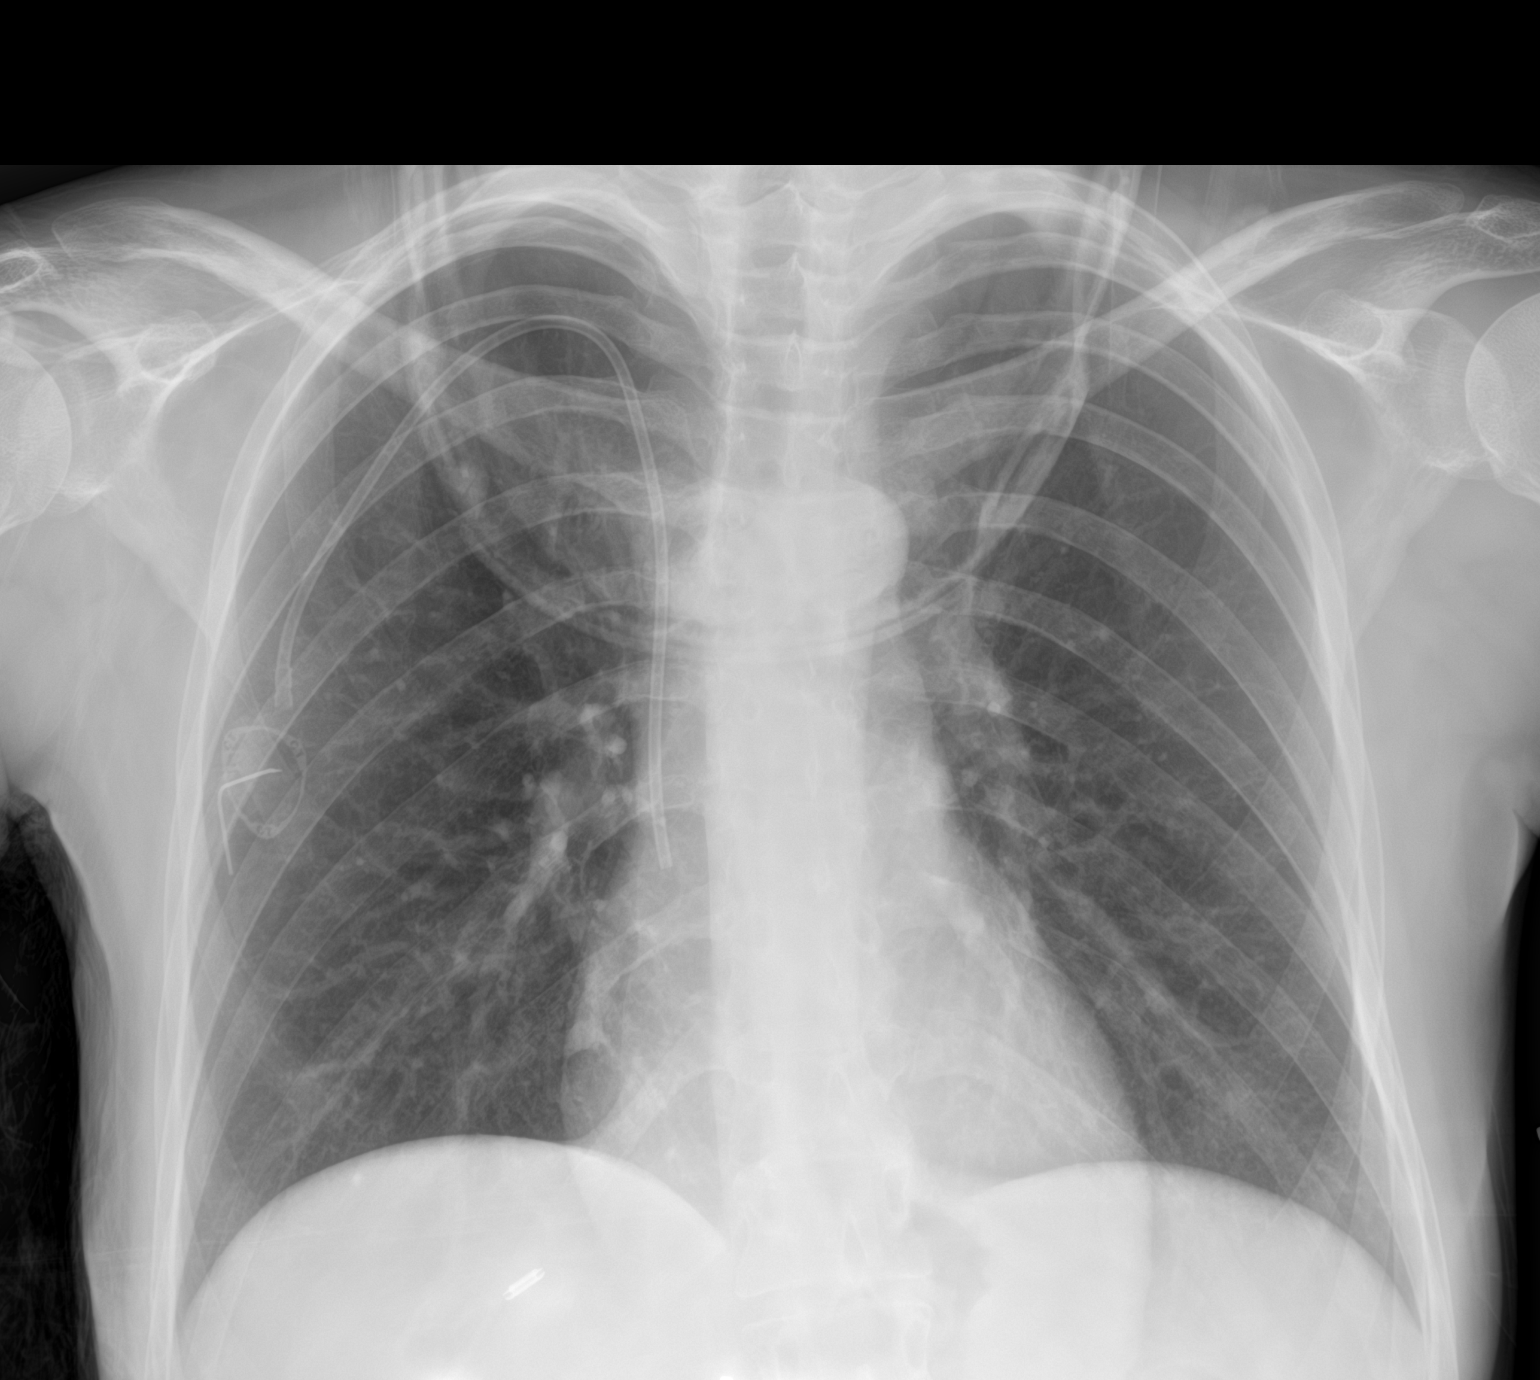

[1 of 1 positions shown; findings below may reference images not displayed]

FINDINGS: The heart size and mediastinal contours are within normal limits. A
right-sided MediPort catheter seen with the tip at the superior
cavoatrial junction. Both lungs are clear. The visualized skeletal
structures are unremarkable.
IMPRESSION: No active disease.

## 2021-03-04 IMAGING — CT CT ABD-PELV W/O CM
2 of 4 series · 16 of 46 positions shown, 18 images · non-contrast
Comparison: None.

CLINICAL DATA: Pain around a tube site.

EXAM:
CT ABDOMEN AND PELVIS WITHOUT CONTRAST
TECHNIQUE: Multidetector CT imaging of the abdomen and pelvis was performed
following the standard protocol without IV contrast.

[Series 2: a/p w/o 5mm · axial · non-contrast · 0.66mm/px · z∈[+1017,+1387]mm · 13 of 82 slices shown, 15 images]
[im 4/82  soft-tissue]
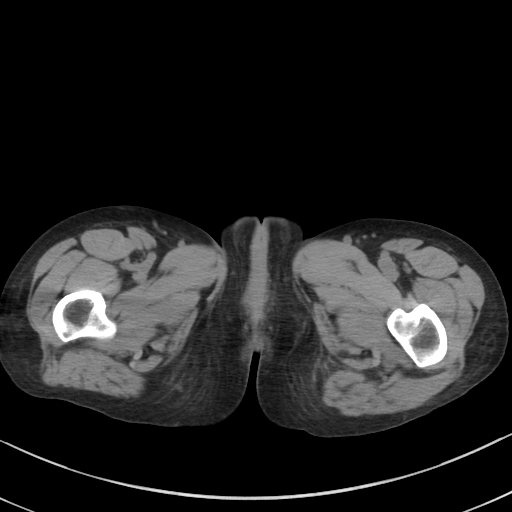
[im 4/82  bone]
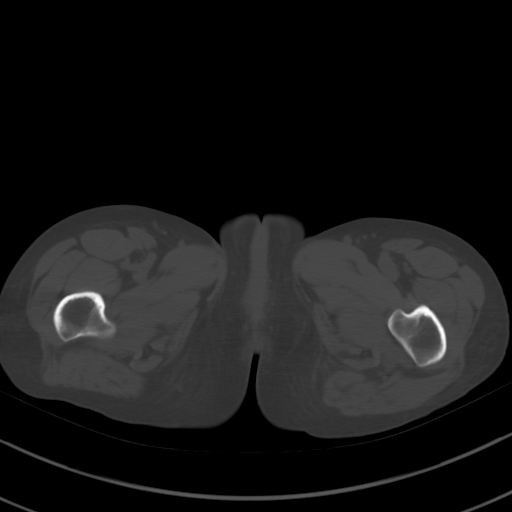
[im 11/82  soft-tissue]
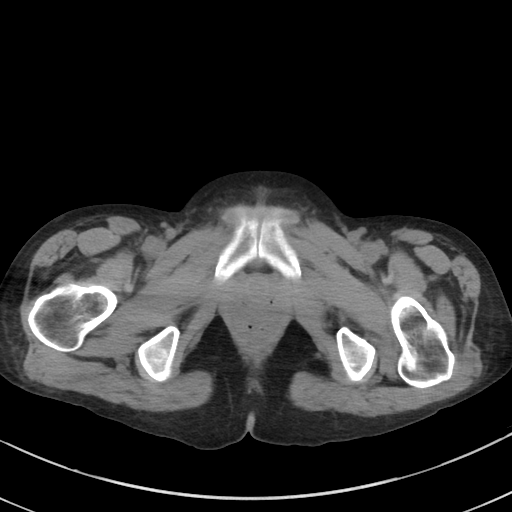
[im 17/82  soft-tissue]
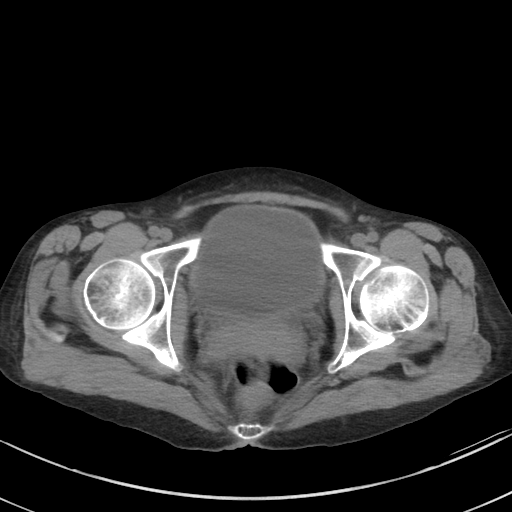
[im 24/82  soft-tissue]
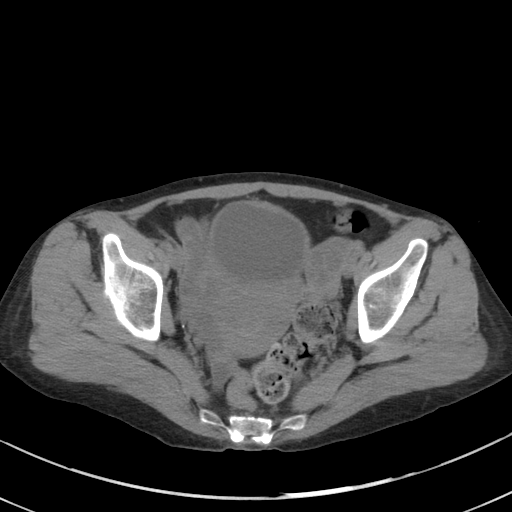
[im 28/82  soft-tissue]
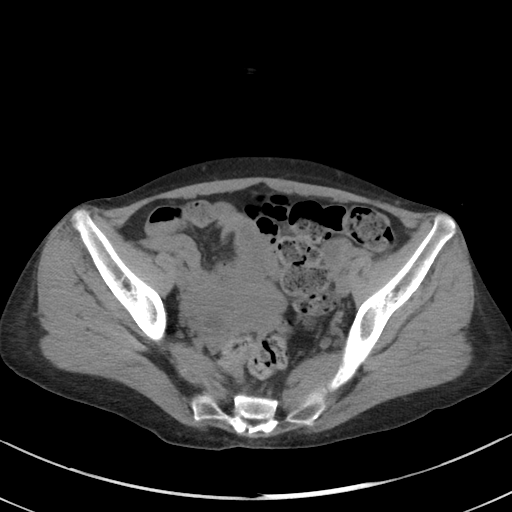
[im 34/82  soft-tissue]
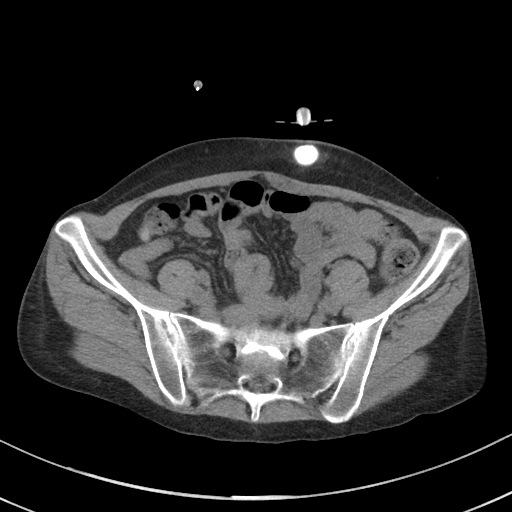
[im 41/82  soft-tissue]
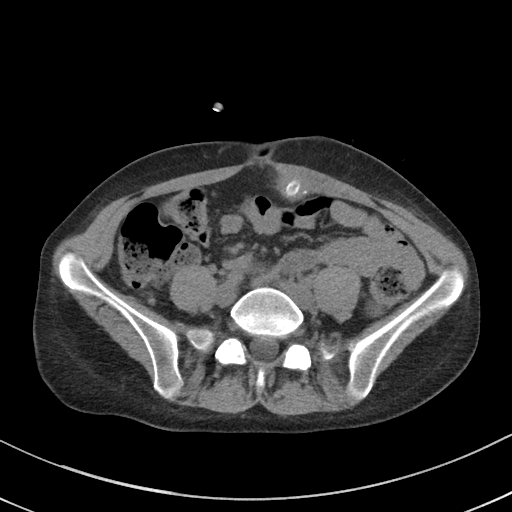
[im 48/82  soft-tissue]
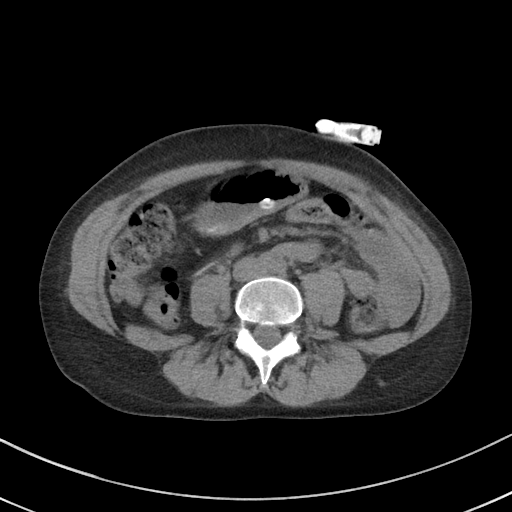
[im 55/82  soft-tissue]
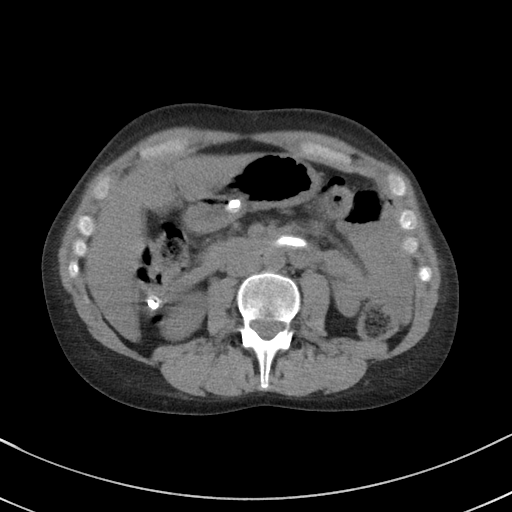
[im 55/82  bone]
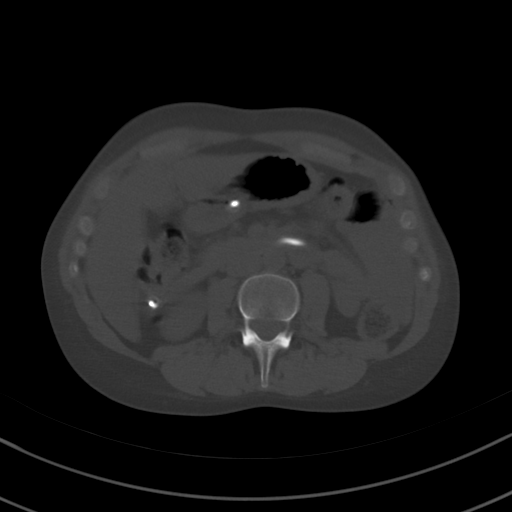
[im 58/82  soft-tissue]
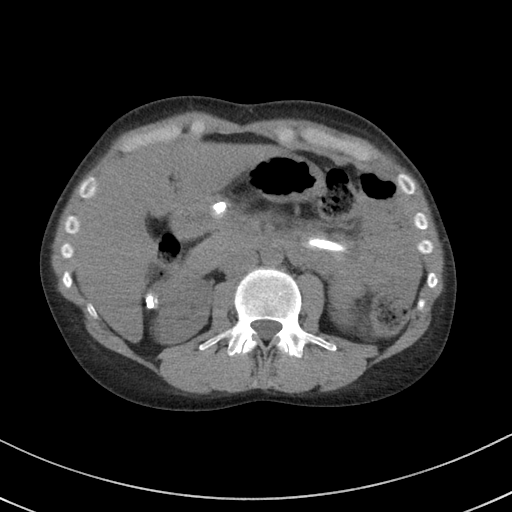
[im 65/82  soft-tissue]
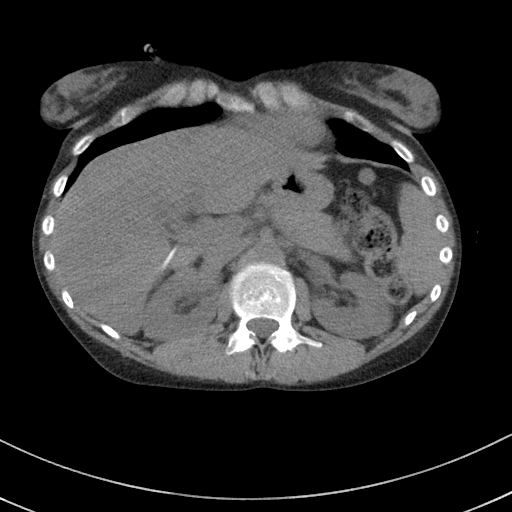
[im 71/82  soft-tissue]
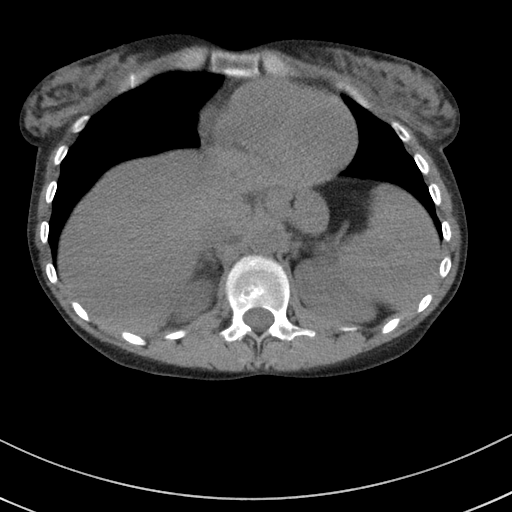
[im 78/82  soft-tissue]
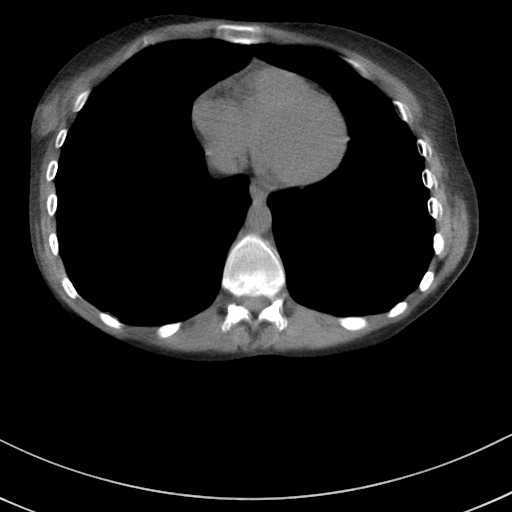

[Series 5: a/p w/o cor · coronal · non-contrast · 0.79mm/px · 3 of 133 slices shown]
[im 45/133  soft-tissue]
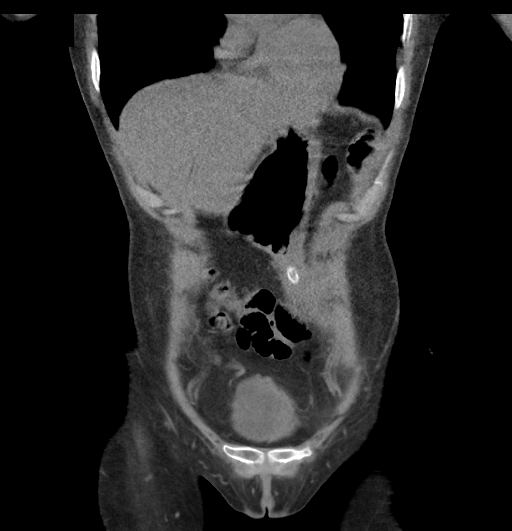
[im 59/133  soft-tissue]
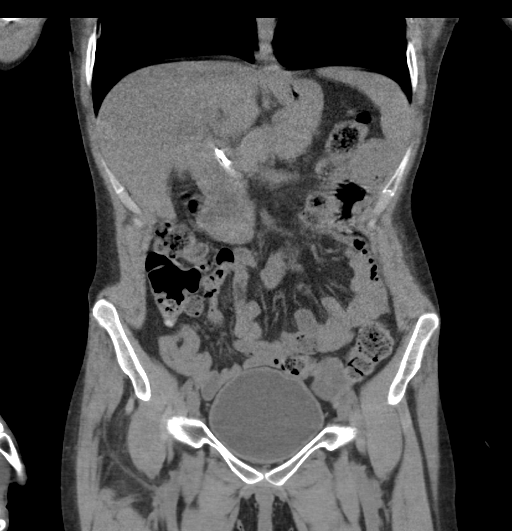
[im 74/133  soft-tissue]
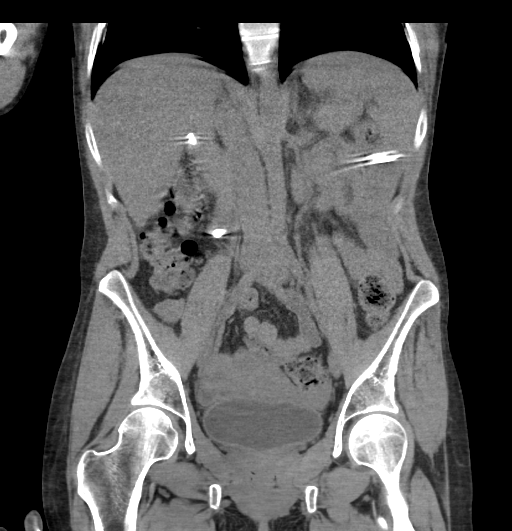

[16 of 46 positions shown; findings below may reference images not displayed]

FINDINGS: Lower chest: The lung bases are clear. The heart size is normal. The
intracardiac blood pool is hypodense relative to the adjacent
myocardium consistent with anemia.

Hepatobiliary: The liver is normal. Status post
cholecystectomy.There is no biliary ductal dilation.

Pancreas: Normal contours without ductal dilatation. No
peripancreatic fluid collection.

Spleen: Unremarkable.

Adrenals/Urinary Tract:

--Adrenal glands: Unremarkable.

--Right kidney/ureter: There appears to be a nonobstructing stone in
the interpolar region of the right kidney.

--Left kidney/ureter: No hydronephrosis or radiopaque kidney stones.

--Urinary bladder: Unremarkable.

Stomach/Bowel:

--Stomach/Duodenum: There is a gastrojejunostomy tube in place. The
retention bulb has retracted and now resides within the subcutaneous
soft tissues. Repositioning is recommended. The tube is otherwise
well position with the tip terminating in the proximal jejunum.

--Small bowel: Unremarkable.

--Colon: Unremarkable.

--Appendix: Normal.

Vascular/Lymphatic: Normal course and caliber of the major abdominal
vessels.

--No retroperitoneal lymphadenopathy.

--No mesenteric lymphadenopathy.

--No pelvic or inguinal lymphadenopathy.

Reproductive: Both ovaries appear slightly enlarged with multiple
follicles or cysts, not well evaluated in the absence of IV
contrast.

Other: There is a small volume of pelvic free fluid which is likely
physiologic. No free air. The abdominal wall is normal.

Musculoskeletal. No acute displaced fractures.
IMPRESSION: 1. There is a gastrojejunostomy tube in place. The retention bulb
has retracted and now resides within the subcutaneous soft tissues.
Repositioning is recommended. The tube is otherwise well positioned
with the tip terminating in the proximal jejunum.
2. Both ovaries appear slightly enlarged with multiple follicles or
cysts, not well evaluated in the absence of IV contrast. Consider
PCOS.
3. Nonobstructive right nephrolithiasis.
4. Anemia.
